# Patient Record
Sex: Female | Born: 1935 | Race: White | Hispanic: No | Smoking: Never smoker
Health system: Southern US, Community
[De-identification: ages and names within clinical notes are randomized; demographics above are authoritative.]

## PROBLEM LIST (undated history)

## (undated) DIAGNOSIS — I10 Essential (primary) hypertension: Secondary | ICD-10-CM

## (undated) DIAGNOSIS — D649 Anemia, unspecified: Secondary | ICD-10-CM

## (undated) DIAGNOSIS — M1990 Unspecified osteoarthritis, unspecified site: Secondary | ICD-10-CM

## (undated) DIAGNOSIS — L97909 Non-pressure chronic ulcer of unspecified part of unspecified lower leg with unspecified severity: Secondary | ICD-10-CM

## (undated) DIAGNOSIS — K589 Irritable bowel syndrome without diarrhea: Secondary | ICD-10-CM

## (undated) DIAGNOSIS — I70229 Atherosclerosis of native arteries of extremities with rest pain, unspecified extremity: Secondary | ICD-10-CM

## (undated) DIAGNOSIS — I998 Other disorder of circulatory system: Secondary | ICD-10-CM

## (undated) DIAGNOSIS — E079 Disorder of thyroid, unspecified: Secondary | ICD-10-CM

## (undated) DIAGNOSIS — F329 Major depressive disorder, single episode, unspecified: Secondary | ICD-10-CM

## (undated) DIAGNOSIS — G629 Polyneuropathy, unspecified: Secondary | ICD-10-CM

## (undated) DIAGNOSIS — I83009 Varicose veins of unspecified lower extremity with ulcer of unspecified site: Secondary | ICD-10-CM

## (undated) DIAGNOSIS — F32A Depression, unspecified: Secondary | ICD-10-CM

## (undated) DIAGNOSIS — R600 Localized edema: Secondary | ICD-10-CM

## (undated) HISTORY — DX: Polyneuropathy, unspecified: G62.9

## (undated) HISTORY — DX: Localized edema: R60.0

## (undated) HISTORY — PX: HIP SURGERY: SHX245

## (undated) HISTORY — DX: Atherosclerosis of native arteries of extremities with rest pain, unspecified extremity: I70.229

## (undated) HISTORY — DX: Unspecified osteoarthritis, unspecified site: M19.90

## (undated) HISTORY — PX: ABDOMINAL HYSTERECTOMY: SHX81

## (undated) HISTORY — PX: EYE SURGERY: SHX253

## (undated) HISTORY — DX: Irritable bowel syndrome, unspecified: K58.9

## (undated) HISTORY — PX: OTHER SURGICAL HISTORY: SHX169

## (undated) HISTORY — DX: Other disorder of circulatory system: I99.8

## (undated) HISTORY — DX: Anemia, unspecified: D64.9

## (undated) HISTORY — PX: NECK SURGERY: SHX720

## (undated) HISTORY — DX: Varicose veins of unspecified lower extremity with ulcer of unspecified site: L97.909

## (undated) HISTORY — DX: Varicose veins of unspecified lower extremity with ulcer of unspecified site: I83.009

---

## 2000-01-18 ENCOUNTER — Inpatient Hospital Stay (HOSPITAL_COMMUNITY): Admission: EM | Admit: 2000-01-18 | Discharge: 2000-01-19 | Payer: Self-pay | Admitting: Cardiology

## 2001-06-01 ENCOUNTER — Ambulatory Visit (HOSPITAL_COMMUNITY): Admission: RE | Admit: 2001-06-01 | Discharge: 2001-06-01 | Payer: Self-pay | Admitting: Obstetrics and Gynecology

## 2001-06-01 ENCOUNTER — Encounter: Payer: Self-pay | Admitting: Obstetrics and Gynecology

## 2001-09-15 ENCOUNTER — Other Ambulatory Visit: Admission: RE | Admit: 2001-09-15 | Discharge: 2001-09-15 | Payer: Self-pay | Admitting: Dermatology

## 2002-06-08 ENCOUNTER — Encounter: Payer: Self-pay | Admitting: Nephrology

## 2002-06-08 ENCOUNTER — Ambulatory Visit (HOSPITAL_COMMUNITY): Admission: RE | Admit: 2002-06-08 | Discharge: 2002-06-08 | Payer: Self-pay | Admitting: Nephrology

## 2003-02-26 ENCOUNTER — Ambulatory Visit (HOSPITAL_COMMUNITY): Admission: RE | Admit: 2003-02-26 | Discharge: 2003-02-26 | Payer: Self-pay | Admitting: Internal Medicine

## 2003-04-18 ENCOUNTER — Emergency Department (HOSPITAL_COMMUNITY): Admission: EM | Admit: 2003-04-18 | Discharge: 2003-04-18 | Payer: Self-pay | Admitting: Emergency Medicine

## 2003-05-04 ENCOUNTER — Ambulatory Visit (HOSPITAL_COMMUNITY): Admission: RE | Admit: 2003-05-04 | Discharge: 2003-05-04 | Payer: Self-pay | Admitting: Internal Medicine

## 2003-05-18 ENCOUNTER — Ambulatory Visit (HOSPITAL_COMMUNITY): Admission: RE | Admit: 2003-05-18 | Discharge: 2003-05-18 | Payer: Self-pay | Admitting: Internal Medicine

## 2003-08-30 ENCOUNTER — Ambulatory Visit (HOSPITAL_COMMUNITY): Admission: RE | Admit: 2003-08-30 | Discharge: 2003-08-30 | Payer: Self-pay | Admitting: Internal Medicine

## 2004-02-16 ENCOUNTER — Inpatient Hospital Stay (HOSPITAL_COMMUNITY): Admission: EM | Admit: 2004-02-16 | Discharge: 2004-02-18 | Payer: Self-pay | Admitting: Emergency Medicine

## 2004-07-20 ENCOUNTER — Inpatient Hospital Stay (HOSPITAL_COMMUNITY): Admission: EM | Admit: 2004-07-20 | Discharge: 2004-07-23 | Payer: Self-pay | Admitting: Emergency Medicine

## 2004-07-23 ENCOUNTER — Encounter: Payer: Self-pay | Admitting: Cardiology

## 2004-11-13 ENCOUNTER — Ambulatory Visit (HOSPITAL_COMMUNITY): Admission: RE | Admit: 2004-11-13 | Discharge: 2004-11-13 | Payer: Self-pay | Admitting: Nephrology

## 2004-11-28 ENCOUNTER — Ambulatory Visit (HOSPITAL_COMMUNITY): Admission: RE | Admit: 2004-11-28 | Discharge: 2004-11-28 | Payer: Self-pay | Admitting: Nephrology

## 2005-02-02 ENCOUNTER — Emergency Department (HOSPITAL_COMMUNITY): Admission: EM | Admit: 2005-02-02 | Discharge: 2005-02-02 | Payer: Self-pay | Admitting: *Deleted

## 2005-02-04 ENCOUNTER — Ambulatory Visit: Payer: Self-pay | Admitting: *Deleted

## 2005-02-12 ENCOUNTER — Ambulatory Visit: Payer: Self-pay | Admitting: *Deleted

## 2005-02-19 ENCOUNTER — Ambulatory Visit (HOSPITAL_COMMUNITY): Admission: RE | Admit: 2005-02-19 | Discharge: 2005-02-19 | Payer: Self-pay | Admitting: *Deleted

## 2005-02-19 ENCOUNTER — Ambulatory Visit (HOSPITAL_COMMUNITY): Admission: RE | Admit: 2005-02-19 | Discharge: 2005-02-19 | Payer: Self-pay | Admitting: Nephrology

## 2005-02-28 ENCOUNTER — Emergency Department (HOSPITAL_COMMUNITY): Admission: EM | Admit: 2005-02-28 | Discharge: 2005-02-28 | Payer: Self-pay | Admitting: *Deleted

## 2005-03-18 ENCOUNTER — Encounter: Admission: RE | Admit: 2005-03-18 | Discharge: 2005-03-18 | Payer: Self-pay | Admitting: Neurology

## 2005-03-26 ENCOUNTER — Ambulatory Visit: Payer: Self-pay | Admitting: *Deleted

## 2005-04-07 ENCOUNTER — Ambulatory Visit: Payer: Self-pay | Admitting: Internal Medicine

## 2005-04-30 ENCOUNTER — Ambulatory Visit: Payer: Self-pay | Admitting: Internal Medicine

## 2005-04-30 ENCOUNTER — Ambulatory Visit (HOSPITAL_COMMUNITY): Admission: RE | Admit: 2005-04-30 | Discharge: 2005-04-30 | Payer: Self-pay | Admitting: Internal Medicine

## 2005-04-30 ENCOUNTER — Encounter (INDEPENDENT_AMBULATORY_CARE_PROVIDER_SITE_OTHER): Payer: Self-pay | Admitting: Internal Medicine

## 2005-06-04 ENCOUNTER — Ambulatory Visit (HOSPITAL_COMMUNITY): Admission: RE | Admit: 2005-06-04 | Discharge: 2005-06-04 | Payer: Self-pay | Admitting: *Deleted

## 2005-06-15 ENCOUNTER — Other Ambulatory Visit: Admission: RE | Admit: 2005-06-15 | Discharge: 2005-06-15 | Payer: Self-pay | Admitting: Dermatology

## 2005-06-17 ENCOUNTER — Ambulatory Visit (HOSPITAL_COMMUNITY): Admission: RE | Admit: 2005-06-17 | Discharge: 2005-06-17 | Payer: Self-pay | Admitting: *Deleted

## 2005-07-06 ENCOUNTER — Ambulatory Visit (HOSPITAL_COMMUNITY): Admission: RE | Admit: 2005-07-06 | Discharge: 2005-07-06 | Payer: Self-pay | Admitting: *Deleted

## 2005-11-30 ENCOUNTER — Ambulatory Visit (HOSPITAL_COMMUNITY): Admission: RE | Admit: 2005-11-30 | Discharge: 2005-11-30 | Payer: Self-pay | Admitting: Nephrology

## 2005-12-03 ENCOUNTER — Ambulatory Visit: Payer: Self-pay | Admitting: Family Medicine

## 2005-12-08 ENCOUNTER — Ambulatory Visit (HOSPITAL_COMMUNITY): Admission: RE | Admit: 2005-12-08 | Discharge: 2005-12-08 | Payer: Self-pay | Admitting: Family Medicine

## 2005-12-23 ENCOUNTER — Ambulatory Visit: Payer: Self-pay | Admitting: Family Medicine

## 2006-01-07 ENCOUNTER — Ambulatory Visit: Payer: Self-pay | Admitting: Family Medicine

## 2006-01-08 ENCOUNTER — Ambulatory Visit: Payer: Self-pay | Admitting: Family Medicine

## 2006-01-12 ENCOUNTER — Ambulatory Visit (HOSPITAL_COMMUNITY): Admission: RE | Admit: 2006-01-12 | Discharge: 2006-01-12 | Payer: Self-pay | Admitting: Family Medicine

## 2006-01-12 ENCOUNTER — Ambulatory Visit: Payer: Self-pay | Admitting: Family Medicine

## 2006-02-04 ENCOUNTER — Ambulatory Visit: Payer: Self-pay | Admitting: Family Medicine

## 2006-03-04 ENCOUNTER — Ambulatory Visit: Payer: Self-pay | Admitting: Family Medicine

## 2006-04-01 ENCOUNTER — Ambulatory Visit: Payer: Self-pay | Admitting: Family Medicine

## 2006-04-29 ENCOUNTER — Ambulatory Visit: Payer: Self-pay | Admitting: Family Medicine

## 2006-05-03 ENCOUNTER — Encounter (INDEPENDENT_AMBULATORY_CARE_PROVIDER_SITE_OTHER): Payer: Self-pay | Admitting: Family Medicine

## 2006-05-12 ENCOUNTER — Encounter (INDEPENDENT_AMBULATORY_CARE_PROVIDER_SITE_OTHER): Payer: Self-pay | Admitting: Family Medicine

## 2006-05-12 LAB — CONVERTED CEMR LAB: TSH: 2.603 microintl units/mL

## 2006-06-03 ENCOUNTER — Ambulatory Visit: Payer: Self-pay | Admitting: Family Medicine

## 2006-06-03 ENCOUNTER — Ambulatory Visit (HOSPITAL_COMMUNITY): Admission: RE | Admit: 2006-06-03 | Discharge: 2006-06-03 | Payer: Self-pay | Admitting: Family Medicine

## 2006-07-01 ENCOUNTER — Ambulatory Visit: Payer: Self-pay | Admitting: Internal Medicine

## 2006-07-08 ENCOUNTER — Ambulatory Visit: Payer: Self-pay | Admitting: Family Medicine

## 2006-07-09 ENCOUNTER — Ambulatory Visit (HOSPITAL_COMMUNITY): Admission: RE | Admit: 2006-07-09 | Discharge: 2006-07-09 | Payer: Self-pay | Admitting: Internal Medicine

## 2006-07-15 ENCOUNTER — Encounter (INDEPENDENT_AMBULATORY_CARE_PROVIDER_SITE_OTHER): Payer: Self-pay | Admitting: Family Medicine

## 2006-07-15 LAB — CONVERTED CEMR LAB
RBC count: 4.02 10*6/uL
WBC, blood: 5.3 10*3/uL

## 2006-08-05 ENCOUNTER — Ambulatory Visit: Payer: Self-pay | Admitting: Family Medicine

## 2006-09-09 ENCOUNTER — Encounter (INDEPENDENT_AMBULATORY_CARE_PROVIDER_SITE_OTHER): Payer: Self-pay | Admitting: Family Medicine

## 2006-09-10 ENCOUNTER — Ambulatory Visit: Payer: Self-pay | Admitting: Internal Medicine

## 2006-09-10 ENCOUNTER — Encounter (INDEPENDENT_AMBULATORY_CARE_PROVIDER_SITE_OTHER): Payer: Self-pay | Admitting: Family Medicine

## 2006-09-14 ENCOUNTER — Encounter: Payer: Self-pay | Admitting: Family Medicine

## 2006-09-14 DIAGNOSIS — I1310 Hypertensive heart and chronic kidney disease without heart failure, with stage 1 through stage 4 chronic kidney disease, or unspecified chronic kidney disease: Secondary | ICD-10-CM

## 2006-09-14 DIAGNOSIS — I10 Essential (primary) hypertension: Secondary | ICD-10-CM | POA: Insufficient documentation

## 2006-09-14 DIAGNOSIS — H5000 Unspecified esotropia: Secondary | ICD-10-CM | POA: Insufficient documentation

## 2006-09-14 DIAGNOSIS — M810 Age-related osteoporosis without current pathological fracture: Secondary | ICD-10-CM | POA: Insufficient documentation

## 2006-09-14 DIAGNOSIS — I4891 Unspecified atrial fibrillation: Secondary | ICD-10-CM

## 2006-09-14 DIAGNOSIS — E039 Hypothyroidism, unspecified: Secondary | ICD-10-CM | POA: Insufficient documentation

## 2006-09-14 DIAGNOSIS — I2510 Atherosclerotic heart disease of native coronary artery without angina pectoris: Secondary | ICD-10-CM | POA: Insufficient documentation

## 2006-09-14 DIAGNOSIS — M4800 Spinal stenosis, site unspecified: Secondary | ICD-10-CM

## 2006-09-14 DIAGNOSIS — M545 Low back pain: Secondary | ICD-10-CM

## 2006-09-14 DIAGNOSIS — E785 Hyperlipidemia, unspecified: Secondary | ICD-10-CM | POA: Insufficient documentation

## 2006-09-14 DIAGNOSIS — H269 Unspecified cataract: Secondary | ICD-10-CM

## 2006-09-14 DIAGNOSIS — K219 Gastro-esophageal reflux disease without esophagitis: Secondary | ICD-10-CM

## 2006-09-14 DIAGNOSIS — D649 Anemia, unspecified: Secondary | ICD-10-CM

## 2006-09-14 DIAGNOSIS — K259 Gastric ulcer, unspecified as acute or chronic, without hemorrhage or perforation: Secondary | ICD-10-CM | POA: Insufficient documentation

## 2006-09-15 ENCOUNTER — Emergency Department (HOSPITAL_COMMUNITY): Admission: EM | Admit: 2006-09-15 | Discharge: 2006-09-15 | Payer: Self-pay | Admitting: Emergency Medicine

## 2006-09-22 ENCOUNTER — Ambulatory Visit: Payer: Self-pay | Admitting: Family Medicine

## 2006-09-29 ENCOUNTER — Emergency Department (HOSPITAL_COMMUNITY): Admission: EM | Admit: 2006-09-29 | Discharge: 2006-09-29 | Payer: Self-pay | Admitting: Emergency Medicine

## 2006-09-30 ENCOUNTER — Ambulatory Visit: Payer: Self-pay | Admitting: Family Medicine

## 2006-10-05 ENCOUNTER — Ambulatory Visit: Payer: Self-pay | Admitting: Family Medicine

## 2006-10-06 ENCOUNTER — Inpatient Hospital Stay: Admission: AD | Admit: 2006-10-06 | Discharge: 2006-10-21 | Payer: Self-pay | Admitting: Family Medicine

## 2006-10-06 ENCOUNTER — Encounter (INDEPENDENT_AMBULATORY_CARE_PROVIDER_SITE_OTHER): Payer: Self-pay | Admitting: Family Medicine

## 2006-10-07 ENCOUNTER — Encounter (INDEPENDENT_AMBULATORY_CARE_PROVIDER_SITE_OTHER): Payer: Self-pay | Admitting: Family Medicine

## 2006-10-12 ENCOUNTER — Ambulatory Visit (HOSPITAL_COMMUNITY): Admission: RE | Admit: 2006-10-12 | Discharge: 2006-10-12 | Payer: Self-pay | Admitting: Family Medicine

## 2006-10-12 ENCOUNTER — Ambulatory Visit: Payer: Self-pay | Admitting: Family Medicine

## 2006-10-28 ENCOUNTER — Ambulatory Visit: Payer: Self-pay | Admitting: Family Medicine

## 2006-10-29 ENCOUNTER — Ambulatory Visit: Payer: Self-pay | Admitting: Internal Medicine

## 2006-11-02 ENCOUNTER — Ambulatory Visit: Payer: Self-pay | Admitting: Family Medicine

## 2006-11-05 ENCOUNTER — Ambulatory Visit (HOSPITAL_COMMUNITY): Admission: RE | Admit: 2006-11-05 | Discharge: 2006-11-05 | Payer: Self-pay | Admitting: Family Medicine

## 2006-11-05 ENCOUNTER — Ambulatory Visit: Payer: Self-pay | Admitting: Family Medicine

## 2006-11-08 ENCOUNTER — Inpatient Hospital Stay (HOSPITAL_COMMUNITY): Admission: EM | Admit: 2006-11-08 | Discharge: 2006-11-15 | Payer: Self-pay | Admitting: Emergency Medicine

## 2006-11-08 ENCOUNTER — Ambulatory Visit: Payer: Self-pay | Admitting: Cardiology

## 2006-11-15 ENCOUNTER — Inpatient Hospital Stay: Admission: AD | Admit: 2006-11-15 | Discharge: 2006-11-25 | Payer: Self-pay | Admitting: Family Medicine

## 2006-11-15 ENCOUNTER — Encounter (INDEPENDENT_AMBULATORY_CARE_PROVIDER_SITE_OTHER): Payer: Self-pay | Admitting: Family Medicine

## 2006-11-23 ENCOUNTER — Ambulatory Visit: Payer: Self-pay | Admitting: Family Medicine

## 2006-11-25 ENCOUNTER — Inpatient Hospital Stay (HOSPITAL_COMMUNITY): Admission: EM | Admit: 2006-11-25 | Discharge: 2006-12-01 | Payer: Self-pay | Admitting: Emergency Medicine

## 2006-11-25 ENCOUNTER — Ambulatory Visit: Payer: Self-pay | Admitting: Internal Medicine

## 2006-12-01 ENCOUNTER — Inpatient Hospital Stay: Admission: AD | Admit: 2006-12-01 | Discharge: 2007-01-16 | Payer: Self-pay | Admitting: Family Medicine

## 2006-12-03 ENCOUNTER — Encounter (INDEPENDENT_AMBULATORY_CARE_PROVIDER_SITE_OTHER): Payer: Self-pay | Admitting: Family Medicine

## 2006-12-07 ENCOUNTER — Telehealth (INDEPENDENT_AMBULATORY_CARE_PROVIDER_SITE_OTHER): Payer: Self-pay | Admitting: Family Medicine

## 2006-12-15 ENCOUNTER — Ambulatory Visit: Payer: Self-pay | Admitting: Internal Medicine

## 2006-12-21 ENCOUNTER — Ambulatory Visit: Payer: Self-pay | Admitting: Family Medicine

## 2007-01-06 ENCOUNTER — Ambulatory Visit (HOSPITAL_COMMUNITY): Admission: RE | Admit: 2007-01-06 | Discharge: 2007-01-06 | Payer: Self-pay | Admitting: Family Medicine

## 2007-01-06 ENCOUNTER — Encounter (INDEPENDENT_AMBULATORY_CARE_PROVIDER_SITE_OTHER): Payer: Self-pay | Admitting: Family Medicine

## 2007-01-11 ENCOUNTER — Ambulatory Visit (HOSPITAL_COMMUNITY): Admission: RE | Admit: 2007-01-11 | Discharge: 2007-01-11 | Payer: Self-pay | Admitting: Internal Medicine

## 2007-01-11 ENCOUNTER — Encounter (INDEPENDENT_AMBULATORY_CARE_PROVIDER_SITE_OTHER): Payer: Self-pay | Admitting: Family Medicine

## 2007-01-11 ENCOUNTER — Ambulatory Visit: Payer: Self-pay | Admitting: Internal Medicine

## 2007-01-16 ENCOUNTER — Inpatient Hospital Stay (HOSPITAL_COMMUNITY): Admission: EM | Admit: 2007-01-16 | Discharge: 2007-01-18 | Payer: Self-pay | Admitting: Emergency Medicine

## 2007-01-18 ENCOUNTER — Inpatient Hospital Stay: Admission: AD | Admit: 2007-01-18 | Discharge: 2007-02-09 | Payer: Self-pay | Admitting: Family Medicine

## 2007-01-24 ENCOUNTER — Encounter (INDEPENDENT_AMBULATORY_CARE_PROVIDER_SITE_OTHER): Payer: Self-pay | Admitting: Family Medicine

## 2007-02-09 ENCOUNTER — Ambulatory Visit: Payer: Self-pay | Admitting: Pulmonary Disease

## 2007-02-09 ENCOUNTER — Inpatient Hospital Stay (HOSPITAL_COMMUNITY): Admission: RE | Admit: 2007-02-09 | Discharge: 2007-02-14 | Payer: Self-pay | Admitting: Neurosurgery

## 2007-02-14 ENCOUNTER — Inpatient Hospital Stay: Admission: AD | Admit: 2007-02-14 | Discharge: 2007-04-26 | Payer: Self-pay | Admitting: Internal Medicine

## 2007-02-28 ENCOUNTER — Ambulatory Visit (HOSPITAL_COMMUNITY): Admission: RE | Admit: 2007-02-28 | Discharge: 2007-02-28 | Payer: Self-pay | Admitting: Internal Medicine

## 2007-04-08 ENCOUNTER — Ambulatory Visit (HOSPITAL_COMMUNITY): Admission: RE | Admit: 2007-04-08 | Discharge: 2007-04-08 | Payer: Self-pay | Admitting: Internal Medicine

## 2007-04-25 ENCOUNTER — Encounter (INDEPENDENT_AMBULATORY_CARE_PROVIDER_SITE_OTHER): Payer: Self-pay | Admitting: Family Medicine

## 2007-04-26 ENCOUNTER — Encounter (INDEPENDENT_AMBULATORY_CARE_PROVIDER_SITE_OTHER): Payer: Self-pay | Admitting: Family Medicine

## 2007-05-06 ENCOUNTER — Encounter (INDEPENDENT_AMBULATORY_CARE_PROVIDER_SITE_OTHER): Payer: Self-pay | Admitting: Family Medicine

## 2007-05-09 ENCOUNTER — Ambulatory Visit: Payer: Self-pay | Admitting: Family Medicine

## 2007-05-18 ENCOUNTER — Encounter (INDEPENDENT_AMBULATORY_CARE_PROVIDER_SITE_OTHER): Payer: Self-pay | Admitting: Family Medicine

## 2007-05-19 ENCOUNTER — Telehealth (INDEPENDENT_AMBULATORY_CARE_PROVIDER_SITE_OTHER): Payer: Self-pay | Admitting: Family Medicine

## 2007-05-19 ENCOUNTER — Telehealth (INDEPENDENT_AMBULATORY_CARE_PROVIDER_SITE_OTHER): Payer: Self-pay | Admitting: *Deleted

## 2007-05-23 ENCOUNTER — Encounter (INDEPENDENT_AMBULATORY_CARE_PROVIDER_SITE_OTHER): Payer: Self-pay | Admitting: Family Medicine

## 2007-05-23 ENCOUNTER — Telehealth (INDEPENDENT_AMBULATORY_CARE_PROVIDER_SITE_OTHER): Payer: Self-pay | Admitting: *Deleted

## 2007-05-27 ENCOUNTER — Encounter (INDEPENDENT_AMBULATORY_CARE_PROVIDER_SITE_OTHER): Payer: Self-pay | Admitting: Family Medicine

## 2007-05-30 ENCOUNTER — Telehealth (INDEPENDENT_AMBULATORY_CARE_PROVIDER_SITE_OTHER): Payer: Self-pay | Admitting: Family Medicine

## 2007-05-30 ENCOUNTER — Encounter (INDEPENDENT_AMBULATORY_CARE_PROVIDER_SITE_OTHER): Payer: Self-pay | Admitting: Family Medicine

## 2007-06-06 ENCOUNTER — Ambulatory Visit: Payer: Self-pay | Admitting: Family Medicine

## 2007-06-07 ENCOUNTER — Encounter (INDEPENDENT_AMBULATORY_CARE_PROVIDER_SITE_OTHER): Payer: Self-pay | Admitting: Family Medicine

## 2007-06-09 ENCOUNTER — Encounter (INDEPENDENT_AMBULATORY_CARE_PROVIDER_SITE_OTHER): Payer: Self-pay | Admitting: Family Medicine

## 2007-06-09 ENCOUNTER — Telehealth (INDEPENDENT_AMBULATORY_CARE_PROVIDER_SITE_OTHER): Payer: Self-pay | Admitting: Family Medicine

## 2007-06-13 ENCOUNTER — Telehealth (INDEPENDENT_AMBULATORY_CARE_PROVIDER_SITE_OTHER): Payer: Self-pay | Admitting: Family Medicine

## 2007-06-17 ENCOUNTER — Telehealth (INDEPENDENT_AMBULATORY_CARE_PROVIDER_SITE_OTHER): Payer: Self-pay | Admitting: Family Medicine

## 2007-06-17 ENCOUNTER — Encounter (INDEPENDENT_AMBULATORY_CARE_PROVIDER_SITE_OTHER): Payer: Self-pay | Admitting: Family Medicine

## 2007-06-23 ENCOUNTER — Encounter (INDEPENDENT_AMBULATORY_CARE_PROVIDER_SITE_OTHER): Payer: Self-pay | Admitting: Family Medicine

## 2007-06-26 ENCOUNTER — Encounter (INDEPENDENT_AMBULATORY_CARE_PROVIDER_SITE_OTHER): Payer: Self-pay | Admitting: Family Medicine

## 2007-07-07 ENCOUNTER — Encounter (INDEPENDENT_AMBULATORY_CARE_PROVIDER_SITE_OTHER): Payer: Self-pay | Admitting: Family Medicine

## 2007-07-12 ENCOUNTER — Encounter (INDEPENDENT_AMBULATORY_CARE_PROVIDER_SITE_OTHER): Payer: Self-pay | Admitting: Family Medicine

## 2007-07-18 ENCOUNTER — Ambulatory Visit: Payer: Self-pay | Admitting: Family Medicine

## 2007-07-18 DIAGNOSIS — N184 Chronic kidney disease, stage 4 (severe): Secondary | ICD-10-CM | POA: Insufficient documentation

## 2007-07-19 ENCOUNTER — Telehealth (INDEPENDENT_AMBULATORY_CARE_PROVIDER_SITE_OTHER): Payer: Self-pay | Admitting: Family Medicine

## 2007-07-21 ENCOUNTER — Telehealth (INDEPENDENT_AMBULATORY_CARE_PROVIDER_SITE_OTHER): Payer: Self-pay | Admitting: Family Medicine

## 2007-07-22 ENCOUNTER — Encounter (INDEPENDENT_AMBULATORY_CARE_PROVIDER_SITE_OTHER): Payer: Self-pay | Admitting: Family Medicine

## 2007-07-27 ENCOUNTER — Telehealth (INDEPENDENT_AMBULATORY_CARE_PROVIDER_SITE_OTHER): Payer: Self-pay | Admitting: Family Medicine

## 2007-08-22 ENCOUNTER — Encounter (INDEPENDENT_AMBULATORY_CARE_PROVIDER_SITE_OTHER): Payer: Self-pay | Admitting: Family Medicine

## 2007-08-26 ENCOUNTER — Telehealth (INDEPENDENT_AMBULATORY_CARE_PROVIDER_SITE_OTHER): Payer: Self-pay | Admitting: Family Medicine

## 2007-09-05 ENCOUNTER — Telehealth (INDEPENDENT_AMBULATORY_CARE_PROVIDER_SITE_OTHER): Payer: Self-pay | Admitting: *Deleted

## 2007-09-05 ENCOUNTER — Ambulatory Visit: Payer: Self-pay | Admitting: Family Medicine

## 2007-09-06 ENCOUNTER — Telehealth (INDEPENDENT_AMBULATORY_CARE_PROVIDER_SITE_OTHER): Payer: Self-pay | Admitting: *Deleted

## 2007-09-06 ENCOUNTER — Encounter (INDEPENDENT_AMBULATORY_CARE_PROVIDER_SITE_OTHER): Payer: Self-pay | Admitting: Family Medicine

## 2007-09-14 ENCOUNTER — Ambulatory Visit: Payer: Self-pay | Admitting: Family Medicine

## 2007-09-15 ENCOUNTER — Encounter (INDEPENDENT_AMBULATORY_CARE_PROVIDER_SITE_OTHER): Payer: Self-pay | Admitting: Family Medicine

## 2007-09-27 ENCOUNTER — Telehealth (INDEPENDENT_AMBULATORY_CARE_PROVIDER_SITE_OTHER): Payer: Self-pay | Admitting: Family Medicine

## 2007-09-27 ENCOUNTER — Encounter (INDEPENDENT_AMBULATORY_CARE_PROVIDER_SITE_OTHER): Payer: Self-pay | Admitting: Family Medicine

## 2007-10-05 ENCOUNTER — Encounter (INDEPENDENT_AMBULATORY_CARE_PROVIDER_SITE_OTHER): Payer: Self-pay | Admitting: Family Medicine

## 2007-10-12 ENCOUNTER — Encounter (INDEPENDENT_AMBULATORY_CARE_PROVIDER_SITE_OTHER): Payer: Self-pay | Admitting: Family Medicine

## 2007-10-14 ENCOUNTER — Encounter (INDEPENDENT_AMBULATORY_CARE_PROVIDER_SITE_OTHER): Payer: Self-pay | Admitting: Family Medicine

## 2007-10-24 ENCOUNTER — Encounter (INDEPENDENT_AMBULATORY_CARE_PROVIDER_SITE_OTHER): Payer: Self-pay | Admitting: Family Medicine

## 2007-10-25 ENCOUNTER — Telehealth (INDEPENDENT_AMBULATORY_CARE_PROVIDER_SITE_OTHER): Payer: Self-pay | Admitting: Family Medicine

## 2007-11-01 ENCOUNTER — Ambulatory Visit (HOSPITAL_COMMUNITY): Admission: RE | Admit: 2007-11-01 | Discharge: 2007-11-01 | Payer: Self-pay | Admitting: *Deleted

## 2007-11-09 ENCOUNTER — Encounter (INDEPENDENT_AMBULATORY_CARE_PROVIDER_SITE_OTHER): Payer: Self-pay | Admitting: Family Medicine

## 2007-11-21 ENCOUNTER — Encounter (INDEPENDENT_AMBULATORY_CARE_PROVIDER_SITE_OTHER): Payer: Self-pay | Admitting: Family Medicine

## 2007-11-25 ENCOUNTER — Encounter (INDEPENDENT_AMBULATORY_CARE_PROVIDER_SITE_OTHER): Payer: Self-pay | Admitting: Family Medicine

## 2007-12-12 ENCOUNTER — Encounter (INDEPENDENT_AMBULATORY_CARE_PROVIDER_SITE_OTHER): Payer: Self-pay | Admitting: Family Medicine

## 2007-12-13 ENCOUNTER — Ambulatory Visit: Payer: Self-pay | Admitting: Family Medicine

## 2007-12-14 ENCOUNTER — Encounter (INDEPENDENT_AMBULATORY_CARE_PROVIDER_SITE_OTHER): Payer: Self-pay | Admitting: Family Medicine

## 2007-12-14 ENCOUNTER — Telehealth (INDEPENDENT_AMBULATORY_CARE_PROVIDER_SITE_OTHER): Payer: Self-pay | Admitting: *Deleted

## 2007-12-14 LAB — CONVERTED CEMR LAB
AST: 29 units/L (ref 0–37)
BUN: 31 mg/dL — ABNORMAL HIGH (ref 6–23)
Basophils Relative: 0 % (ref 0–1)
Calcium: 10 mg/dL (ref 8.4–10.5)
Chloride: 103 meq/L (ref 96–112)
Creatinine, Ser: 1.34 mg/dL — ABNORMAL HIGH (ref 0.40–1.20)
Eosinophils Absolute: 0 10*3/uL (ref 0.0–0.7)
Glucose, Bld: 91 mg/dL (ref 70–99)
HDL: 74 mg/dL (ref >39)
Hemoglobin: 12.5 g/dL (ref 12.0–15.0)
MCHC: 32.3 g/dL (ref 30.0–36.0)
MCV: 98.5 fL (ref 78.0–100.0)
Monocytes Absolute: 0.3 10*3/uL (ref 0.1–1.0)
Monocytes Relative: 7 % (ref 3–12)
Neutro Abs: 3.5 10*3/uL (ref 1.7–7.7)
RBC: 3.93 M/uL (ref 3.87–5.11)
TSH: 2.547 microintl units/mL (ref 0.350–5.50)
Total CHOL/HDL Ratio: 2.2
Triglycerides: 126 mg/dL (ref <150)

## 2007-12-19 ENCOUNTER — Telehealth (INDEPENDENT_AMBULATORY_CARE_PROVIDER_SITE_OTHER): Payer: Self-pay | Admitting: Family Medicine

## 2007-12-19 ENCOUNTER — Encounter (INDEPENDENT_AMBULATORY_CARE_PROVIDER_SITE_OTHER): Payer: Self-pay | Admitting: Family Medicine

## 2007-12-19 ENCOUNTER — Ambulatory Visit (HOSPITAL_COMMUNITY): Admission: RE | Admit: 2007-12-19 | Discharge: 2007-12-19 | Payer: Self-pay | Admitting: Family Medicine

## 2007-12-20 ENCOUNTER — Encounter (INDEPENDENT_AMBULATORY_CARE_PROVIDER_SITE_OTHER): Payer: Self-pay | Admitting: Family Medicine

## 2007-12-20 ENCOUNTER — Telehealth (INDEPENDENT_AMBULATORY_CARE_PROVIDER_SITE_OTHER): Payer: Self-pay | Admitting: Family Medicine

## 2007-12-27 ENCOUNTER — Ambulatory Visit: Payer: Self-pay | Admitting: Orthopedic Surgery

## 2007-12-27 ENCOUNTER — Inpatient Hospital Stay (HOSPITAL_COMMUNITY): Admission: EM | Admit: 2007-12-27 | Discharge: 2008-01-06 | Payer: Self-pay | Admitting: Emergency Medicine

## 2007-12-28 ENCOUNTER — Encounter: Payer: Self-pay | Admitting: Orthopedic Surgery

## 2007-12-28 ENCOUNTER — Telehealth (INDEPENDENT_AMBULATORY_CARE_PROVIDER_SITE_OTHER): Payer: Self-pay | Admitting: *Deleted

## 2007-12-31 ENCOUNTER — Encounter: Payer: Self-pay | Admitting: Orthopedic Surgery

## 2008-01-02 ENCOUNTER — Encounter: Payer: Self-pay | Admitting: Orthopedic Surgery

## 2008-01-03 ENCOUNTER — Encounter (INDEPENDENT_AMBULATORY_CARE_PROVIDER_SITE_OTHER): Payer: Self-pay | Admitting: Family Medicine

## 2008-01-06 ENCOUNTER — Encounter: Payer: Self-pay | Admitting: Orthopedic Surgery

## 2008-01-13 ENCOUNTER — Telehealth: Payer: Self-pay | Admitting: Orthopedic Surgery

## 2008-01-26 ENCOUNTER — Ambulatory Visit: Payer: Self-pay | Admitting: Orthopedic Surgery

## 2008-02-06 ENCOUNTER — Inpatient Hospital Stay (HOSPITAL_COMMUNITY): Admission: RE | Admit: 2008-02-06 | Discharge: 2008-02-10 | Payer: Self-pay | Admitting: Neurological Surgery

## 2008-02-07 ENCOUNTER — Ambulatory Visit: Payer: Self-pay | Admitting: Physical Medicine & Rehabilitation

## 2008-02-22 ENCOUNTER — Encounter (INDEPENDENT_AMBULATORY_CARE_PROVIDER_SITE_OTHER): Payer: Self-pay | Admitting: Family Medicine

## 2008-02-24 ENCOUNTER — Encounter (INDEPENDENT_AMBULATORY_CARE_PROVIDER_SITE_OTHER): Payer: Self-pay | Admitting: Family Medicine

## 2008-03-30 ENCOUNTER — Encounter (INDEPENDENT_AMBULATORY_CARE_PROVIDER_SITE_OTHER): Payer: Self-pay | Admitting: Family Medicine

## 2008-04-02 ENCOUNTER — Encounter (INDEPENDENT_AMBULATORY_CARE_PROVIDER_SITE_OTHER): Payer: Self-pay | Admitting: Family Medicine

## 2008-04-03 ENCOUNTER — Ambulatory Visit: Payer: Self-pay | Admitting: Family Medicine

## 2008-04-09 ENCOUNTER — Ambulatory Visit: Payer: Self-pay | Admitting: Orthopedic Surgery

## 2008-04-19 ENCOUNTER — Ambulatory Visit: Payer: Self-pay | Admitting: Family Medicine

## 2008-04-21 ENCOUNTER — Encounter (INDEPENDENT_AMBULATORY_CARE_PROVIDER_SITE_OTHER): Payer: Self-pay | Admitting: Family Medicine

## 2008-04-24 ENCOUNTER — Encounter (INDEPENDENT_AMBULATORY_CARE_PROVIDER_SITE_OTHER): Payer: Self-pay | Admitting: Family Medicine

## 2008-04-24 LAB — CONVERTED CEMR LAB
ALT: 13 units/L (ref 0–35)
AST: 27 units/L (ref 0–37)
Alkaline Phosphatase: 147 units/L — ABNORMAL HIGH (ref 39–117)
BUN: 26 mg/dL — ABNORMAL HIGH (ref 6–23)
Basophils Absolute: 0 10*3/uL (ref 0.0–0.1)
Basophils Relative: 1 % (ref 0–1)
Creatinine, Ser: 1.3 mg/dL — ABNORMAL HIGH (ref 0.40–1.20)
Eosinophils Absolute: 0 10*3/uL (ref 0.0–0.7)
Eosinophils Relative: 1 % (ref 0–5)
HCT: 37.9 % (ref 36.0–46.0)
Hemoglobin: 12.3 g/dL (ref 12.0–15.0)
MCHC: 32.5 g/dL (ref 30.0–36.0)
MCV: 99 fL (ref 78.0–100.0)
Monocytes Absolute: 0.4 10*3/uL (ref 0.1–1.0)
Monocytes Relative: 7 % (ref 3–12)
Neutro Abs: 4 10*3/uL (ref 1.7–7.7)
RDW: 14.4 % (ref 11.5–15.5)
Total Bilirubin: 0.3 mg/dL (ref 0.3–1.2)

## 2008-04-26 ENCOUNTER — Telehealth: Payer: Self-pay | Admitting: Orthopedic Surgery

## 2008-04-26 ENCOUNTER — Emergency Department (HOSPITAL_COMMUNITY): Admission: EM | Admit: 2008-04-26 | Discharge: 2008-04-26 | Payer: Self-pay | Admitting: Emergency Medicine

## 2008-04-26 ENCOUNTER — Telehealth (INDEPENDENT_AMBULATORY_CARE_PROVIDER_SITE_OTHER): Payer: Self-pay | Admitting: Family Medicine

## 2008-05-01 ENCOUNTER — Ambulatory Visit: Payer: Self-pay | Admitting: Family Medicine

## 2008-05-01 DIAGNOSIS — F341 Dysthymic disorder: Secondary | ICD-10-CM

## 2008-05-02 ENCOUNTER — Ambulatory Visit: Payer: Self-pay | Admitting: Family Medicine

## 2008-05-02 ENCOUNTER — Telehealth (INDEPENDENT_AMBULATORY_CARE_PROVIDER_SITE_OTHER): Payer: Self-pay | Admitting: *Deleted

## 2008-05-08 ENCOUNTER — Ambulatory Visit: Payer: Self-pay | Admitting: Orthopedic Surgery

## 2008-05-09 ENCOUNTER — Encounter (INDEPENDENT_AMBULATORY_CARE_PROVIDER_SITE_OTHER): Payer: Self-pay | Admitting: Family Medicine

## 2008-05-17 ENCOUNTER — Encounter (INDEPENDENT_AMBULATORY_CARE_PROVIDER_SITE_OTHER): Payer: Self-pay | Admitting: Family Medicine

## 2008-05-21 ENCOUNTER — Ambulatory Visit: Payer: Self-pay | Admitting: Family Medicine

## 2008-05-25 ENCOUNTER — Ambulatory Visit: Payer: Self-pay | Admitting: Family Medicine

## 2008-06-21 ENCOUNTER — Ambulatory Visit: Payer: Self-pay | Admitting: Family Medicine

## 2008-06-28 ENCOUNTER — Telehealth (INDEPENDENT_AMBULATORY_CARE_PROVIDER_SITE_OTHER): Payer: Self-pay | Admitting: *Deleted

## 2008-07-06 ENCOUNTER — Telehealth (INDEPENDENT_AMBULATORY_CARE_PROVIDER_SITE_OTHER): Payer: Self-pay | Admitting: Family Medicine

## 2008-07-09 ENCOUNTER — Telehealth (INDEPENDENT_AMBULATORY_CARE_PROVIDER_SITE_OTHER): Payer: Self-pay | Admitting: Family Medicine

## 2008-07-31 ENCOUNTER — Ambulatory Visit: Payer: Self-pay | Admitting: Family Medicine

## 2008-08-01 IMAGING — CR DG CHEST 2V
2 series · 2 of 2 positions shown · non-contrast
Comparison: 01/04/2008

CLINICAL DATA: Preop for cervical spine surgery.

CHEST - 2 VIEW

[view not recorded (1 of 2)]
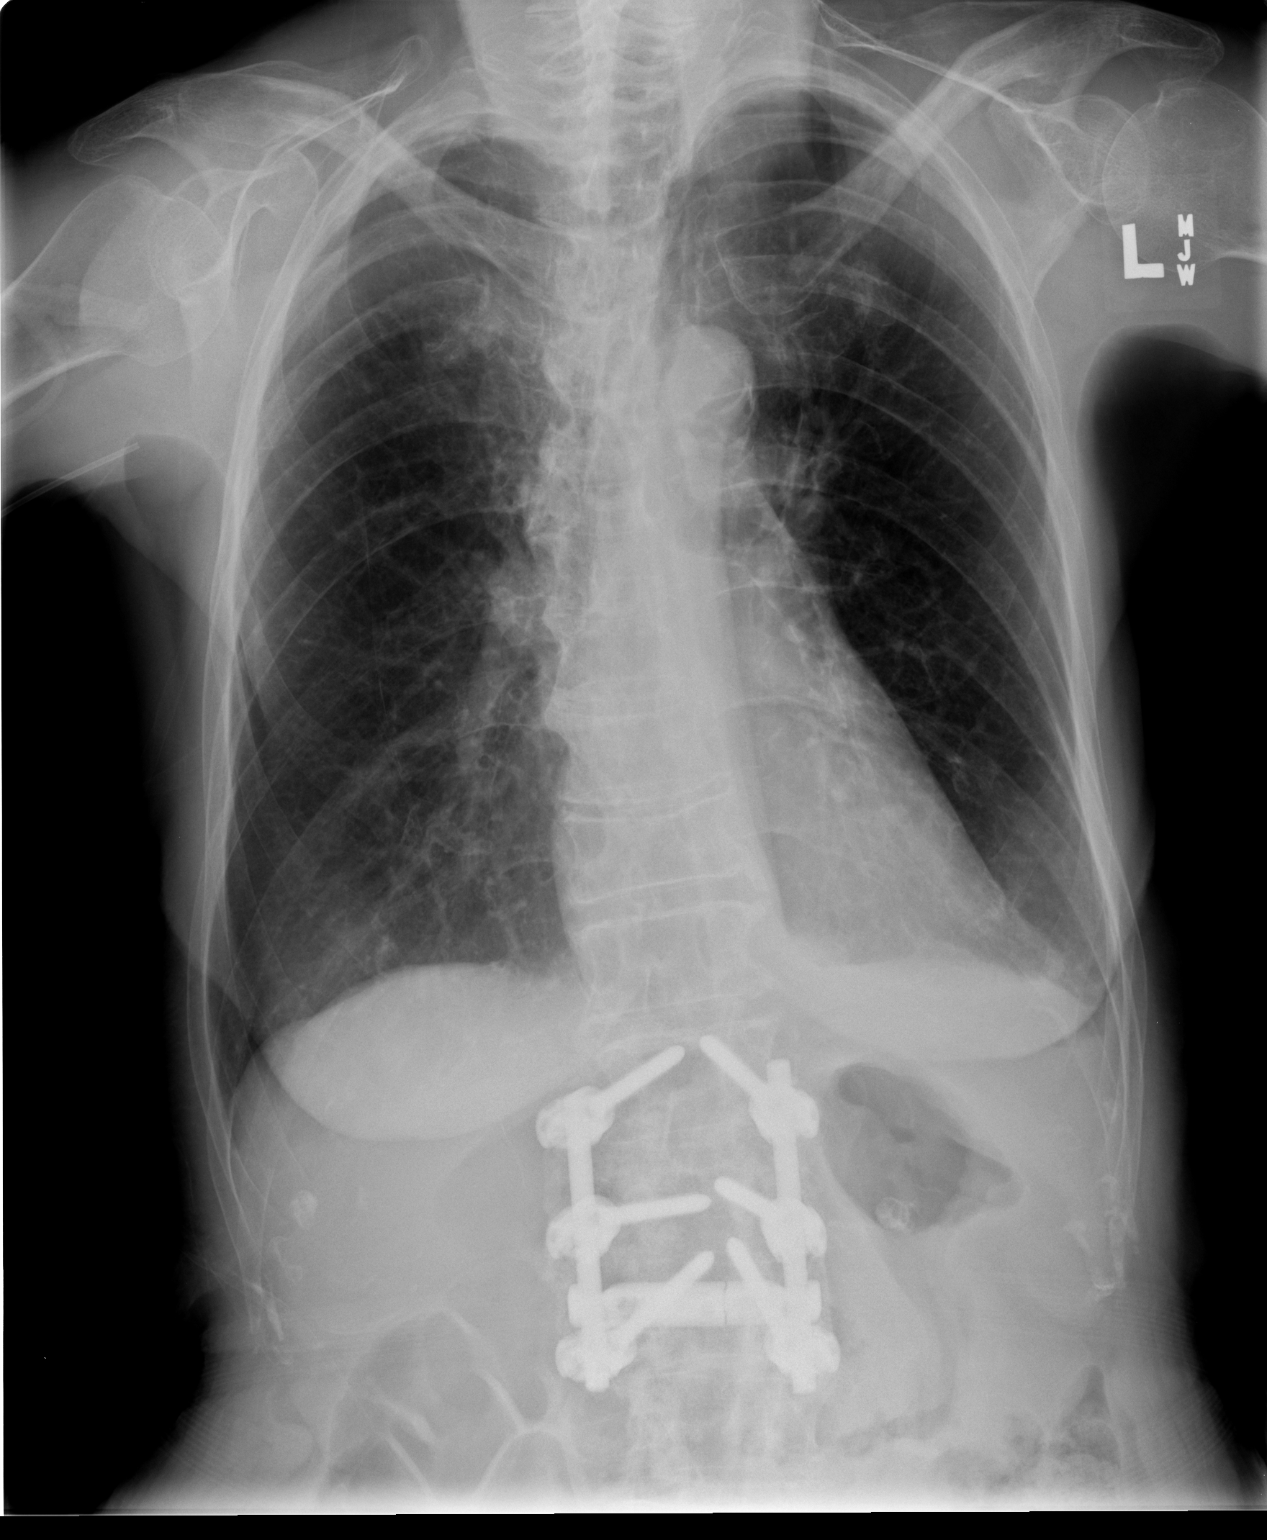

[view not recorded (2 of 2)]
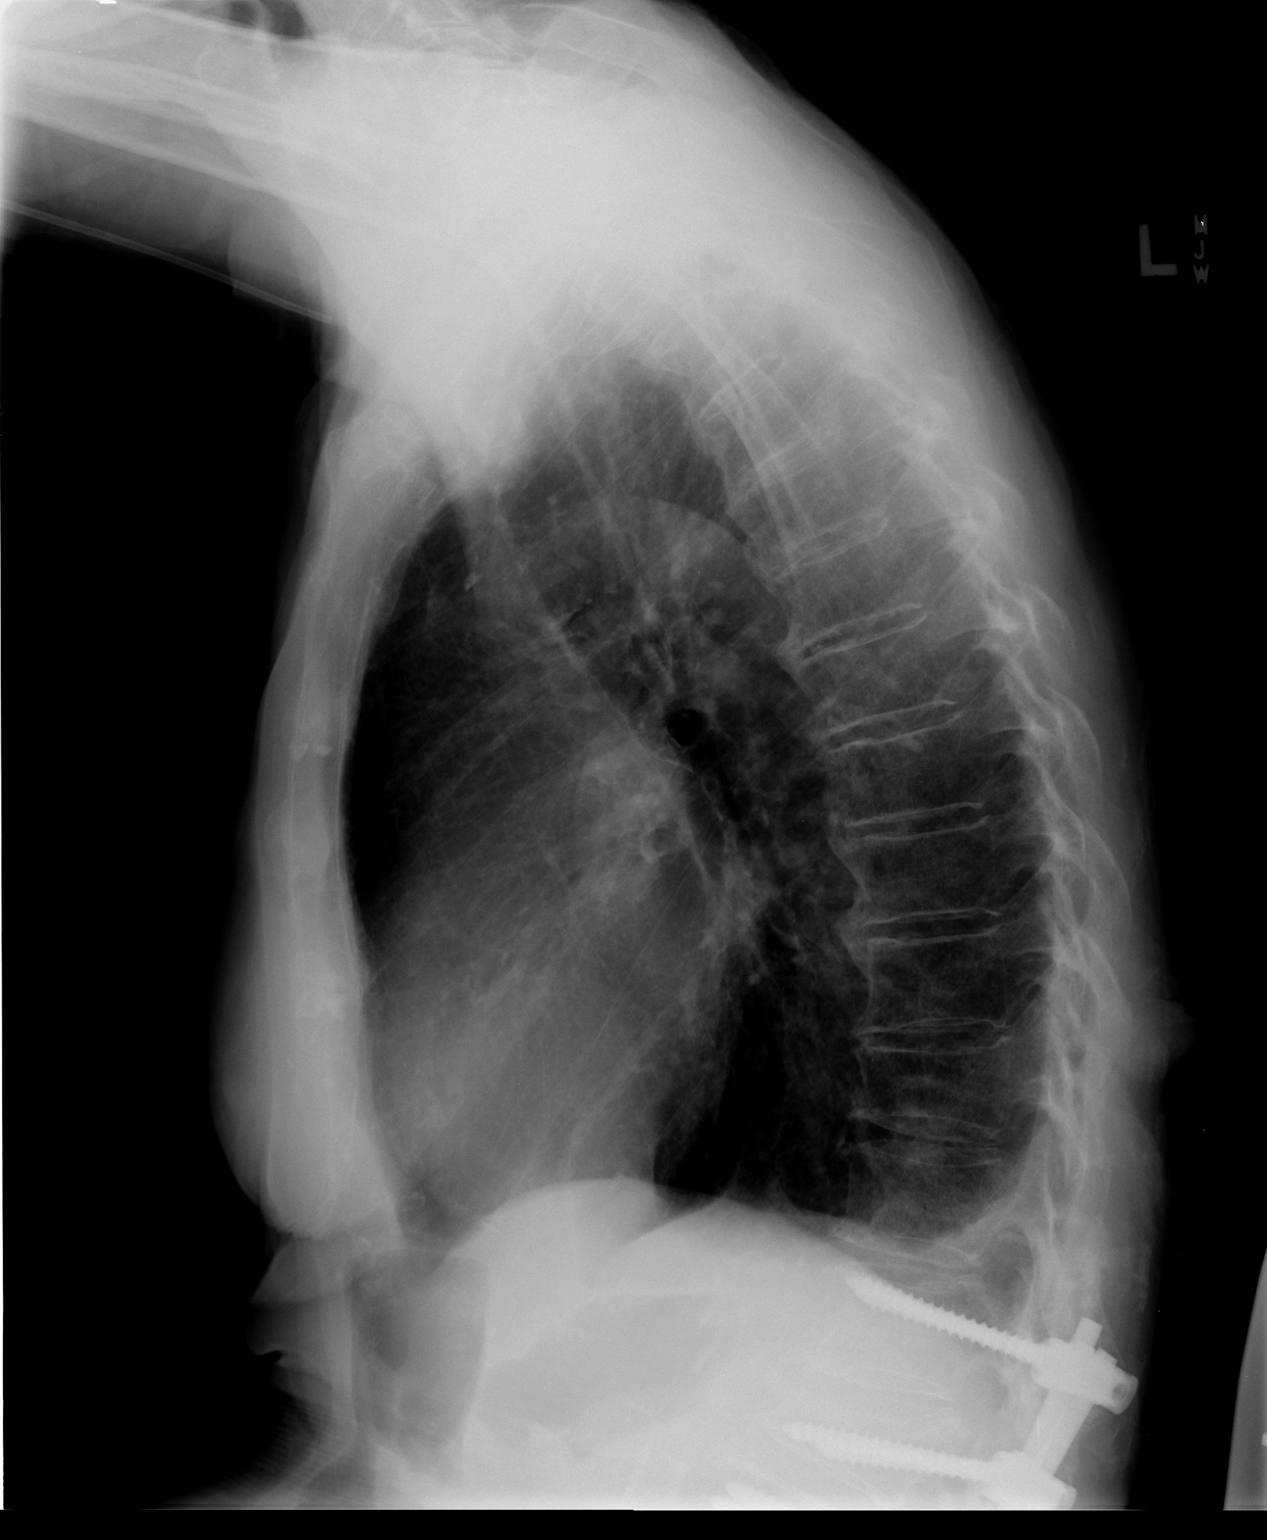

[2 of 2 positions shown; findings below may reference images not displayed]

FINDINGS: Prior thoracolumbar spine fixation.  Hyperinflation
suspicious for COPD.  Possible catheter over the right axilla.
Midline trachea.  Normal heart size and mediastinal contours.
Resolved right-sided and nearly resolved left-sided pleural
effusion.  No pneumothorax.  Resolved congestive failure.  minimal
left base atelectasis.
IMPRESSION: 1.  Marked improved aeration with minimal left base atelectasis and
small left-sided pleural effusion remaining.
2.  Underlying hyperinflation suspicious for COPD.
3.  Possible catheter over the right axilla.  Correlate with
possible PICC line placement.

## 2008-08-23 ENCOUNTER — Telehealth (INDEPENDENT_AMBULATORY_CARE_PROVIDER_SITE_OTHER): Payer: Self-pay | Admitting: Family Medicine

## 2008-10-23 ENCOUNTER — Encounter (INDEPENDENT_AMBULATORY_CARE_PROVIDER_SITE_OTHER): Payer: Self-pay | Admitting: Family Medicine

## 2008-10-30 ENCOUNTER — Ambulatory Visit: Payer: Self-pay | Admitting: Family Medicine

## 2008-11-05 ENCOUNTER — Encounter (INDEPENDENT_AMBULATORY_CARE_PROVIDER_SITE_OTHER): Payer: Self-pay | Admitting: Family Medicine

## 2008-11-21 ENCOUNTER — Encounter (INDEPENDENT_AMBULATORY_CARE_PROVIDER_SITE_OTHER): Payer: Self-pay | Admitting: Family Medicine

## 2008-11-22 LAB — CONVERTED CEMR LAB
ALT: 14 units/L (ref 0–35)
BUN: 30 mg/dL — ABNORMAL HIGH (ref 6–23)
Basophils Absolute: 0 10*3/uL (ref 0.0–0.1)
Basophils Relative: 1 % (ref 0–1)
CO2: 26 meq/L (ref 19–32)
Calcium: 10.1 mg/dL (ref 8.4–10.5)
Chloride: 103 meq/L (ref 96–112)
Cholesterol: 190 mg/dL (ref 0–200)
Creatinine, Ser: 1.17 mg/dL (ref 0.40–1.20)
Eosinophils Relative: 2 % (ref 0–5)
HCT: 39.7 % (ref 36.0–46.0)
HDL: 82 mg/dL (ref >39)
Hemoglobin: 12.2 g/dL (ref 12.0–15.0)
MCHC: 30.7 g/dL (ref 30.0–36.0)
Monocytes Absolute: 0.6 10*3/uL (ref 0.1–1.0)
Monocytes Relative: 10 % (ref 3–12)
RBC: 3.85 M/uL — ABNORMAL LOW (ref 3.87–5.11)
RDW: 13.6 % (ref 11.5–15.5)
Total Bilirubin: 0.5 mg/dL (ref 0.3–1.2)
Total CHOL/HDL Ratio: 2.3
VLDL: 17 mg/dL (ref 0–40)

## 2008-11-26 ENCOUNTER — Telehealth (INDEPENDENT_AMBULATORY_CARE_PROVIDER_SITE_OTHER): Payer: Self-pay | Admitting: Family Medicine

## 2008-12-04 ENCOUNTER — Ambulatory Visit: Payer: Self-pay | Admitting: Family Medicine

## 2008-12-04 DIAGNOSIS — I872 Venous insufficiency (chronic) (peripheral): Secondary | ICD-10-CM | POA: Insufficient documentation

## 2008-12-05 ENCOUNTER — Encounter (INDEPENDENT_AMBULATORY_CARE_PROVIDER_SITE_OTHER): Payer: Self-pay | Admitting: Family Medicine

## 2008-12-05 LAB — CONVERTED CEMR LAB: Potassium: 4.4 meq/L (ref 3.5–5.3)

## 2009-01-26 ENCOUNTER — Ambulatory Visit: Payer: Self-pay | Admitting: Orthopedic Surgery

## 2009-01-26 ENCOUNTER — Inpatient Hospital Stay (HOSPITAL_COMMUNITY): Admission: EM | Admit: 2009-01-26 | Discharge: 2009-02-01 | Payer: Self-pay | Admitting: Emergency Medicine

## 2009-01-27 ENCOUNTER — Encounter: Payer: Self-pay | Admitting: Orthopedic Surgery

## 2009-03-20 ENCOUNTER — Telehealth (INDEPENDENT_AMBULATORY_CARE_PROVIDER_SITE_OTHER): Payer: Self-pay | Admitting: Family Medicine

## 2009-03-26 ENCOUNTER — Ambulatory Visit: Payer: Self-pay | Admitting: Family Medicine

## 2009-03-27 ENCOUNTER — Encounter (INDEPENDENT_AMBULATORY_CARE_PROVIDER_SITE_OTHER): Payer: Self-pay | Admitting: Family Medicine

## 2009-03-28 LAB — CONVERTED CEMR LAB
AST: 25 units/L (ref 0–37)
Albumin: 4 g/dL (ref 3.5–5.2)
Alkaline Phosphatase: 142 units/L — ABNORMAL HIGH (ref 39–117)
BUN: 23 mg/dL (ref 6–23)
Basophils Absolute: 0 10*3/uL (ref 0.0–0.1)
Basophils Relative: 0 % (ref 0–1)
Creatinine, Ser: 1.21 mg/dL — ABNORMAL HIGH (ref 0.40–1.20)
Eosinophils Absolute: 0.1 10*3/uL (ref 0.0–0.7)
Eosinophils Relative: 1 % (ref 0–5)
Glucose, Bld: 89 mg/dL (ref 70–99)
HCT: 30.4 % — ABNORMAL LOW (ref 36.0–46.0)
Hemoglobin: 10.1 g/dL — ABNORMAL LOW (ref 12.0–15.0)
MCHC: 33.2 g/dL (ref 30.0–36.0)
MCV: 94.4 fL (ref 78.0–100.0)
Monocytes Absolute: 0.5 10*3/uL (ref 0.1–1.0)
Platelets: 250 10*3/uL (ref 150–400)
Potassium: 4.6 meq/L (ref 3.5–5.3)
RDW: 14 % (ref 11.5–15.5)
Total Bilirubin: 0.3 mg/dL (ref 0.3–1.2)

## 2009-04-09 ENCOUNTER — Encounter (INDEPENDENT_AMBULATORY_CARE_PROVIDER_SITE_OTHER): Payer: Self-pay | Admitting: Family Medicine

## 2009-04-17 ENCOUNTER — Encounter (INDEPENDENT_AMBULATORY_CARE_PROVIDER_SITE_OTHER): Payer: Self-pay | Admitting: Family Medicine

## 2009-04-23 ENCOUNTER — Ambulatory Visit: Payer: Self-pay | Admitting: Family Medicine

## 2009-04-26 ENCOUNTER — Telehealth (INDEPENDENT_AMBULATORY_CARE_PROVIDER_SITE_OTHER): Payer: Self-pay | Admitting: *Deleted

## 2009-05-01 ENCOUNTER — Encounter (INDEPENDENT_AMBULATORY_CARE_PROVIDER_SITE_OTHER): Payer: Self-pay | Admitting: Family Medicine

## 2009-05-01 ENCOUNTER — Ambulatory Visit (HOSPITAL_COMMUNITY): Admission: RE | Admit: 2009-05-01 | Discharge: 2009-05-01 | Payer: Self-pay | Admitting: Family Medicine

## 2009-05-15 ENCOUNTER — Encounter (INDEPENDENT_AMBULATORY_CARE_PROVIDER_SITE_OTHER): Payer: Self-pay | Admitting: Family Medicine

## 2009-05-24 ENCOUNTER — Ambulatory Visit: Payer: Self-pay | Admitting: Family Medicine

## 2009-06-25 ENCOUNTER — Ambulatory Visit: Payer: Self-pay | Admitting: Family Medicine

## 2009-09-12 ENCOUNTER — Ambulatory Visit (HOSPITAL_COMMUNITY): Admission: RE | Admit: 2009-09-12 | Discharge: 2009-09-12 | Payer: Self-pay | Admitting: Family Medicine

## 2009-09-30 ENCOUNTER — Ambulatory Visit (HOSPITAL_COMMUNITY): Admission: RE | Admit: 2009-09-30 | Discharge: 2009-09-30 | Payer: Self-pay | Admitting: Family Medicine

## 2009-12-27 ENCOUNTER — Ambulatory Visit: Payer: Self-pay | Admitting: Vascular Surgery

## 2010-10-17 ENCOUNTER — Emergency Department (HOSPITAL_COMMUNITY)
Admission: EM | Admit: 2010-10-17 | Discharge: 2010-10-17 | Payer: Self-pay | Source: Home / Self Care | Admitting: Emergency Medicine

## 2010-11-16 ENCOUNTER — Encounter: Payer: Self-pay | Admitting: Family Medicine

## 2010-11-17 ENCOUNTER — Encounter: Payer: Self-pay | Admitting: Family Medicine

## 2010-12-30 ENCOUNTER — Emergency Department (HOSPITAL_COMMUNITY): Payer: Medicare Other

## 2010-12-30 ENCOUNTER — Emergency Department (HOSPITAL_COMMUNITY)
Admission: EM | Admit: 2010-12-30 | Discharge: 2010-12-30 | Disposition: A | Discharge status: Home / Self Care | Payer: Medicare Other | Attending: Emergency Medicine | Admitting: Emergency Medicine

## 2010-12-30 DIAGNOSIS — R079 Chest pain, unspecified: Secondary | ICD-10-CM | POA: Insufficient documentation

## 2010-12-30 DIAGNOSIS — W19XXXA Unspecified fall, initial encounter: Secondary | ICD-10-CM | POA: Insufficient documentation

## 2010-12-30 DIAGNOSIS — Y929 Unspecified place or not applicable: Secondary | ICD-10-CM | POA: Insufficient documentation

## 2010-12-30 DIAGNOSIS — E039 Hypothyroidism, unspecified: Secondary | ICD-10-CM | POA: Insufficient documentation

## 2010-12-30 DIAGNOSIS — IMO0002 Reserved for concepts with insufficient information to code with codable children: Secondary | ICD-10-CM | POA: Insufficient documentation

## 2010-12-30 DIAGNOSIS — I4891 Unspecified atrial fibrillation: Secondary | ICD-10-CM | POA: Insufficient documentation

## 2010-12-30 DIAGNOSIS — S2239XA Fracture of one rib, unspecified side, initial encounter for closed fracture: Secondary | ICD-10-CM | POA: Insufficient documentation

## 2010-12-30 DIAGNOSIS — I10 Essential (primary) hypertension: Secondary | ICD-10-CM | POA: Insufficient documentation

## 2011-02-04 LAB — DIFFERENTIAL
Basophils Absolute: 0 10*3/uL (ref 0.0–0.1)
Basophils Relative: 0 % (ref 0–1)
Basophils Relative: 0 % (ref 0–1)
Eosinophils Absolute: 0.1 10*3/uL (ref 0.0–0.7)
Eosinophils Relative: 0 % (ref 0–5)
Eosinophils Relative: 1 % (ref 0–5)
Eosinophils Relative: 3 % (ref 0–5)
Lymphocytes Relative: 16 % (ref 12–46)
Lymphocytes Relative: 17 % (ref 12–46)
Lymphocytes Relative: 19 % (ref 12–46)
Lymphocytes Relative: 7 % — ABNORMAL LOW (ref 12–46)
Lymphs Abs: 0.7 10*3/uL (ref 0.7–4.0)
Lymphs Abs: 0.8 10*3/uL (ref 0.7–4.0)
Lymphs Abs: 0.8 10*3/uL (ref 0.7–4.0)
Monocytes Absolute: 0.3 10*3/uL (ref 0.1–1.0)
Monocytes Relative: 3 % (ref 3–12)
Monocytes Relative: 5 % (ref 3–12)
Monocytes Relative: 5 % (ref 3–12)
Neutro Abs: 2.9 10*3/uL (ref 1.7–7.7)
Neutrophils Relative %: 69 % (ref 43–77)
Neutrophils Relative %: 84 % — ABNORMAL HIGH (ref 43–77)

## 2011-02-04 LAB — IRON AND TIBC
Iron: 14 ug/dL — ABNORMAL LOW (ref 42–135)
TIBC: 205 ug/dL — ABNORMAL LOW (ref 250–470)
UIBC: 191 ug/dL

## 2011-02-04 LAB — URINALYSIS, ROUTINE W REFLEX MICROSCOPIC
Glucose, UA: NEGATIVE mg/dL
Ketones, ur: NEGATIVE mg/dL
Leukocytes, UA: NEGATIVE
pH: 6 (ref 5.0–8.0)

## 2011-02-04 LAB — BASIC METABOLIC PANEL
BUN: 14 mg/dL (ref 6–23)
BUN: 19 mg/dL (ref 6–23)
BUN: 22 mg/dL (ref 6–23)
CO2: 26 mEq/L (ref 19–32)
CO2: 28 mEq/L (ref 19–32)
Calcium: 8.2 mg/dL — ABNORMAL LOW (ref 8.4–10.5)
Calcium: 8.4 mg/dL (ref 8.4–10.5)
Calcium: 8.4 mg/dL (ref 8.4–10.5)
Chloride: 105 mEq/L (ref 96–112)
Creatinine, Ser: 0.99 mg/dL (ref 0.4–1.2)
Creatinine, Ser: 1.01 mg/dL (ref 0.4–1.2)
Creatinine, Ser: 1.16 mg/dL (ref 0.4–1.2)
GFR calc Af Amer: 54 mL/min — ABNORMAL LOW (ref >60)
GFR calc non Af Amer: 36 mL/min — ABNORMAL LOW (ref >60)
GFR calc non Af Amer: 50 mL/min — ABNORMAL LOW (ref >60)
GFR calc non Af Amer: 52 mL/min — ABNORMAL LOW (ref >60)
GFR calc non Af Amer: 53 mL/min — ABNORMAL LOW (ref >60)
Glucose, Bld: 83 mg/dL (ref 70–99)
Glucose, Bld: 96 mg/dL (ref 70–99)
Potassium: 3.8 mEq/L (ref 3.5–5.1)
Potassium: 3.8 mEq/L (ref 3.5–5.1)
Potassium: 4 mEq/L (ref 3.5–5.1)
Sodium: 137 mEq/L (ref 135–145)
Sodium: 138 mEq/L (ref 135–145)

## 2011-02-04 LAB — COMPREHENSIVE METABOLIC PANEL
ALT: 31 U/L (ref 0–35)
AST: 36 U/L (ref 0–37)
Albumin: 3.4 g/dL — ABNORMAL LOW (ref 3.5–5.2)
CO2: 29 mEq/L (ref 19–32)
Calcium: 8.9 mg/dL (ref 8.4–10.5)
Creatinine, Ser: 1.32 mg/dL — ABNORMAL HIGH (ref 0.4–1.2)
GFR calc Af Amer: 46 mL/min — ABNORMAL LOW (ref >60)
GFR calc non Af Amer: 38 mL/min — ABNORMAL LOW (ref >60)
Sodium: 139 mEq/L (ref 135–145)
Total Protein: 5.9 g/dL — ABNORMAL LOW (ref 6.0–8.3)

## 2011-02-04 LAB — CBC
HCT: 24.1 % — ABNORMAL LOW (ref 36.0–46.0)
HCT: 26.7 % — ABNORMAL LOW (ref 36.0–46.0)
Hemoglobin: 9.1 g/dL — ABNORMAL LOW (ref 12.0–15.0)
MCHC: 34.4 g/dL (ref 30.0–36.0)
MCHC: 34.4 g/dL (ref 30.0–36.0)
MCHC: 34.5 g/dL (ref 30.0–36.0)
MCV: 98.2 fL (ref 78.0–100.0)
MCV: 98.7 fL (ref 78.0–100.0)
Platelets: 146 10*3/uL — ABNORMAL LOW (ref 150–400)
Platelets: 161 10*3/uL (ref 150–400)
Platelets: 178 10*3/uL (ref 150–400)
RBC: 2.69 MIL/uL — ABNORMAL LOW (ref 3.87–5.11)
RBC: 2.85 MIL/uL — ABNORMAL LOW (ref 3.87–5.11)
RDW: 13.1 % (ref 11.5–15.5)
RDW: 13.3 % (ref 11.5–15.5)
WBC: 4.3 10*3/uL (ref 4.0–10.5)
WBC: 5.1 10*3/uL (ref 4.0–10.5)

## 2011-02-04 LAB — CULTURE, BLOOD (ROUTINE X 2)
Culture: NO GROWTH
Culture: NO GROWTH
Report Status: 4092010

## 2011-02-04 LAB — URINE MICROSCOPIC-ADD ON

## 2011-02-04 LAB — HEMOGLOBIN AND HEMATOCRIT, BLOOD
Hemoglobin: 9 g/dL — ABNORMAL LOW (ref 12.0–15.0)
Hemoglobin: 9.2 g/dL — ABNORMAL LOW (ref 12.0–15.0)

## 2011-03-10 NOTE — Consult Note (Signed)
[name], [name]                ACCOUNT NO.:  [id number]   MEDICAL RECORD NO.:  [id number]          PATIENT TYPE:  INP   LOCATION:  A339                          FACILITY:  [facility]   PHYSICIAN:  Dorris Singh, DO    DATE OF BIRTH:  21-Mar-1920   DATE OF CONSULTATION:  DATE OF DISCHARGE:                                 CONSULTATION   PRIMARY CARE PHYSICIAN:  Franchot Heidelberg, M.D.   ORTHOPEDIST:  Vickki Hearing, M.D.   We are consulted to see her regarding pneumonia.   Patient is an 77 year old female who was admitted to [facility]  Emergency Room for right hip pain.  She was found to have a fractured  right hip, and she is status post open treatment and internal fixation  of right hip for right intertrochanteric fracture.  The plan was to have  her discharged to [facility].  Prior to discharge, the patient  became short of breath.  It was determined upon chest x-ray that she had  bilateral pneumonia.  At that point in time, [facility] was consulted to  manage her.  Patient was seen today stating that she is doing fine.  She  denies any shortness of breath; however, she does have a little bit of a  cough.  I explained to her what her course was.  She stated  understanding of this situation and her current diagnosis.   PAST MEDICAL HISTORY:  1. CHF.  2. Diastolic hypertension.  3. Proximal A fib.  4. History of gastric ulcer.  5. Irritable bowel syndrome.  6. Arthritis.  7. Hypothyroidism.  8. Mild CAD.  9. Spur history.  10.Laminectomy and neurogenic claudication.   CURRENT MEDICATIONS:  1. Amlodipine 100 mg daily.  2. Catapres 0.2 mg daily.  3. Plendil 10 mg daily.  4. Neurontin 30 mg daily.  5. Levothyroxine 75 mcg daily.  6. Robaxin 500 mg p.o. q.6h.  7. Multivitamin.  8. MiraLax 17 gm p.o. daily as needed for bowel movements.  9. Her  ________ has been held due to low hemoglobin prior to      transfusion.  She is on a foot pump bilaterally.   ALLERGIES:  She is allergic to SULFA and CODEINE.  She is sensitive to  MORPHINE in terms of dosing and mental status.   FAMILY HISTORY:  Unremarkable.   REVIEW OF SYSTEMS:  Otherwise negative.   PAST MEDICAL HISTORY:  As stated above.   PHYSICAL EXAMINATION:  CURRENT VITALS:  Temperature 99.1, pulse 81,  respirations 16, blood pressure 165/67.  She is currently on 2 liters,  pulse oxing at 92%.  GENERAL:  Patient is an 77 year old woman who is thin, well-developed,  in no acute distress.  HEENT:  Head is normocephalic and atraumatic.  There is no mouth  exudate.  Turbinates are moist.  Eyes:  She does have a lateral  deviation of her left eye.  HEART:  Regular rate and rhythm.  LUNGS:  There are bilateral wheezes throughout.  ABDOMEN:  Soft, nontender, nondistended.  Positive bowel sounds.  EXTREMITIES:  Right leg is  in an immobile immobility boot.  Left with no  cyanosis or edema.   Her labs for today are as follows:  We will go ahead and order a CBC and  a CMP for today and continue to monitor her.  Also will have respiratory  manage her oxygenation status as well.   Her chest x-ray demonstrates cardiomegaly bilaterally.  AST probable  edema, superimposed infiltrates, bilateral left lower lobe cannot be  excluded, small left effusion.   ASSESSMENT/PLAN:  Pneumonia versus probably air space disease edema.  Will go ahead and get additional labs done for today and will start  patient on Levaquin as well.  She has already been started on [place].  We will start on Levaquin, thinking of pneumonia.  Will continue with GI  and DVT prophylaxis.  Will do around-the-clock breathing treatments.  Will hold off on steroids right now.  Will obtain a BNP to make sure  this is not a cardiac picture as well and will also do a 2D echo on the  patient.   Thank you for this very kind consult.      Dorris Singh, DO  Electronically Signed     CB/MEDQ  D:  01/02/2008  T:  01/02/2008   Job:  [id number]   cc:   Franchot Heidelberg, M.D.   Vickki Hearing, M.D.  Fax: [phone]

## 2011-03-10 NOTE — H&P (Signed)
Tammy, [name]                ACCOUNT NO.:  [id number]   MEDICAL RECORD NO.:  [id number]          PATIENT TYPE:  INP   LOCATION:  3106                         FACILITY:  [facility]   PHYSICIAN:  Stefani Dama, M.D.  DATE OF BIRTH:  06/26/1920   DATE OF ADMISSION:  02/06/2008  DATE OF DISCHARGE:                              HISTORY & PHYSICAL   ADMITTING DIAGNOSIS:  Cervical spondylosis with myelopathy.   HISTORY OF PRESENT ILLNESS:  [name] is an 77 year old female who  presented rather acutely back in February of this year with progressive  weakness of her upper extremities.  I had seen her at that time as Dr.  Jeral Fruit had been out of the country.  There was a concern that she was  developing weakness in all 4 extremities, and an MRI of the neck was  performed demonstrating severe spondylitic compression at C3-4, 4-5, 5-  6, and 6-7.  She noted that she had a large lumbar surgical procedure  performed by Dr. Jeral Fruit in the summer of last year for severe lumbar  spondylitic stenosis.  She gradually improved her function after that  operation but now had developed weakness in her arms.  The MRI confirmed  the presence of severe spondylitic stenosis with some cord changes back  in the areas of C3-4, and it was planned that she should have follow up  with Dr. Jeral Fruit.  In the interim, the patient suffered a fall and  fractured a hip.  This was pinned by Dr. Katrinka Blazing in [place], and  subsequent to that hospitalization, the patient developed a pneumonia.  She recovered from the pneumonia and was seen by Dr. Jeral Fruit on January 19, 2008, and he indeed recommended surgical intervention via a laminectomy  C3-C6.  The patient desired that I do the surgery, and she is now being  admitted to the hospital for this procedure.   PAST MEDICAL HISTORY:  Reveals the fact that she has significant  problems with hypertension and some COPD.   MEDICATIONS:  List of medications include:  1. Cordarone  100 mg daily.  2. Tessalon 100 mg t.i.d.  3. Catapres 0.2 mg daily.  4. Colace 100 mg b.i.d.  5. Enoxaparin 20 mg subcutaneously.  6. Plendil 10 mg daily.  7. Folic acid 2 mg p.o. daily.  8. Lasix 40 mg p.o. daily.  9. Potassium 20 mEq p.o. b.i.d.  10.Neurontin 300 mg p.o. daily.  11.Xopenex 0.63 mg inhaler q.4 h. p.r.n.  12.Levaquin 500 mg orally.  13.Levothyroxine 75 mcg p.o. daily.  14.Robaxin 500 mg q.6 h. p.r.n. muscle spasm.  15.Multivitamin.  16.Protonix 40 mg daily.  17.MiraLax 17 g per day.  18.Zocor 20 mg every other day.   SOCIAL HISTORY:  Reveals that the patient had been living alone  independently up until this point.   PHYSICAL EXAMINATION:  GENERAL:  Reveals that she is an alert, oriented,  and cooperative individual.  NEUROLOGIC:  She has 4/5 strength in the deltoid, the biceps, triceps.  The grips are 3/5 and intrinsic function is 3/5.  Sensation appears  intact to  the level of the arms; however sensation in the fingertips is  diminished in the index and lateral 3 digits on the right hand more than  the left hand.  Deep tendon reflexes are absent in the biceps and  triceps, 2+ in the patellae, 1+ in the Achilles.  Babinski's are  downgoing.  Sensation is intact in the distal lower extremities.  Cranial nerve examination reveals the pupils are 3 mm, brisk, reactive  to light and accommodation.  HEENT:  Extraocular movements are full, and the face is symmetric to  grimace.  Tongue and uvula are in the midline.  Sclerae and conjunctivae  are clear.  LUNGS:  Clear to auscultation at this time.  HEART:  Regular rate and rhythm.  No murmurs noted.  ABDOMEN:  Soft.  Bowel sounds are positive.  No masses are palpable.  EXTREMITIES:  Reveal no cyanosis, clubbing, or edema.   IMPRESSION:  The patient has evidence of a significant cervical  spondylitic myelopathy.  She is now being admitted to undergo surgical  decompression of her neck.      Stefani Dama,  M.D.  Electronically Signed     HJE/MEDQ  D:  02/06/2008  T:  02/07/2008  Job:  [id number]

## 2011-03-10 NOTE — Discharge Summary (Signed)
[name], [name]                ACCOUNT NO.:  [id number]   MEDICAL RECORD NO.:  [id number]          PATIENT TYPE:  INP   LOCATION:  A329                          FACILITY:  [facility]   PHYSICIAN:  Dorris Singh, DO    DATE OF BIRTH:  Jan 28, 1920   DATE OF ADMISSION:  01/26/2009  DATE OF DISCHARGE:  04/09/2010LH                               DISCHARGE SUMMARY   ADMISSION DIAGNOSIS:  1. Accidental fall.  2. Fracture of left superior pubic.  3. History of severe osteoporosis in legs with __________ body mass.  4. History of severe myelopathy.   DISCHARGE DIAGNOSES:  1. Suprapubic ramus fracture which is stable.  2. Anemia, chronic.  3. Severe myelopathy.  4. Severe osteoporosis.  5. Muscle spasms.  6. History of chronic obstructive pulmonary disease.   TESTING:  1. On 01/26/2009, a left hip complete which demonstrates acute left      superior pubic ramus fracture, right inferior pubic ramus fracture      medial, osteopenia of the left hip and osteoarthritis.  2. On 01/26/2009, she also had a lumbar spine which shows left      superior pubic ramus fracture, L1 to L3 PLIF across an L2      compression fracture, osteopenia spondylosis and grade 1 anterior      listhesis L5 and S1.  3. On 01/26/2009, she had one-view chest which showed stable      hyperinflation, mild cardiomegaly, no acute findings.   CONSULTS:  Dr. Fuller Canada of orthopedics who stated that the best  plan for her is bed rest, pain medication, DVT prevention, mobilization  as tolerated and weightbearing as tolerated with a walker and  recommended placement with therapeutic exercises and gait training.   HOSPITAL COURSE:  She was admitted to our service with the above  diagnoses.  Patient is an 77 year old female who was brought to the  emergency room due to complaint of an accidental fall.  She missed step,  and then she lost her footing and fell to her right side.  She hit her  right elbow against the wall.   There was no history of syncope or head  injury.  She was admitted to our service with the following diagnoses as  above.  Her pain was placed under control.  Dr. Fuller Canada saw her  and gave the following recommendation:  Physical therapy started to see  her.  There were also some issues with these muscle spasms that started  while she was beginning therapy.  She was placed on a muscle relaxant  which seemed to help.  She also was not placed on her iron while she was  here, and her hemoglobin and hematocrit had been fluctuating.  We have  been monitoring that.  At this point in time, we will start her back on  her iron and recommend that she have a CBC done in about 3 days and then  another repeat CBC done in 7 days to make sure that she is improving on  her current iron therapy.  Patient's pain was brought under control  with  morphine.  Even though she has an allergy to codeine, she did not have  any issues with it, and we will send her home on pain medication as  well.   DISCHARGE MEDICATIONS:  She will go home on the following medications:  1. Clonidine 0.1 mg daily.  We actually have decreased it to clonidine      0.1 mg daily.  2. Amiodarone 100 mg p.o. daily.  3. Fexofenadine 10 mg daily.  4. Synthroid 75 mcg daily.  5. Aspirin 81 mg daily.  6. Calcium 600 mg plus vitamin D daily.  7. Ferrous sulfate 325 mg, and we want her to take this every day.  8. Vitamin C 500 mg p.o. daily.  9. Calcitriol 0.2 mg daily.  10.Furosemide 20 mg p.o. daily.  11.Restasis 1 drop in both eyes b.i.d.  12.Multivitamin 1/2 daily.  13.Effexor 37.5 mg daily.  14.Pravastatin 40 mg daily.  15.Triamcinolone 1% apply to affected area daily.   NEW MEDICATIONS:  1. Flexeril 10 mg p.o. t.i.d. p.r.n. muscle spasm.  2. Colace 100 mg p.o. b.i.d.  3. Darvocet-N 50 p.o. q.4-6 h. p.r.n. severe pain.  4. Tylenol 650 mg p.o. q.4-6 h.   DISCHARGE PHYSICAL EXAMINATION:  GENERAL:  The patient was seen  today.  She was alert and oriented and seen with her son in the room.  VITALS:  Stable.  HEART:  Regular rate and rhythm.  LUNGS:  Clear to auscultation bilaterally.  ABDOMEN:  Soft, nontender, nondistended.  EXTREMITIES:  Positive pulses.  She has appropriate tenderness with  movement of lower extremities and also some jerking muscle spasms that  occur when patient is moving.   LABORATORIES:  02/01/2009:  Her BMET was within normal limits, and her  CBC revealed pertinent positives were hemoglobin of 8.3, but every 12  hours it has been checked, and the last one last night was 9.2 with  hematocrit of 24.1.   CONDITION ON DISCHARGE:  Stable.   DISPOSITION:  To home.  As mentioned before, for her anemia, we will go  ahead and continue with her iron and have proper followup.     Dorris Singh, DO  Electronically Signed    CB/MEDQ  D:  02/01/2009  T:  02/01/2009  Job:  [id number]

## 2011-03-10 NOTE — Discharge Summary (Signed)
[name], [name]                ACCOUNT NO.:  [id number]   MEDICAL RECORD NO.:  [id number]          PATIENT TYPE:  INP   LOCATION:  3034                         FACILITY:  [facility]   PHYSICIAN:  Stefani Dama, M.D.  DATE OF BIRTH:  Mar 19, 1920   DATE OF ADMISSION:  02/06/2008  DATE OF DISCHARGE:  02/10/2008                               DISCHARGE SUMMARY   ADMITTING DIAGNOSIS:  Cervical spondylosis with myelopathy C3-C6, status  post recent hip fracture status post pneumonia.   DISCHARGE AND FINAL DIAGNOSIS:  Cervical spondylosis with myelopathy C3-  C6, status post recent hip fracture status post pneumonia.   CONDITION ON DISCHARGE:  Improving.   HOSPITAL COURSE:  [name] is an 77 year old individual who was  noted to have significant weakness in her upper and lower extremities  developing for a number of months. She previously had significant lumbar  decompressive surgery a year ago. She was evaluated 2 months ago and  found to have severe spondylitic myelopathy. She subsequently suffered a  fall which fractured her hip.  She was hospitalized at [facility] and subsequently developed pneumonia.  She is recovered from  that and has been ambulatory.  However, she has lost considerable  function in her upper extremity secondary to spondylitic myelopathy.  She was advised regarding surgical decompression and was admitted to the  hospital on February 06, 2008 where she underwent cervical laminectomy and  decompression of the spinal cord from C3-C6.  Postoperatively she has  done fairly well.  She has required some help with mobilization but her  incision remains clean and dry. Dressing changes revealed that the  patient has no significant bleeding from the incision. All the stitches  are subcuticular and no stitches need to be removed.  The patient has  been allowed to shower.  She will continue physical therapy and  occupational therapy at the choice of her skilled  nursing facility which  is [facility]. She will be transferred there today by ambulance. The plan  will be to follow the patient up in three weeks' time.  Condition on  discharge is improving.   DISCHARGE MEDICATIONS:  Her medications at the time of discharge are  identical to what she was transferred with which include:  1. Simvastatin 20 mg daily.  2. Synthroid 75 mcg p.o. daily.  3. Protonix 40 mg daily.  4. Clonidine 0.2 mg p.o. daily.  5. Multivitamin with minerals p.o. daily.  6. Plendil 10 mg p.o. daily.  7. Folic acid 2 mg daily.  8. Lasix 40 mg p.o. daily.  9. Gabapentin 300 mg p.o. daily.  This medication may be weaned over      the next 2-3 weeks' time.  10.Docusate sodium 100 mg p.o. b.i.d.  11.Potassium chloride 40 mEq p.o. daily.  12.Amiodarone 100 mg p.o. daily.  13.MiraLax 17 grams p.o. daily p.r.n.  14.Robaxin 500 mg p.o. up to 3 times a day p.r.n. muscle spasm.  15.Ventolin inhaler q.8 h p.r.n.  16.Percocet 1 tablet p.o. q.4 h p.r.n. pain.  17.Tylenol 650 mg q.4 h p.r.n. pain.  18.Chloraseptic  spray p.r.n.  19.Cepacol lozenges p.r.n.      Stefani Dama, M.D.  Electronically Signed     HJE/MEDQ  D:  02/10/2008  T:  02/10/2008  Job:  [id number]

## 2011-03-10 NOTE — Consult Note (Signed)
NAMESHANTELLE, [name]                ACCOUNT NO.:  [id number]   MEDICAL RECORD NO.:  [id number]          PATIENT TYPE:  INP   LOCATION:  A339                          FACILITY:  [facility]   PHYSICIAN:  Edward L. Juanetta Gosling, M.D.DATE OF BIRTH:  03-Jun-1920   DATE OF CONSULTATION:  01/03/2008  DATE OF DISCHARGE:                                 CONSULTATION   REASON FOR CONSULTATION:  Pleural effusion.   HISTORY:  [name] is an 77 year old who came to the hospital for a  fractured right hip.  She had actually been set up to have neck surgery  because she had problems with both hands and it was felt that she needed  to have surgery on her neck for that.  However, she fell prior to being  able to have that done, ended up coming here with a right hip fracture.  This was treated by Dr. Romeo Apple surgically and initially she did well  and then immediately prior to discharge she developed increasing  shortness of breath, cough and congestion.  She was treated for  pneumonia.   PAST MEDICAL HISTORY:  1. Congestive heart failure.  2. Diastolic hypertension.  3. Paroxysmal atrial fibrillation.  4. Gastric ulcer.  5. Irritable bowel syndrome.  6. Arthritis.  7. Hypothyroidism.  8. Coronary artery occlusive disease.  9. Problem with bone spurs in her neck and claudication from that.   HOME MEDICATIONS:  1. Amlodipine 10 mg daily.  2. Catapres 0.2 mg daily.  3. Plendil 10 mg daily.  4. Neurontin 300 mg daily.  5. Levofloxacin 75 mcg daily.  6. Robaxin as needed.  7. Multiple vitamin as needed.   SOCIAL HISTORY:  She does not use tobacco or alcohol.  She is widowed.   FAMILY HISTORY:  Essentially noncontributory.   PHYSICAL EXAMINATION:  GENERAL:  Shows that she is awake and alert.  She  is sitting up, looks fairly comfortable.  VITAL SIGNS:  Temperature is 98.9, pulse 67, respirations 20, blood  pressure 140/57, O2 sats 93% initially on 50% now on a nasal cannula.  HEENT:  Her pupils are  reactive.  She has disconjugate gaze which is  chronic.  Her nose and throat are clear.  Her neck does not show any  jugular venous distention right now.  Her heart is regular at this  point.  Her abdomen is soft.  No masses are felt.  EXTREMITIES:  Showed no edema.  Her chest shows decreased breath sounds.  I do not hear a lot of rales now.  She had a D-dimer done yesterday that  was elevated, so she had a CT of the chest.   LABORATORY DATA:  Her white blood count 7100, hemoglobin 11, platelets  201, B-met shows BUN 19, creatinine 1.22.  BNP was 285 which is somewhat  elevated and a blood gas on room air showed pO2 of 47, pCO2 of 35, pH  7.48.   X-RAY FINDINGS:  Consistent with, I believe, more of a pulmonary edema  picture then pneumonia, although I agree that we need to treat her for  pneumonia.  CT showed.  No pulmonary emboli.  No dissection aneurysm,  etc. air space ground glass opacities with suggesting pulmonary edema  and bilateral pleural effusions.   ASSESSMENT:  She has bilateral pleural effusions, a history of  congestive heart failure, and I suspect that these are indeed related to  CHF.  She says she is feeling better.  We discussed the possibility of  thoracentesis.  She really wants to see how she does overnight, and I  think that is reasonable.  She is going to have a chest x-ray tomorrow  and also perfectly reasonable.  She is having some problems with IV  access, and she may be a candidate for a midline or PICC line and will  discuss that in the morning.  Thanks for allowing me to see her with  you.      Edward L. Juanetta Gosling, M.D.  Electronically Signed     ELH/MEDQ  D:  01/03/2008  T:  01/05/2008  Job:  [id number]

## 2011-03-10 NOTE — Group Therapy Note (Signed)
[name], [name]                ACCOUNT NO.:  [id number]   MEDICAL RECORD NO.:  [id number]          PATIENT TYPE:  INP   LOCATION:  A329                          FACILITY:  [facility]   PHYSICIAN:  Dorris Singh, DO    DATE OF BIRTH:  11/04/1919   DATE OF PROCEDURE:  01/31/2009  DATE OF DISCHARGE:                                 PROGRESS NOTE   The patient states that her pain is a little bit better today who is  still having some issues with muscle spasms.  We started her on the  muscle relaxer, which seems to be more improved.  We will go ahead and  plan on her being discharged to a nursing home facility tomorrow once we  get her pain under better control, and her hypoxia has improved.  She  has not been on any oxygen.   PHYSICAL EXAMINATION:  VITAL SIGNS:  Her temperature is 98.3, pulse 56,  respirations 20, and blood pressure 154/50.  GENERAL:  The patient is alert and oriented and in good spirits.  HEART:  Regular rate and rhythm.  LUNGS:  Clear to auscultation bilaterally.  ABDOMEN:  Soft and nontender.  EXTREMITIES:  Positive pain with movement of lower extremities with some  muscle spasms noted with movements.   LABORATORY FINDINGS:  Her labs for today are as follows:  Her white  count is 4.3, hemoglobin is 8.3, hematocrit is 23.9, and platelet count  of 146.  Sodium is 139, potassium 3.8, chloride 106, CO2 30, glucose 90,  and BUN 1.01.   ASSESSMENT:  1. Suprapubic ramus fracture, which is stable.  2. Anemia.  3. Severe osteoporosis.  4. Muscle spasms.  5. History of chronic obstructive pulmonary disease.   PLAN:  To continue to monitor the patient's blood count, and if it drops  below a point, I will plan on transfusing her, and we will continue to  monitor her, and if she continues to remain stable, we will plan on  transferring her tomorrow.      Dorris Singh, DO  Electronically Signed    CB/MEDQ  D:  01/31/2009  T:  02/01/2009  Job:  [id number]

## 2011-03-10 NOTE — Consult Note (Signed)
[name], [name]                ACCOUNT NO.:  [id number]   MEDICAL RECORD NO.:  [id number]          PATIENT TYPE:  INP   LOCATION:  A339                          FACILITY:  [facility]   PHYSICIAN:  Gardiner Barefoot, MD    DATE OF BIRTH:  [birth year]   DATE OF CONSULTATION:  DATE OF DISCHARGE:                                 CONSULTATION   PRIMARY CARE PHYSICIAN:  Dr. Franchot Heidelberg.   HISTORY OF PRESENT ILLNESS:  This is an 77 year old female with a  history of mild CAD with a coronary cath last done in September of 2005  which was significantly for 25% to 40% stenosis of her coronary arteries  and also with a history of diastolic dysfunction and CHF with an EF of  approximately 65% at that time with normal wall motion who presents here  status post a fall resulting in right intratrochanteric fracture.  The  patient reports that she was walking and stumbled over something in her  house and fell.  She denies any presynocpal episode or loss of  consciousness from the fall, did not hit her head.  She remembers the  event.  She attributes her fall to her ongoing back pain including  stenosis and spurs in her cervical spine.  She has difficulties  secondary to those with some sensation in her feet.  Patient does have a  history of surgical interventions including general anesthesia and has  never had any anesthesia complications that she reports.  Patient does  report that she walks with a cane and has no difficulty getting around  with no chest pain or shortness of breath or any difficulty climbing  stairs, although she does so at a somewhat slowed pace secondary to her  chronic pain issues.  Patient reports she has never had a heart attack,  does not have any breathing difficulties or sleep apnea, and is  otherwise in generally good health for her age.   PAST MEDICAL HISTORY:  1. CHF, diastolic.  2. Hypertension.  3. Paroxysmal A-Fib.  4. History of gastric ulcer.  5. Irritable  bowel syndrome.  6. Arthritis.  7. Hypothyroidism.  8. Mild CAD.  9. Spurs.  10.History of laminectomy.  11.Neurogenic claudication.   MEDICATIONS:  Include:  1. Amiodarone 100 mg daily.  2. Aspirin 81 mg daily.  3. Calcitriol 0.25 mcg daily.  4. Caltrate 600 + D 400 international units 3 tabs daily.  5. Centrum Silver daily.  6. _50 mg daily.  7. Felodipine ER 10 mg daily.  8. Ferrous sulfate 325 mg daily.  9. Furosemide 20 mg daily.  10.Gabapentin 300 mg daily.  11.K-Dur 20 mEq daily.  12.Lovastatin 40 mg every other day.  13.Restasis 0.05% twice daily in each eye.  14.Synthroid 75 mcg daily.  15.Tylenol PM Extra Strength q.h.s.  16.Ultram 50 mg every 4 hours as needed.  17.Vitamin C 500 one daily.  18.Vitamin D 400 international units 1 daily.  19.Fish oil 1000 mg daily.  20.Clonidine 0.1 mg daily.   ALLERGIES:  1. CODEINE.  2. SULFA.  3. LATEX.  4.  ADHESIVE.   FAMILY HISTORY:  Noncontributory.   SOCIAL HISTORY:  The patient denies any history of tobacco or alcohol,  she lives alone, was previously in the [facility] until July of 2008.   REVIEW OF SYSTEMS:  Negative except as per the history of present  illness.   PHYSICAL EXAM:  VITALS:  Temperature is 98.5, pulse is 68, respirations  18, blood pressure is 186/73.  GENERAL:  The patient is awake, alert, oriented, and appears in no acute  distress.  HEENT:  Anicteric and no throat abnormalities or enlargement.  CARDIOVASCULAR:  Regular rate and rhythm with no murmurs, rubs, or  gallops.  LUNGS:  Clear to auscultation bilaterally.  ABDOMEN:  Soft, nontender, and nondistended with positive bowel sounds  and no hepatosplenomegaly.  EXTREMITIES:  Right leg in immobility boot.  Left with no cyanosis,  clubbing, or edema.   LABORATORY AND OTHER DATA:  Chest x-ray with no acute disease, no  cardiomegaly.  EKG report shows normal sinus rhythm.   LABORATORY DATA:  CBC within normal limits, BMET with a mildly  elevated  creatinine at 1.47, BUN 27.  UA, INR 0.9, PTT is 32.   IMPRESSION:  An 77 year old female with history of coronary artery  disease and right trochanteric fracture.   RECOMMENDATIONS:  The patient's risk factors at this time for her  surgery include her age and her history of CAD though mild with no  significant lesions on a cath 4 years ago and mild CHF.  These all seem  to be compensated at this time and therefore I do not feel that there is  any significant intervention that would delay her surgery and I feel she  would be okay for surgery at any time.  She has no chest pain or other  significant suggestions of ongoing coronary disease and therefore would  not benefit from a stress test or any other interventions, she has also  had a fairly recent echocardiogram which had been unchanged from  previous ones and I do not anticipate much significant changes.  Her EKG  does not suggest any history of MI.  I discussed with the patient that  her risk for surgery would constitute intermediate risk secondary mainly  to her age, as well as her mild CAD and hypertension.  Patient voiced  her understanding of this.   I would, however, still consider a perioperative beta-blocker if her  heart remains above 65 and she remains hypertensive in addition to her  usual felodipine  medication and in fact would give a small IV dose of  beta-blocker prior to the surgery to keep her heart rate optimal.  Also,  I would continue the hydration and hold on her furosemide as her renal  insufficiency is elevated compared to her baseline which appears to be  about closer to 1.0 or 1.01.  Although, I do suspect she does have some  newer renal insufficiency so I would follow her creatinine and hold her  furosemide at this time.  Also, aspirin therapy we will defer to surgery  at the optimal time to restart;  with her age would prefer to restart  her aspirin as feasible.      Gardiner Barefoot, MD   Electronically Signed     RWC/MEDQ  D:  12/27/2007  T:  12/28/2007  Job:  [id number]

## 2011-03-10 NOTE — Group Therapy Note (Signed)
[name], [name]                ACCOUNT NO.:  [id number]   MEDICAL RECORD NO.:  [id number]          PATIENT TYPE:  INP   LOCATION:  A329                          FACILITY:  [facility]   PHYSICIAN:  Margaretmary Dys, M.D.DATE OF BIRTH:  08-Jun-1920   DATE OF PROCEDURE:  01/29/2009  DATE OF DISCHARGE:                                 PROGRESS NOTE   SUBJECTIVE:  The patient feels better.  The patient was actually moved  over to chair today and she did okay without a lot of pain, although she  still feels uncomfortable.   OBJECTIVE:  GENERAL:  Conscious, alert, comfortable, pleasant, not in  acute distress.  VITAL SIGNS:  Her blood pressure is 148/71, pulse of 77, respiration 16,  temperature 97.9 degrees Fahrenheit, oxygen saturation was 92% on room  air.  HEENT:  Normocephalic, atraumatic.  Oral mucosa was moist.  No exudates  were noted.  NECK:  Supple.  No JVD, lymphadenopathy.  LUNGS:  Reduced air entry bilaterally.  No increased crackles at the  bases.  HEART:  S1-S2 regular.  No S3, S4 gallops or rubs.  ABDOMEN:  Soft, nontender.  Bowel sounds positive.  No mass.  EXTREMITIES:  The patient is status post left hip fracture.  The patient  is unable to lay any weight on that side.   LABORATORY DATA:  White blood cell count 7.9, hemoglobin of 9.7,  hematocrit 28, platelet count was 122, basic metabolic profile was  normal.  These were labs drawn are from yesterday.   ASSESSMENT/PLAN:  1. Suprapubic ramus fracture which is stable.  2. Compression fracture of L2.  3. Severe osteoporosis.  4. Isolated fever which has resolved.  5. Mild hypoxia which etiology is unclear, but the patient does      improve as the day goes on.  6. The patient does have history of COPD, but no evidence of acute      exacerbation at this time.  7. Prior history of fever.   PLAN:  1. I will continue to monitor.  No indication for IV antibiotics at      this time.  2. Will continue physical  therapy.  3. Continue pain control medications.  4. Continue home medications.  5. The patient was seen by physical therapy, and the patient will need      a short-term nursing facility or skilled nursing facility for      rehab.  The patient otherwise remains medically stable and is      cleared at this time.      Margaretmary Dys, M.D.  Electronically Signed     AM/MEDQ  D:  01/29/2009  T:  01/29/2009  Job:  [id number]

## 2011-03-10 NOTE — Discharge Summary (Signed)
NAMEPARRISH, [name]                ACCOUNT NO.:  [id number]   MEDICAL RECORD NO.:  [id number]          PATIENT TYPE:  INP   LOCATION:  A339                          FACILITY:  [facility]   PHYSICIAN:  Vickki Hearing, M.D.DATE OF BIRTH:  06/16/1920   DATE OF ADMISSION:  12/27/2007  DATE OF DISCHARGE:  03/13/2009LH                               DISCHARGE SUMMARY   ADMITTING DIAGNOSES:  1. Right hip fracture.  2. Cervical myelopathy.   DISCHARGE DIAGNOSES:  1. Right hip fracture.  2. Cervical myelopathy.  3. Pulmonary edema.  4. Pneumonia.   OPERATIVE PROCEDURES:  Open treatment and internal fixation of the right  hip for pertrochanteric right hip fracture.  Implant used, [employer] Gamma  nail, 125 degree short Gamma nail, 35 distal locking screw, 75 mm lag  screw.   SURGEON:  Vickki Hearing, M.D.   OPERATIVE FINDINGS:  Stable three-part intertrochanteric/pertrochanteric  right hip fracture, psoas muscle contracture.   HISTORY:  This is an 77 year old female status post L1 to L3 posterior  decompression and fusion in April 2008 secondary to lumbar spine  fracture with compressive myelopathy causing loss of ambulation.  The  patient did well until December 27, 2007, when she fell at a [employer].  She fractured her right hip.  She has comorbidities of  hypertension, atrial fibrillation, which resolved spontaneously several  years ago, anxiety, gastric ulcer disease, irritable bowel syndrome,  anemia, hypothyroidism, coronary artery disease, congestive heart  failure.  Her medical conditions have been quiescent in the last 2 to 3  years.  She has had a cardiac catheterization in 2005, which showed 25%  to 40% stenosis of multiple coronary vessels.  She had an echocardiogram  in January 2008.  Her ejection fraction was 65%.   HOSPITAL COURSE:  The patient was admitted on December 27, 2007, and had  surgery on December 28, 2007.  She tolerated the surgery well.  Her  preoperative  hemoglobin was 12.  Her initial postoperative hemoglobin  was 8.2, and she was transfused.  Discharge hemoglobin is 11.0.   The patient developed shortness of breath and pulmonary edema.  A  pneumonia picture could not be ruled out, and she was started on [place]  and Levaquin.  A repeat medical consult was obtained as well as a  pulmonary consult.  At this time, it is unclear whether or not actual  pneumonia was present due to bilateral pleural effusions and infiltrate  could not be ruled out.  She was ruled out for pulmonary embolus and  ruled out for aortic dissection.  She was treated with Lasix, breathing  treatments, and supportive care and responded very well.  During this  respiratory bout, she did spike a temperature to 102.2 on January 01, 2008.  She has since defervesced and is now afebrile with stable vital signs  and a normal O2 sat of 95% on room air.   The only other complicating feature is development of right grade 1  pressure area, which is primarily a blister.   I have spoken with Dr. Danielle Dess, and last week  we planned on transferring  her on January 02, 2008,  to [facility] to get her cervical  decompression for the cervical myelopathy that she has.  She was  scheduled for surgery, I believe, on January 05, 2008; however, due to her  temperature and pulmonary edema and shortness of breath, there was no  need to transfer her.  She is now going to [facility].   MEDICATIONS:  1. Cordarone 100 mg daily.  2. Tessalon 100 mg t.i.d.  3. Catapres 0.2 mg p.o. daily.  4. Colace 100 mg p.o. b.i.d.  5. Enoxaparin 20 mg subcu every 0800 hours.  6. Plendil 10 mg p.o. daily.  7. Folic acid 2 mg p.o. daily.  8. Lasix 40 mg p.o. daily.  9. Potassium 20 mEq p.o. b.i.d.  10.Neurontin 300 mg p.o. daily.  11.Xopenex 0.63 mg inhaler q.4 hours p.r.n.  12.Levaquin 500 mg oral, stop date January 09, 2008.  13.Levothyroxine 75 mcg p.o. daily.  14.Robaxin 500 mg q.6 hours p.r.n. muscle  spasm.  15.Multivitamin 1 p.o. daily.  16.Protonix 40 mg p.o. daily __________.  17.MiraLax 17 g p.o. daily p.r.n.  18.Zocor 20 mg p.o. q.48 hours.  19.Darvocet 1 p.o. q.4 p.r.n. for pain.   Stop date for Lovenox will be April 5 or 48 hours prior to surgery for  the cervical spine.   Weightbearing status as tolerated.  Hip precautions none.   FOLLOWUP APPOINTMENT:  1. Dr. Romeo Apple in 1 month.  2. Dr. Danielle Dess, time to be determined.  3. Overall condition is improved.  Condition is stable.   ADDENDUM  The patient is allergic to CODEINE, which causes nausea and vomiting, so  this is really a reaction, ADHESIVE TAPE, LATEX, and SULFA.      Vickki Hearing, M.D.  Electronically Signed     SEH/MEDQ  D:  01/06/2008  T:  01/06/2008  Job:  [id number]   cc:   Stefani Dama, M.D.  Fax: [phone]   Vickki Hearing, M.D.  Fax: [phone]   3A

## 2011-03-10 NOTE — Group Therapy Note (Signed)
[name], [name]                ACCOUNT NO.:  [id number]   MEDICAL RECORD NO.:  [id number]          PATIENT TYPE:  INP   LOCATION:  A339                          FACILITY:  [facility]   PHYSICIAN:  Dorris Singh, DO    DATE OF BIRTH:  Jul 08, 1920   DATE OF PROCEDURE:  01/03/2008  DATE OF DISCHARGE:                                 PROGRESS NOTE   The patient was seen today resting comfortably, ran several tests on her  one and noticed that her D-dimer was elevated.  Went ahead and got a  STAT CT on her which did not show any pulmonary emboli but it however  did show that the patient has bilateral pleural effusions.  Also gave  her some Lasix.  She seems to be improved from a respiratory standpoint  and did an ABG which I reviewed and still not getting a picture of  pneumonia from the patient but will have Dr. Juanetta Gosling consult and  participate to see if he has any recommendations.   Her vitals are as follows.  The patient seen today with O2 on.  She is  currently at 6 liters nasal cannula, pulse ox being at 93%, her  temperature is 98.9, pulse is 67, respirations 20, blood pressure  140/57.  GENERAL:  The patient is a 77 year old female who was thin.  She is in  good spirits, well developed, well-nourished, in no acute distress.  HEART is regular rate and rhythm.  LUNGS:  There is bilateral wheezing throughout with crackles at the  bases.  ABDOMEN:  Soft, nontender, nondistended.  EXTREMITIES:  Right leg is immobilized, left leg no cyanosis or edema.   LABS:  Are as follows.  Her white count is 7.1, hemoglobin 11.0,  hematocrit 31.4 and her platelet count is 201.  Her D-dimer is 3.28.  Her chemistry, sodium was 140, potassium 4.0, chloride 101, CO2 30,  glucose 104, BUN 19, creatinine 1.22.  Her BNP is 185.   ASSESSMENT/PLAN:  1. Pulmonary edema.  We have ruled out PE with a CT scan.  She does      have pleural effusions.  Also, we have noticed an elevation in her      BNP so this  looks more like a possible CHF picture.  I am waiting      on the echo results that are pending.  Also I will have Dr. Juanetta Gosling      consult and participate regarding her bilateral pleural effusions      as well to see if there is any other recommendations he may have.      The patient seems to be improving.  We will continue with the      antibiotic therapy and diuretics and breathing treatments to see      how she continues to improve at this point in time.  We will change      therapy as necessary.      Dorris Singh, DO  Electronically Signed     CB/MEDQ  D:  01/03/2008  T:  01/03/2008  Job:  [id number]

## 2011-03-10 NOTE — Group Therapy Note (Signed)
NAMEADRIEANNA, BOTELER                ACCOUNT NO.:  [id number]   MEDICAL RECORD NO.:  [id number]          PATIENT TYPE:  INP   LOCATION:  A339                          FACILITY:  [facility]   PHYSICIAN:  Dorris Singh, DO    DATE OF BIRTH:  Sep 29, 1920   DATE OF PROCEDURE:  01/04/2008  DATE OF DISCHARGE:                                 PROGRESS NOTE   HISTORY:  The patient is seen today looking markedly improved.  Discussed case regarding patient with Dr. Juanetta Gosling who believes at this  time her pleural effusion will not need to be drained.  We will go ahead  and continue to monitor her, also to continue her on IV diuretics as  well.  Will also continue to cover her empirically with IV antibiotics  due to the fact that she has been hospitalized for any surgery until  discharge at which her primary care can discontinue and may or may not  decide to continue outpatient.  Discussed case with Dr. Romeo Apple,  explained to him I did not believe this was a pneumonia picture based on  her resolution with IV diuretics.  She has improved so this is more of a  fluid overload situation that has exacerbated her CHF.  Also she has had  her echocardiogram done.  Will have it read by [facility]  who apparently has seen her before in the past.   PHYSICAL EXAMINATION:  VITAL SIGNS:  Her vitals are as follows,  temperature 99.0, pulse 74, respirations 20, blood pressure 158/68.  GENERAL:  The patient generally is an 77 year old female who is in good  spirits, well-developed, well-nourished, in no acute distress.  HEART:  Regular rate and rhythm.  LUNGS:  There is bilateral wheezing with crackles improved from  yesterday.  ABDOMEN:  Soft, nontender, nondistended.  EXTREMITIES:  Right leg is immobilized.  Left leg no cyanosis or edema.   LABS:  For today are as follows, we will go ahead and repeat some basic  labs on her but her labs yesterday were within normal limits.  However,  she is anemic and  this could be due to post surgery but it is improving  on a daily basis.   ASSESSMENT AND PLAN:  Pulmonary edema.  Still agree that this is more of  a congestive heart failure picture.  She has had an echo done.  We are  waiting for it to be read.  We have ruled out PE (pulmonary embolism)  and we are monitoring her white cell count as well but will still  continue to cover her with empirical antibiotics.  Dr. Juanetta Gosling has also  seen her regarding her bilateral pleural effusion, at this point in time  does not feel like it warrants any more therapy.  Anticipate being able  to have the patient discharged in 1-2 days based on the recommendations  of the primary service of long as she continues to improve.  She will  need to remain on some type of diuretic outpatient and she can follow up  with her primary care physician once she is  discharged.     Dorris Singh, DO  Electronically Signed    CB/MEDQ  D:  01/04/2008  T:  01/04/2008  Job:  [id number]

## 2011-03-10 NOTE — H&P (Signed)
NAMESUEKO, DIMICHELE                ACCOUNT NO.:  [id number]   MEDICAL RECORD NO.:  [id number]          PATIENT TYPE:  INP   LOCATION:  A329                          FACILITY:  [facility]   PHYSICIAN:  Margaretmary Dys, M.D.DATE OF BIRTH:  10/07/1920   DATE OF ADMISSION:  01/26/2009  DATE OF DISCHARGE:  LH                              HISTORY & PHYSICAL   ADMSSION DIAGNOSES:  1. Accidental fall.  2. Fracture of the left superior pubic bone.  3. History of severe osteoporosis in a lady with a very small body      mass.  4. History of severe myelopathy.   CHIEF COMPLAINT:  Accidental fall.   HISTORY OF PRESENT ILLNESS:  Ms. Allbritton is an 77 year old female who was  brought to the emergency room due to complaints of accidental fall.  Apparently, she missed her step, had gotten close to the window when she  lost her footing and fell toward her right side.  She hit her right  elbow against the wall.  No history of syncope.  No head injury.  No  loss of consciousness.  No chest pain.  The patient also fell on her  right hip.  The patient has some abrasion to her right elbow and left  hip pain.  The patient's daughter was present and helped the patient up  as she was not able to bear weight on the left hip.  The patient had a  recent right femoral fracture in the past where pins were placed.  The  patient reports severe pain, a 6/10 when she was seen. Pain was also in  the left hip extending to the lumbar region.  The patient has some  history of falls.  The patient appears quite frail.   REVIEW OF SYSTEMS:  A 10-point review of systems otherwise negative.  The patient denies any fevers, chills or rigors.   PAST MEDICAL HISTORY:  1. Hypertension.  2. Chronic obstructive pulmonary disease.  3. Severe spondylotic compression at C3, C4, C5, C6 and C7.  4. History of  atrial fibrillation with rapid ventricular response.  5. History of iron-deficiency anemia.  6. History of peripheral  neuropathy.  7. History of hypothyroidism.  8. History of dyslipidemia.  9. Hypothyroidism.   MEDICATIONS:  1. Amiodarone 100 mg p.o. daily.  2. Aspirin 81 mg p.o. once a day.  3. Calcium carbonate 1 tablet p.o. daily.  4. Clonidine 0.1 mg p.o. once a day  5. Cyclosporin eye drops 1 drop b.i.d.  6. Lovenox 40 mg subcu once a day.  7. Plendil 10 mg p.o. daily.  8. Synthroid 75 mcg p.o. daily.   ALLERGIES:  The patient reports allergies to CODEINE AND SULFA.  She  does get nausea and vomiting; no history of anaphylaxis.   SOCIAL HISTORY:  The patient is single.  The patient has been living  independent pretty much.   FAMILY HISTORY:  Noncontributory.   PHYSICAL EXAMINATION:  The patient was conscious, alert, was very  pleasant, was not in acute distress, was well-oriented in time, place  and person.  VITAL SIGNS:  Blood pressure was 144/79, pulse 67,  respiration of 18, temperature 99 degrees Fahrenheit,  oxygen saturation  was about 92% on room air.  HEENT: Normocephalic, atraumatic.  Oral mucosa was moist with no  exudates.  NECK: Supple.  No JVD or lymphadenopathy.  Note is made that the patient  is fairly small in size.  CHEST:  Lungs were clear clinically with good air entry bilaterally.  HEART: S1-S2 regular.  No S3, S4, gallops or rubs.  ABDOMEN:  Soft, nontender.  Bowel sounds positive.  EXTREMITIES: The patient had some tenderness on the low right pelvic  bone.  Straight leg raising sign was positive.  CNS: The patient was awake, alert, following commands and was well  oriented to time, place and person.   LABORATORY/DIAGNOSTIC DATA:  White blood cell count was 11,000,  hemoglobin of 10.9, hematocrit 31.8, platelet count was 161 with 91%  neutrophils.  Sodium is 139, potassium was 4.0, chloride 103, CO2 was  29, glucose 159, BUN of 33, creatinine was 1.32.  X-ray of the lumbar  region shows a left superior pubic ramus fracture, osteopenia,  spondylosis and grade 1  anterolisthesis. There is also osteoporosis with  an L2 compression fracture.   ASSESSMENT:  This is an 77 year old female presenting to emergency room  with a mechanical fall today.  The patient is otherwise stable.   PLAN:  1. The patient will be admitted to the medical floor.  2. Will medically managed with pain control medications and try to      mobilize the patient in about 72 hours.  3. The patient likely needs medication for osteoporosis prior to      discharge  4. I have resumed all her home medications.  I do not seen any      contraindication at this time.  5. The patient will be placed on concentrated care and telemetry.  6. DVT prophylaxis will be with Lovenox.   I have discussed the above plan with the patient.  The patient says her  medications currently are helping which is morphine and she will try to  get up in a couple of days.      Margaretmary Dys, M.D.  Electronically Signed    AM/MEDQ  D:  01/26/2009  T:  01/27/2009  Job:  [id number]

## 2011-03-10 NOTE — Consult Note (Signed)
NEW PATIENT CONSULTATION   [name], [name]  DOB:  27-Mar-1920                                       12/27/2009  GEXBM#:[id number]   The patient comes in today for evaluation of ulceration of her left  lateral malleolus.  She reports this has been present for approximately  3 months.  She has been attempting self-treating this for approximately  2 months and has been seeing the [facility] for about 1  month.  She and her family members present report they feel that there  is some improvement over that month's time.  She had seen Dr. Fabienne Bruns in our office years ago, in 2005, with ulcers on her left calf.  She does have very thin skin.  She does not have any symptoms of  arterial insufficiency, essentially any claudication or other tissue  loss.   PAST HISTORY:  Multiply positive.  She does have a history of  hysterectomy, prior back surgery, prior neck surgery.  She has elevated  blood pressure and elevated cholesterol.   FAMILY HISTORY:  Negative for previous atherosclerotic disease.   SOCIAL HISTORY:  She is widowed with 2 children.  She does not smoke or  drink alcohol.   REVIEW OF SYSTEMS:  Weight is stable with no weight loss or weight gain.  She weighs 98 pounds.  She is 4 foot 11 inches tall.  CARDIAC:  Negative for pressure or tightness.  PULMONARY:  No use of home oxygen or productive cough or bronchitis.  GI:  Positive for occasional black stools.  GU:  Negative for urinary frequency.  VASCULAR:  No claudication-type symptoms.  NEUROLOGIC:  Without stroke or dizziness.  MUSCULOSKELETAL:  Positive for arthritic joint pain and muscle pain.  PSYCHIATRIC:  Negative for depression or anxiety.  HEENT:  Positive for a change in hearing.  HEMATOLOGIC:  No bleeding problems.  SKIN:  Without ulcers or rashes aside from the ulceration of her left  lateral malleolus.   PHYSICAL EXAMINATION:  Well developed, well nourished, thin, white  female appearing stated age, in no acute distress.  Her blood pressure  is 132/63, pulse 61, respirations 18.  Her saturations are 97% on room  air.  HEENT is normal.  Chest is clear.  Heart is regular rate and  rhythm.  Radial pulses are 2+.  She does have palpable femoral pulses.  I did not palpate popliteal or distal pulses.  Abdominal exam reveals no  masses, no tenderness.  Musculoskeletal:  No major deformities or  cyanosis.  Neurologic:  No focal weakness or paresthesias.  Skin:  She  does have hemosiderin deposits from her knees distally bilaterally and  does have a 1 cm clean-based open ulceration of her left outer  malleolus.   I reviewed her noninvasive vascular studies from [facility].  This reveals an ankle-arm index of 0.9 bilaterally.  I suspect that  these are falsely elevated due to the waveforms being monophasic.  I had  a long discussion with the patient and her family present.  I feel that  she, in all likelihood, does have adequate flow to heal these.  I have  recommended continued close observation as is being done with [facility].  She will continue her wound care and we will see  her again should she develop  any evidence of progressive changes in her  chronic wound.  She would be a candidate for arteriography for further  evaluation but does appear to have more distal than proximal disease  making her most likely require major bypass for improvement of blood  flow.  We will see her on an as needed basis if she has progressive  changes of her wound.     Larina Earthly, M.D.  Electronically Signed   TFE/MEDQ  D:  12/27/2009  T:  12/30/2009  Job:  [id number]   cc:   Mila Homer. Sudie Bailey, M.D.  Harold A. Tanda Rockers, M.D.

## 2011-03-10 NOTE — Group Therapy Note (Signed)
[name], [name]                ACCOUNT NO.:  [id number]   MEDICAL RECORD NO.:  [id number]          PATIENT TYPE:  INP   LOCATION:  A329                          FACILITY:  [facility]   PHYSICIAN:  Dorris Singh, DO    DATE OF BIRTH:  03/27/1920   DATE OF PROCEDURE:  DATE OF DISCHARGE:                                 PROGRESS NOTE   Patient seen today stating that she is having a lot of muscle spasm.  She had muscle spasm before and physical therapy actually mentioned  this, that they were having a difficult time with her through therapy  because of these muscle spasms during therapy that apparently are  keeping her from being able to sleep.  She is asking for some sleep aids  and to help with her muscle spasms.   Her vitals are as follows:  Temperature 97.3, pulse 77, respirations 14,  blood pressure 148/59.  GENERAL:  She is well-developed, well-nourished,  in no acute distress.  HEART:  Regular rate and rhythm.  LUNGS:  Clear to auscultation bilaterally.  ABDOMEN:  Soft, nontender, nondistended.   LABORATORIES FOR TODAY:  Sodium 137, potassium 3.3, chloride 104, CO2  28, glucose 96, BUN 14, and creatinine 0.99.   ASSESSMENT AND PLAN:  1. Suprapubic ramus fracture which is stable, compression fracture of      L2.  2. Severe osteoporosis.  3. Muscle spasms.  4. History of hypoxia during admission, which is improving, and a      history of chronic obstructive pulmonary disease, which is not      exacerbated at this time.   We will continue to monitor her with the muscle spasms and start some  Flexeril to see how she responds and are awaiting SNF placement.      Dorris Singh, DO  Electronically Signed     CB/MEDQ  D:  01/30/2009  T:  01/30/2009  Job:  [id number]

## 2011-03-10 NOTE — Op Note (Signed)
[name], [name]                ACCOUNT NO.:  [id number]   MEDICAL RECORD NO.:  [id number]          PATIENT TYPE:  INP   LOCATION:  3106                         FACILITY:  [facility]   PHYSICIAN:  Stefani Dama, M.D.  DATE OF BIRTH:  February 19, 1920   DATE OF PROCEDURE:  02/06/2008  DATE OF DISCHARGE:                               OPERATIVE REPORT   PREOPERATIVE DIAGNOSIS:  Cervical spondylosis with myelopathy.   POSTOPERATIVE DIAGNOSIS:  Cervical spondylosis with myelopathy.   PROCEDURE:  Cervical laminectomy and decompression of spinal cord from  C3-C6 inclusive undercutting of C7.   SURGEON:  Stefani Dama, MD   FIRST ASSISTANT:  Hewitt Shorts, MD   ANESTHESIA:  General endotracheal.   INDICATIONS:  [name] is an 77 year old white female who apparently  had developed weakness about 2 months ago.  This was featured by  weakness in her upper extremities and also some weakness in her lower  extremities.  She had an urgent MRI of the cervical spine which  demonstrated severe spondylitic myelopathy with what appeared to be some  hyperintensity at the level of C3-C4.  I had seen the patient in Dr.  Cassandria Santee absence and advised that ultimately she may need to consider  surgical intervention, particularly in light of the fact that she was  losing function.  Subsequent to her visit, she sustained a fall and  fractured the hip and was hospitalized in [facility].  She  underwent surgical pinning of the hip, but postoperatively had  pneumonia.  She has recovered from that and was seen by Dr. Jeral Fruit few  weeks ago and it was felt that she still needed to consider surgical  decompressive surgery.  She is now being admitted for this procedure.   PROCEDURE:  The patient was brought to the operating room supine on the  stretcher after smooth induction of general endotracheal anesthesia.  She was turned prone.  The back of the neck was prepped with alcohol and  DuraPrep,  draped in sterile fashion.  A midline incision was created and  carried down to the cervical dorsal fascia which was opened on either  side of midline to expose ultimately the spinous process of C2, which  was easily palpable superiorly and down the C5-C6 at the inferior margin  of the incision.  Localizing radiograph was taken to identify positively  C2 and C5, and once this was performed, the subperiosteal dissection was  performed so as to include entirety of C6 up to C3.  Self-retaining  cerebellar retractors were used to maintain distraction.  Hemostasis in  the soft tissues was obtained meticulously with a bipolar and monopolar  cautery, and laminectomy of C6, C5, C4, and C3 was completed inclusive  of all the spinous process and laminar arches out to the medial wall of  the facet joints.  This was done in a very careful meticulous fashion  using a high-speed bur to thin down the posterior edges of the lamina,  and then a 1 and 2 mm Kerrison punch to initially break through the  remnant of laminar  bone that was keeping the stenosis taut, and then  gradually widening the laminectomy to allow for an ample decompression  of the central spinal canal.  After each of the sequence was completed,  the lateral areas were carefully palpated and some redundant ligamentous  material was removed from the area of the foramen.  Once this was  completed, hemostasis in the resection bed was performed using  combination of some bipolar cauteries and small pledgets of Gelfoam  soaked in thrombin which were later irrigated away.  In the end, the  canal was well decompressed centrally from C3 down to C7.  The superior  rim of C7 was undercut with a 2 mm Kerrison punch carefully removing the  superior edge of bone so as to allow for a ample decompression of  central canal. With this done, the retractors were removed and after  irrigating copiously with antibiotic irrigating solution the deep muscle  layer  was closed with several #1 Vicryl sutures loosely reapproximated,  then 2-0 Vicryl was used in the cervical dorsal fascia, and 2-0 Vicryl  was used in the subcuticular tissue and then ultimately a final closure  layer of 3-0 Vicryl was used to close the subcuticular skin.  A dry  sterile dressing was applied to the patient's neck.  Blood loss was  estimated at less than 100 mL.  The patient tolerated the procedure well  and was returned to recovery in stable condition.      Stefani Dama, M.D.  Electronically Signed     HJE/MEDQ  D:  02/06/2008  T:  02/07/2008  Job:  [id number]

## 2011-03-10 NOTE — Discharge Summary (Signed)
[name], [name]                ACCOUNT NO.:  [id number]   MEDICAL RECORD NO.:  [id number]          PATIENT TYPE:  INP   LOCATION:  A339                          FACILITY:  [facility]   PHYSICIAN:  Vickki Hearing, M.D.DATE OF BIRTH:  [birth year]   DATE OF ADMISSION:  12/27/2007  DATE OF DISCHARGE:  03/09/2009LH                               DISCHARGE SUMMARY   ADMITTING DIAGNOSIS:  Fractured right hip.   DISCHARGE DIAGNOSIS:  Fractured right hip.   OPERATIVE PROCEDURE:  Open treatment and internal fixation, right hip,  for right intertrochanteric fracture.   IMPLANT:  Striker gamma nail.   COMORBIDITIES:  She has cervical myelopathy and is scheduled for fusion  by Dr. Jeral Fruit the week of March 9.  She has hypertension, history of  atrial fibrillation, though it has been quiescent, history of multiple  joint arthritis, history of anxiety, gastric ulcer disease, irritable  bowel syndrome, chronic anemia, hypothyroidism, coronary artery disease,  congestive heart failure.  Note:  Medical conditions quiescent last 2-3  years.  She had an echocardiogram in 2008 and a cardiac catheterization  in 2005.  She does have some proximal vessel stenosis ranging from 25%  to 40%.  In 2008, her ejection fraction was 65%.   ALLERGIES:  She is allergic to SULFA and CODEINE.  She sensitive to  MORPHINE in terms of altering her mental status, but no allergy. She is  allergic to ADHESIVE TAPE and there is mention of a LATEX ALLERGY,  though not confirmed.   HOSPITAL COURSE AND HISTORY:  This 77 year old female is also status  post an L1-L3 posterior decompression and fusion by Dr. Jeral Fruit, April  2008, secondary to a fracture and stenosis with pressure on the spinal  cord; however, on December 27, 2007, she fell in a [employer] and  fractured her right hip.   In the hospital, she was seen by medical consult, hospitalists, Dr.  Luciana Axe.  She had surgery on December 28, 2007 under spinal with a  minimal  blood loss; she tolerated that well.  In her postoperative course, she  had some postoperative anemia with a hemoglobin that dropped to 8.2 from  a preop of 12..  She got 2 units of blood and hemoglobin is now 10.4.  Her creatinine on admission was 1.47 and is now 1.18.   Other problems postoperatively, she has been basically bed to chair with  significant weakness in her lower extremities.  She has had some neck  pain and weakness in the upper extremities which has been persistent  throughout.  She has not been able to ambulate with any significant  progress.   MEDICINES:  1. Amiodarone 100 mg daily.  2. Catapres 0.2 mg daily.  3. Plendil 10 mg daily.  4. Neurontin 300 mg daily.  5. Levothyroxine 75 mcg daily.  6. Robaxin 500 mg p.o. q.6 h.  7. One multivitamin.  8. MiraLax 17 g p.o. daily as needed for bowel movements.  9. Her enoxaparin has been held due to her low hemoglobin prior to      transfusion and she is  on the foot pump bilaterally.   INSTRUCTIONS FOR THE HIP:  The patient is weightbearing as tolerated on  the right hip.  A radiograph was done on December 31 2007, postop, which  showed adequate fixation, position and alignment of the fracture.  Her  staples can come out on March 14.   Radiograph due in 1 month.   Weightbearing status as tolerated.   No specific hip precautions.   TODAY'S VITAL SIGNS:  T-max was 102.2, which is really the first  temperature over 101.5 that she has had.  Her O2 SAT on room air was 88%  and a chest x-ray will be done this morning.  She is not on any  antibiotic.  Addendum will be made to the chest x-ray.   The patient will be transferred for direct admit to [facility] under direction of Dr. Jeral Fruit.      Vickki Hearing, M.D.  Electronically Signed     SEH/MEDQ  D:  01/02/2008  T:  01/02/2008  Job:  [id number]

## 2011-03-10 NOTE — Group Therapy Note (Signed)
[name], [name]                ACCOUNT NO.:  [id number]   MEDICAL RECORD NO.:  [id number]          PATIENT TYPE:  INP   LOCATION:  A329                          FACILITY:  [facility]   PHYSICIAN:  Margaretmary Dys, M.D.DATE OF BIRTH:  09-Oct-1920   DATE OF PROCEDURE:  01/27/2009  DATE OF DISCHARGE:                                 PROGRESS NOTE   SUBJECTIVE:  The patient feels a little better today.  She says that the  pain is manageable, although, she has not gotten up yet, so she does not  know what will happen once she begins to mobilize.  The patient had an  isolated temperature spike of 102.3 yesterday.  Cultures have been drawn  and they were negative.  The patient is otherwise hemodynamically  stable.  No source of infection at this time.  I agreed to hold any  antibiotics.   OBJECTIVE:  GENERAL:  Conscious, alert, comfortable, pleasant and not in  acute distress.  Was well-oriented in time, place and person.  VITAL SIGNS:  Blood pressure 154/59, pulse 67, respirations 18,  temperature 98.1 degrees Fahrenheit.  Oxygen saturation was 96% on room  air.  HEENT:  Normocephalic, atraumatic.  Oral mucosa was moist with no  exudate.  NECK:  Supple, no jugular venous distension, no lymphadenopathy.  LUNGS:  Clear with good air entry bilaterally.  HEART:  S1-S2, regular.  No S3, gallops or rubs.  ABDOMEN:  Soft, nontender, bowel sounds positive.  EXTREMITIES:  Mild tenderness in both hips  CNS:  Examination was grossly intact, nonfocal neurologic examination.   LABORATORY DATA:  White blood cell count was 5.1, hemoglobin 9.1,  hematocrit of 26.7, platelet count 120 with no left shift, sodium 138,  potassium 4.0, chloride of 106, CO2 29, BUN 28, creatinine 1.38, blood  glucose 111.  Urinalysis only shows large blood, otherwise, was  unremarkable.  Blood cultures are still pending.  Chest x-ray shows some  chronic changes, no acute findings except for some mild cardiomegaly.   ASSESSMENT:  1. History of mechanical fall, accidental, yesterday.  2. Fracture of the left superior pubic ramus.  3. History of severe osteoporosis in a lady with a very small body      mass index.  4. History of severe myelopathy.  5. Isolated fever yesterday.  6. Chronic obstructive pulmonary disease, stable with no evidence of      exacerbation.  7. History of atrial fibrillation, currently sinus.   PLAN:  1. We will continue to monitor.  I agreed to hold any antibiotics for      now.  No clear source of infection.  2. We will begin physical therapy tomorrow.  3. Continue pain control medications.  4. Continue all her home medications at this time.  5. Will await physical therapy evaluation regarding the patient's      disposition.  6. Continue with DVT prophylaxis.      Margaretmary Dys, M.D.  Electronically Signed     AM/MEDQ  D:  01/27/2009  T:  01/27/2009  Job:  [id number]

## 2011-03-10 NOTE — Op Note (Signed)
NAMELUDEAN, [name]                ACCOUNT NO.:  [id number]   MEDICAL RECORD NO.:  [id number]          PATIENT TYPE:  INP   LOCATION:  A339                          FACILITY:  [facility]   PHYSICIAN:  Vickki Hearing, M.D.DATE OF BIRTH:  [birth year]   DATE OF PROCEDURE:  12/28/2007  DATE OF DISCHARGE:                               OPERATIVE REPORT   This 77 year old female fell December 27, 2007 and sustained a right  pertrochanteric three-part fracture.  She was admitted, placed in  traction.  Preoperative evaluation was done.  Medical consult obtained.  The patient was cleared for surgery.  The patient consented for open  treatment and internal fixation with gamma nail.   PREOPERATIVE DIAGNOSIS:  Pertrochanteric fracture right hip.   POSTOPERATIVE DIAGNOSES:  Pertrochanteric fracture right hip.   PROCEDURE:  Open treatment and internal fixation with gamma nail.  Implant size:  125-degree short gamma nail, 35 mm distal locking screw,  75 mm lag screw, proximal acorn.   SURGEON:  Vickki Hearing, M.D.   ASSISTANT:  Valda Lamb.   OPERATIVE FINDINGS:  Stable three-part intertrochanteric,  pertrochanteric tear fracture, with small lesser trochanteric fracture.  Also noted are significant flexion contractures of both hips, with psoas  muscle being very tight.   DETAILS OF PROCEDURE:  The patient was identified in the preop holding  area.  The right hip was marked for surgery.  Countersigned by me, the  surgeon the patient was taken to surgery and given a spinal anesthetic.  She then had 1 g of Ancef IV.  She was placed on the fracture table.  Her left leg was placed in a well-leg holder and padded.  Her right leg  was placed in traction and manipulated into position until a stable  enough reduction was obtained.  We used the C-arm for AP and lateral x-  rays.   After sterile prep and drape, time-out procedure was completed, and  confirmed procedure as stated.   The  proximal incision was made over the greater trochanter and extended  proximally.  Subcutaneous tissue divided.  Fascia split in line with the  skin incision.  Curved awl used to insert into the tip of the  trochanter, and passed into the femoral canal down to the level lesser  trochanter.  Guidewire passed.  All removed.  Sleeve passed over the  guidewire.  Then, a cannulated reamer was passed down to the level of  lesser trochanter.  Nail was then passed.   Radiographs confirmed position of the nail.   Lateral incision was made for the lag screw.  This incision was carried  down to bone.  Cannulated sleeve was passed down to bone, and the  guidewire was passed into the femoral head, into the subchondral bone on  AP and lateral x-ray, with a good tip-to-apex distance calculated   The guidewire was measured and then overreamed 75 mm.  A 75 mm lag screw  was then passed, with good subchondral interdigitation.   Proximal acorn was placed.  Lag screw was then checked for rotation.  There was no rotational instability.  A second distal incision was made.  Cannulated drill sleeve was passed  to bone.  Drill bit was passed across the distal screw hole.  A 35 mm  locking bolt was placed.   Throughout the procedure, radiographs were obtained to check position of  the implants.  I was happy with the implant position and fracture  apposition.  Good tip-to-apex distance was calculated.  Good fracture  alignment was noted, with good proximal and distal contact between the  two main fracture fragments.   The wounds were then irrigated and closed with 0 Monocryl in layered  fashion proximally, staples distally.  The proximal wound was injected  with 30 mL of Marcaine with epinephrine.   The patient was taken to the recovery room after application of a  sterile dressing.  She was in stable condition.   She will be weightbearing as tolerated starting tomorrow with bed to  chair, and then  progressive ambulation.  We can start bed search for  rehabilitation.      Vickki Hearing, M.D.  Electronically Signed     SEH/MEDQ  D:  12/28/2007  T:  12/29/2007  Job:  [id number]

## 2011-03-10 NOTE — Group Therapy Note (Signed)
NAMEMONIFAH, FREEHLING                ACCOUNT NO.:  [id number]   MEDICAL RECORD NO.:  [id number]          PATIENT TYPE:  INP   LOCATION:  A339                          FACILITY:  [facility]   PHYSICIAN:  Edward L. Juanetta Gosling, M.D.DATE OF BIRTH:  October 02, 1920   DATE OF PROCEDURE:  DATE OF DISCHARGE:                                 PROGRESS NOTE   Ms. Pfahler is overall a little better.  I think.  She has had some  problems with IV access.  Her physical examination today shows her  temperature is 99, pulse 74, respirations 20, blood pressure 158/68, O2  sat 96% on 4 liters.  Her chest is clearer than it was. Her heart is  regular.  She has had a chest x-ray which shows no significant change.   ASSESSMENT:  I think she is better, although her chest x-ray does not  show it and I would plan to go ahead and have her continue treatments to  see if we can diurese her some more. Because of the IV access, will need  either a midline or PICC line.  I have discussed situation with Dr.  Elige Radon.      Edward L. Juanetta Gosling, M.D.  Electronically Signed     ELH/MEDQ  D:  01/04/2008  T:  01/06/2008  Job:  [id number]

## 2011-03-10 NOTE — Consult Note (Signed)
[name], [name]                ACCOUNT NO.:  [id number]   MEDICAL RECORD NO.:  [id number]          PATIENT TYPE:  INP   LOCATION:  A329                          FACILITY:  [facility]   PHYSICIAN:  Vickki Hearing, M.D.DATE OF BIRTH:  1920/03/15   DATE OF CONSULTATION:  01/27/2009  DATE OF DISCHARGE:                                 CONSULTATION   CONSULT REQUESTED BY:  Margaretmary Dys, MD   CHIEF COMPLAINT:  Pain, left hip.   HISTORY:  The history is incorporated by, reference chart has been  reviewed.  Basically, an 77 year old female who fell, injured her left  hip.  X-rays show a left superior pubic ramus fracture.  She has a right  a gamma nail in from a previous right hip fracture.  She has a history  of hypertension, COPD, spondylosis with compression fractures at C3, C4,  C5, C6, and C7 status post cervical fusion.  She has history of atrial  fibrillation, iron-deficiency anemia, peripheral neuropathy,  hypothyroidism, dyslipidemia.   She is on amiodarone, aspirin, calcium carbonate, clonidine,  cyclosporin, Lovenox, Plendil, and Synthroid.   ALLERGIES:  She reports allergies to CODEINE and SULFA.   SOCIAL HISTORY:  She is single and has been living independently for the  most part.   REVIEW OF SYSTEMS:  Negative except for some shoulder pain.   FAMILY HISTORY:  Noncontributory.   PHYSICAL EXAMINATION:  VITAL SIGNS:  Stable.  Temperature 98.1, pulse  66, respiratory rate 18, blood pressure 130-150 over 40-60, room air sat  98%.  APPEARANCE:  She is a well-developed, well-nourished female, grooming  and hygiene intact.  She has a thin, small frame and small body habitus.  CARDIOVASCULAR:  Good peripheral pulses.  No peripheral edema.  SKIN:  Multiple bruises, which she says are chronic.  She has a large  bruise over the left elbow which is not chronic and that is acute but no  pain and she has normal range of motion there.   She has no lymphadenopathy in the  cervical spine.   She has normal sensation in her upper and lower extremities.   She has no deformity in the lower extremities.  Leg lengths are very  close to equal.  No major contractures, subluxation, atrophy, or tremor  in the 4 extremities.   Shoulders are nontender.   IMPRESSION:  X-rays show a superior pubic ramus fracture, stable.  Lumbar spine films were also completed which show posterior lumbar  interbody fusion at L1-L3, compression fracture at L2, anterolisthesis  L5-S1, grade 1 degenerative changes throughout.   Left and right hip films show no hip fracture on the left and a  previously placed gamma nail on the right.  There was also a left  superior pubic ramus fracture which is acute.   PLAN:  Bed rest, pain medication, DVT prevention, mobilization as  tolerated, weightbearing as tolerated with a walker, and then probable  need for placement for therapeutic exercises, and gait training.      Vickki Hearing, M.D.  Electronically Signed     SEH/MEDQ  D:  01/27/2009  T:  01/27/2009  Job:  [id number]

## 2011-03-10 NOTE — H&P (Signed)
[name], [name]                ACCOUNT NO.:  [id number]   MEDICAL RECORD NO.:  [id number]          PATIENT TYPE:  INP   LOCATION:  A339                          FACILITY:  [facility]   PHYSICIAN:  Vickki Hearing, M.D.DATE OF BIRTH:  [birth year]   DATE OF ADMISSION:  12/27/2007  DATE OF DISCHARGE:  LH                              HISTORY & PHYSICAL   CHIEF COMPLAINT:  Right hip pain.   HISTORY:  She is an 77 year old female who is status post an L1 to L3  posterior decompression and fusion by Dr. Jeral Fruit in April 2008 secondary  to loss of ability to walk, tolerated that procedure well, did well  until December 27, 2007 when she fell at [facility], fractured her right hip.  She has a three-part intertrochanteric pertrochanteric type fracture.  She has significant history of the following:  She has hypertension.  She has a history of atrial fibrillation which has resolved  spontaneously.  She has history of arthritis.  She has a history of  anxiety, history of gastric ulcer disease, irritable bowel syndrome,  anemia, hypothyroidism, coronary artery disease, congestive heart  failure.  Her medical conditions have been quiescent in the last 2-3  years.  She had an echocardiogram January of 2008.  She had a cardiac  catheterization in September of 2005 which did show proximal LAD  stenosis 40%, circumflex vessel 25% stenosis as well, also right  coronary 25% to 30% stenosis.  Ejection fraction was 65%.   She denies any recent illnesses but does note that her upper extremities  have become weak.  She has lost ability to do fine motor tasks.  She has  numbness and tingling in her upper extremities.  She was scheduled for  the cervical decompression secondary to upper extremity weakness and  spinal stenosis in the cervical spine.   SHE IS ALLERGIC TO SULFUR AND CODEINE.  SHE IS SENSITIVE TO MORPHINE IN  TERMS OF DOSE AND MENTAL STATUS.   FAMILY HISTORY:  Is unremarkable.   REVIEW OF SYSTEMS:   Otherwise negative.   PAST MEDICAL HISTORY:  As stated.   VITAL SIGNS:  Temperature is 98.7, pulse 65, respiratory rate 23, blood  pressure is 150/72.  APPEARANCE:  This is a frail, elderly, thin lady with no gross  deformities.  Her injured right leg is in traction.  CERVICAL SPINE:  She has limited flexion of the cervical spine.  Rotation is also limited in each direction.  There is no tenderness in  the cervical spine.  Her lymph nodes there are normal.  SKIN:  She has significant subcutaneous ecchymotic areas of both legs  distally and then in various places on both upper extremities.  ABDOMEN:  Was soft and nondistended with no tenderness.  Her upper extremities had normal passive range of motion, weak grip,  normal pulse and perfusion.  LEFT LOWER EXTREMITY:  She has neutral dorsiflexion of the left foot  with knee flexion.  This becomes a 5 degree plantar flexion contracture  with knee extension, although strength and muscle tone are normal.  Reflexes were somewhat  hyperreflexic in her upper extremities  bilaterally.  Left lower extremity was normal at the knee.  Ankle was  not tested.  Right hip examination showed pain with a logroll maneuver, tenderness  over the right greater trochanter.  Muscle tone was normal.   RADIOGRAPHS:  Show a three-part intertrochanteric pertrochanteric  fracture of the right hip.   PLAN:  Is for insertion of gamma nail in the right hip.  Risks and  benefits have been explained to the patient in the presence of her  granddaughter.  The patient understood and asked questions and questions  were answered.  Specific to this procedure, infection, nonunion, implant  cut out DVT, pulmonary embolus were discussed.  I also discussed the  patient may not be able to go back home, will need rehabilitation.  I  also discussed surgery may not be able to be done secondary to the spine  fusion which may compromise spinal anesthetic, and with her cervical   spinal stenosis, we may not be able to intubate the patient here.  This  has been discussed with her and her granddaughter.  We will proceed with  surgery and attempt to do a spinal anesthetic, and if that is  unsuccessful, then we will try intubation, but if we are having  difficulty, we will abort.  I have discussed this with the  anesthesiologist, Dr. Jayme Cloud.      Vickki Hearing, M.D.  Electronically Signed     SEH/MEDQ  D:  12/28/2007  T:  12/28/2007  Job:  [id number]

## 2011-03-13 NOTE — Procedures (Signed)
[name], MUCKLE                ACCOUNT NO.:  [id number]   MEDICAL RECORD NO.:  [id number]          PATIENT TYPE:  OUT   LOCATION:  RAD                           FACILITY:  [facility]   PHYSICIAN:  Dani Gobble, MD       DATE OF BIRTH:  01/14/1920   DATE OF PROCEDURE:  07/07/2005  DATE OF DISCHARGE:                                  ECHOCARDIOGRAM   REFERRING PHYSICIAN:  Jorja Loa, M.D./Ann Domingo Sep, M.D.   INDICATIONS:  This Nater is an 77 year old female with past medical history  of hypertension and atrial fibrillation.   The technical quality of the study is adequate.   The aorta is within normal limits measured at 2.5 cm.   The left atrium also measures within normal limits at 3.0 cm.  No obvious  clots or masses were appreciated and the patient appeared to be in sinus  rhythm during this procedure.   The interventricular septum and posterior wall are mildly thickened.   The aortic valve is probably trileaflet.  It is mildly thickened, but  without limitation of leaflet excursion.  No aortic insufficiency is noted.  Doppler interrogation of the aortic valve is within normal limits.   The mitral valve also appears minimally thickened, but without limitation of  the leaflet excursion.  There is flat coaptation of the mitral valve  leaflets, but no mitral valve prolapse is appreciated.  Mild mitral annular  calcification is noted.  Mild mitral regurgitation is noted.  Doppler interrogation of mitral valve is within normal limits.   The pulmonic valve is not well-visualized, but appeared be grossly  structurally normal.   Tricuspid valve also appears grossly structurally normal with mild to  moderate tricuspid regurgitation.  Estimated RVSP is between 40-50 mmHg.   The left ventricle is small in size with the LVIDD measured at 3.5 cm and  the LVISD measured at 2.4 cm.  Overall, left ventricular systolic function  is normal and no regional wall motion abnormalities are  noted.   The right atrium and right ventricle appear normal in size and right  ventricular systolic function appears normal.   IMPRESSION:  1.  Mild concentric left ventricular hypertrophy.  2.  Mild mitral regurgitation.  3.  Mild to moderate tricuspid regurgitation.  4.  Mild to moderate pulmonary hypertension.  5.  Small caliber left ventricular cavity with normal left ventricular      systolic function and no regional wall motion abnormalities noted.           ______________________________  Dani Gobble, MD     AB/MEDQ  D:  07/07/2005  T:  07/07/2005  Job:  [id number]   cc:   Jorja Loa, M.D.  [address]  [place]  [place] [address]  Fax: [phone]

## 2011-03-13 NOTE — Consult Note (Signed)
[name], [name]                ACCOUNT NO.:  [id number]   MEDICAL RECORD NO.:  [id number]          PATIENT TYPE:  INP   LOCATION:  3105                         FACILITY:  [facility]   PHYSICIAN:  Coralyn Helling, MD        DATE OF BIRTH:  06/29/1920   DATE OF CONSULTATION:  02/10/2007  DATE OF DISCHARGE:                                 CONSULTATION   REFERRING PHYSICIAN:  Dr. Hilda Lias.   REASON FOR CONSULTATION:  Pneumonia.   HISTORY OF PRESENT ILLNESS:  [name] is an 77 year old female who was  admitted on April 16 for further evaluation of neurogenic claudication.  she has a spinal fracture with L2 stenosis and had undergone bilateral  L2 laminectomy with pedicle screw insertions in L1, L2 and L3 on April  16.  Per review of her records, it appears that her operative course was  recently uneventful and she was recovering well through the night.  However, she was noted to have a fever up to 103 earlier in the day.  A  chest x-ray was obtained and showed a significant infiltrate involving  the right lung, in addition to atelectasis, which is a new finding  compared to an x-ray done on April 11.  [name] says that she did have  difficulty feeling like she was choking when she was drinking something  earlier in the day.  She has also been hospitalized twice within the  last 3 months for episodes of pneumonia and per review of hospital  discharge from [facility] in March, there was a concern that she had may  have had aspiration pneumonia at that time.  She denies having any  current symptoms of nasal congestion or postnasal drip.  She is not  having any chest pain.  She is having a dry cough.  She does complain of  pain at her surgical site, but is not having abdominal pain or nausea.  She has not having any diarrhea.  She does not have any leg swelling.   PAST MEDICAL HISTORY:  1. Paroxysmal atrial fibrillation.  2. Hypertension.  3. Hyperlipidemia.  4. GI bleeding with a  history of gastric ulcers as well as possibly      angiodysplasia.  5. Venous insufficiency.  6. Irritable bowel syndrome.  7. Congenital strabismus.  8. Hypothyroidism.  9. Congestive heart failure with diastolic dysfunction.  10.Hiatal hernia.   PAST SURGICAL HISTORY:  Significant for a total abdominal hysterectomy.   ALLERGIES:  She has allergies to SULFA and CODEINE.   FAMILY HISTORY:  Noncontributory.   SOCIAL HISTORY:  She is widowed.  She lives in a nursing home.  There is  no history of tobacco use.   CURRENT MEDICATIONS:  Aspirin, Catapres, Colace, Cordarone, ferrous  sulfate, subcutaneous heparin, hydrochlorothiazide, Lasix, Mevacor,  morphine, Neurontin, Os-Cal, Plendil, Protonix, Synthroid, Toprol,  Ventolin and vitamin C.   PHYSICAL EXAM:  GENERAL:  She is seen in the intensive care unit.  She  is awake and alert and does not appear to be in any acute distress.  She  is  warm to the touch.  HEENT:  There is no sinus tenderness, no oral lesions.  LYMPHATIC:  No lymphadenopathy.  HEART:  S1 and S2.  CHEST:  She has crackles heard at the right base, no wheezing.  ABDOMEN:  Thin soft, nontender.  Positive bowel sounds.  EXTREMITIES.  There was no edema, cyanosis or clubbing.   RADIOLOGIC FINDINGS:  Chest x-ray shows an infiltrate involving the  right lung.   LABORATORY TESTS:  Pending.   IMPRESSION:  1. Pneumonia with possible aspiration.  I will start her on vancomycin      and Zosyn, since she has recently been on antibiotics, as well as      had several hospital admissions recently, in addition to being a      nursing home resident.  I would also keep her nothing-by-mouth and      have her undergo a swallowing evaluation to further assess the      possibility of aspiration.  I would follow up on her chest x-ray      and her laboratory results.  2. History of paroxysmal atrial fibrillation.  3. History of hypertension.  4. History of hyperlipidemia.  5.  History of hypothyroidism.  6. History of congestive heart failure with diastolic dysfunction.  7. History of hiatal hernia.  8. Gastrointestinal bleeding with gastric ulcers and possible      angiodysplasia.  9. Venous insufficiency.  10.Irritable bowel syndrome.  11.Congenital strabismus.  12.Neurogenic claudication with L2 stenosis, status post lumbar      laminectomy.      Coralyn Helling, MD  Electronically Signed     VS/MEDQ  D:  02/10/2007  T:  02/11/2007  Job:  [id number]

## 2011-03-13 NOTE — Op Note (Signed)
NAMENUPUR, HOHMAN                ACCOUNT NO.:  [id number]   MEDICAL RECORD NO.:  [id number]          PATIENT TYPE:  INP   LOCATION:  3172                         FACILITY:  [facility]   PHYSICIAN:  Hilda Lias, M.D.   DATE OF BIRTH:  10/14/1920   DATE OF PROCEDURE:  02/09/2007  DATE OF DISCHARGE:                               OPERATIVE REPORT   PREOPERATIVE DIAGNOSES:  1. Fracture of L2 with stenosis.  2. Neurogenic claudication.  3. Osteoporosis.   POSTOPERATIVE DIAGNOSES:  1. Fracture of L2 with stenosis.  2. Neurogenic claudication.  3. Osteoporosis.   PROCEDURE PERFORMED:  1. Bilateral L2 laminectomy.  2. Decompression of the thecal sac.  3. Pedicle screws, L1, L2 and L3.  4. Posterolateral arthrodesis with bone morphogenic proteins, Vitoss      and Cell Saver.  5. C-arm.   SURGEON:  Hilda Lias, M.D.   ASSISTANT:  Stefani Dama, M.D.   INDICATIONS FOR PROCEDURE:  Ms. Graig is an 77 year old lady who fell  and, since then, she had been complaining at the beginning of back pain,  but now she is having pain down both legs every time she walks.  She  previously had laminectomies in the lumbar area.  X-rays showed a  fracture of L2 with severe stenosis at that level.  Because of that, we  had two choices.  One was to do a resection of the vertebral body and  fusion through a retroperitoneal approach and the other one would be  laminectomy with pedicle screws from L1 to L3 with a posterior  arthrodesis.  At the end, we decided, because of her age and her  osteoporosis, to proceed with the posterior approach.  The risks were  explained in the history and physical.   DESCRIPTION OF PROCEDURE:  Ms. Valdes was taken to the OR and she was  positioned in a prone manner.  The back was cleaned with DuraPrep.  A  midline incision was made from the last spinous process all the way to  above and below.  At the end, we had visualization of the T12, L1, L2  and L3 facet joints.   Then, with the 120 mm Kerrison punch as well as  the drill, we did bilateral laminectomy of L2 with decompression of the  thecal sac.  This was done to give more space to the canal.  From then  on, using the C-arm in [facility] and lateral views, the pedicles of L1, L2 and  L3 were identified.  Pedicle probes were inserted and, at the end, we  introduced six screws, two of them into the body of the fracture of L2,  which probably was the stronger bone.  The screws went from 5.5 to 45  and 50.  Then, a rod was used in place and kept in place with the  capsule.  Then, a cross limb from right to left rod was used.  Although  the bone was osteoporotic, nevertheless, we had a good fixation.  Then,  using the Kerrison punch as well as the Le[place], as well as the drill,  we removed the periosteum of the transverse process of L1, L2 and L3 as  well as  the lateral aspect of the facet.  Then, a mix of Vitoss as well as BMP  was used for posterolateral arthrodesis.  From then on, the area was  irrigated. Valsalva maneuver was negative.  The wound was closed with  Vicryl and Steri-Strips.           ______________________________  Hilda Lias, M.D.     EB/MEDQ  D:  02/09/2007  T:  02/09/2007  Job:  [id number]

## 2011-03-13 NOTE — Consult Note (Signed)
NAME:  [name], COURTS                          ACCOUNT NO.:  [id number]   MEDICAL RECORD NO.:  [id number]                   PATIENT TYPE:   LOCATION:                                       FACILITY:   PHYSICIAN:  Lionel December, M.D.                 DATE OF BIRTH:  03-Nov-1919   DATE OF CONSULTATION:  02/14/2003  DATE OF DISCHARGE:                                   CONSULTATION   REFERRING PHYSICIAN:  Jorja Loa, M.D.   REASON FOR CONSULTATION:  Anemia, melena.   HISTORY OF PRESENT ILLNESS:  The patient is an 77 year old Caucasian female  patient of Dr. [name] who presents today for further evaluation of above-  stated symptoms.  She has been seen once by our practice in 1999 for  intermittent diarrhea, felt may be due to IBS versus enteric infection.  She  apparently has done well, and we have not seen her since.  She states a  couple of weeks ago she developed severe epigastric pain, which radiated up  into her chest.  She started having these symptoms primarily after eating  dinner and sometimes at night.  Pain would also be a little bit in the left  upper quadrant as well.  She lost her appetite.  She would have occasional  acid regurgitation with leaning over but no typical heartburn symptoms.  Denies any dysphagia or odynophagia.  She saw Dr. [name], who started her  on Prevacid.  These symptoms have been much better since then.  During this  time, she also had an episode of melena.  She has seen black stools in the  past as well.  She has returned several Hemoccult-negative cards in the last  couple of months due to anemia.  In February, her hemoglobin was only  slightly low at 12.1.  On February 06, 2003, she did have one Hemoccult-  positive card; two other ones were returned, but results are not available  to me at this time.  Her last hemoglobin was 11.3, hematocrit 33.8.  LFTs,  amylase, and lipase were all negative.   CURRENT MEDICATIONS:  1. Chlorthalidone 25  mg one-half tablet daily.  2. Bisoprolol/HCTZ 5 mg one daily.  3. Clonidine HCl 0.2 mg b.i.d.  4. Captopril 50 mg b.i.d.  5. Prevacid 30 mg daily.  6. Vitamin E 400 IU daily.  7. Vitamin C 500 mg daily.  8. Caltrate 600 mg + D b.i.d.  9. Tylenol PM q.h.s.  10.      Tylenol p.r.n.  11.      Vitamin B Complex daily.   ALLERGIES:  SULFA and CODEINE.   PAST MEDICAL HISTORY:  1. Hypertension.  2. History of atrial fibrillation.    PAST SURGICAL HISTORY:  1. Hysterectomy in the '80s.  2. In 1988, she had back surgery for spinal stenosis; she had numbness in     her left  lower extremity and difficulty walking, which has been better     since surgery.  3. She had left eye surgery times two back in 03-22-68 for congenital     strabismus, which did not help her.   FAMILY HISTORY:  Mother lived to be 78.  Father died of CVA at age 53.  Two  sisters and three brothers have passed away from nonmalignant conditions.  Denies any colorectal cancer or chronic GI illnesses in the family.   SOCIAL HISTORY:  She is widowed; her husband died in 03/22/2002.  She has two  sons.  She does not smoke.  She occasionally drinks alcohol.   REVIEW OF SYSTEMS:  GI:  Please see HPI for GI.  GENERAL:  Denies any weight  loss.  CARDIOPULMONARY:  Denies any shortness of breath.   PHYSICAL EXAMINATION:  VITAL SIGNS:  Weight 105, blood pressure 110/68,  pulse 70.  GENERAL:  A pleasant, thin Caucasian female in no acute distress.  SKIN:  Warm and dry.  No jaundice.  HEENT:  Conjunctivae are pink.  Sclerae are nonicteric.  She has a temporal  deviation of her left eye.  Oropharyngeal mucosa moist and pink; no lesions,  erythema, or exudate.  NECK:  No lymphadenopathy or thyromegaly.  CHEST:  Lungs are clear to auscultation.  CARDIAC EXAM:  Reveals regular rate and rhythm.  Normal S1 and S2.  No  murmurs, rubs, or gallops.  ABDOMEN:  Positive bowel sounds.  Soft, nondistended.  She has very mild  epigastric  tenderness to deep palpation.  No organomegaly or masses.  EXTREMITIES:  No edema.   IMPRESSION:  The patient is a pleasant 77 year old female who recently  developed severe epigastric pain associated with an episode of melena.  She  has also had anemia with hemoglobin drop since last checked in February.  She turned in at least one Hemoccult-positive stool that she completed on  02/06/2003; the other two Hemoccult card results are unavailable to me at  this time.  She needs to have an upper endoscopy to further evaluate these  symptoms; she may have had an upper gastrointestinal bleed due to peptic  ulcer disease.  In addition, she has never had a colonoscopy.  Initially, I  would like to assess her upper gastrointestinal tract; however, we did  discuss today that she needs to have a colonoscopy.  Patient is agreeable.   PLAN:  1. She will continue Prevacid 30 mg daily for now, number 25 samples given.  2. EGD in the near future.  3. If she develops any further melena, feels lightheaded or weak, she will     let us know or present to the emergency     department.  4. We will plan on colonoscopy at some point in the near future, after     assessing upper GI tract.   I would like to thank Dr. [name] for allowing [place] to take part in the care  of this patient.     Tana Coast, P.A.                        Lionel December, M.D.    LL/MEDQ  D:  02/14/2003  T:  02/14/2003  Job:  [id number]   cc:   Jorja Loa, M.D.  [address]  [place]  [place] [address]  Fax: [phone]

## 2011-03-13 NOTE — Group Therapy Note (Signed)
[name], [name]                ACCOUNT NO.:  [id number]   MEDICAL RECORD NO.:  [id number]          PATIENT TYPE:  INP   LOCATION:  A219                          FACILITY:  [facility]   PHYSICIAN:  Margaretmary Dys, M.D.DATE OF BIRTH:  1919/11/03   DATE OF PROCEDURE:  11/14/2006  DATE OF DISCHARGE:                                 PROGRESS NOTE   SUBJECTIVE:  The patient feels better. She says her breathing  is  unchanged. She denies any chest pain. Leg and back continues to hurt,  but they are better. X-rays do not reveal any fracture.   OBJECTIVE:  The patient is conscious, alert, comfortable, alert, and  oriented to time, place, and person. Pleasant.  VITAL SIGNS:  Blood pressure 129/49, pulse of 48, respirations of 18,  temperature 97.4, oxygen saturation was 100% on 2 L.  HEENT EXAM:  Normocephalic, atraumatic. Oral mucosa was moist with no  exudates.  NECK:  Supple, no JVD, no lymphadenopathy.  LUNGS:  Crackles at the bases.  HEART:  S1, S2, occasional missed beats.  ABDOMEN:  Was soft.  EXTREMITIES:  No pedal edema. The patient had multiple bruises in the  lower extremities.   LABORATORY/DIAGNOSTIC DATA:  A 2 dimensional echocardiogram done on  November 09, 2006, shows no change from a year and a half ago. She was  noted to have a small right pleural effusion and normal left atrium,  right ventricle. The ejection fraction was not indicated but he did  report that there was normal regional and global function of her left  ventricle.   WBC was 5, hemoglobin 9.3, hematocrit 27.6, platelet count was 299, INR  was normal, sodium 133, potassium 5, chloride of 98, CO2 30, BUN of 26,  creatinine was 1.1, glucose of 96.  Liver function test today show some  slight worsening when compared to yesterday with increases in the AST  and the ALT. The AST was up to 161, ALT of 111, albumin was 2.2.  Compared to December, the PFTs were normal at the time.   The MRI of the hip did not  indicate any evidence of fracture.   ASSESSMENT/PLAN:  1. Congestive heart failure. The patient is much better. She is      currently on diuresis with Lasix 20 mg p.o. once a day. Her      cardiac:  Echocardiogram was really unremarkable. Perhaps she may      have more doubts for leak dysfunction.  We will continue on cardiac      medications at this time. She remains on amiodarone  for regular      heart rhythm.  2. Back and neck pain appears to be better controlled. We will      continue on pain medications.   With her physical deconditioning, the patient would likely need a  transfer to a skilled nursing center prior to discharge for  rehabilitation.   She does have a history of atrial fibrillation but is rate  controlled.She is at significant fall risk. The patient informed me that  she lives alone at home. We  may have to address her ability to continue  to live at home long term by herself.      Margaretmary Dys, M.D.  Electronically Signed     AM/MEDQ  D:  11/14/2006  T:  11/14/2006  Job:  [id number]

## 2011-03-13 NOTE — Group Therapy Note (Signed)
[name], [name]                ACCOUNT NO.:  [id number]   MEDICAL RECORD NO.:  [id number]          PATIENT TYPE:  INP   LOCATION:  A223                          FACILITY:  [facility]   PHYSICIAN:  Vania Rea, M.D. DATE OF BIRTH:  December 26, 1919   DATE OF PROCEDURE:  07/21/2004  DATE OF DISCHARGE:                                   PROGRESS NOTE   SUBJECTIVE:  Chest pain has gone.  The patient is confused as to why she is  in the hospital, although she says the chest pain was the worse she ever  felt in her life.   OBJECTIVE:  VITAL SIGNS:  Temperature 98.0, pulse ranged between 47-50,  respirations 18, blood pressure 157/76, saturating 97% on 2 L.  CHEST:  Clear to auscultation bilaterally.  CARDIOVASCULAR:  She has a regular rhythm.  The chest wall posteriorly, she  has erythematous, scaly rash in the right posterior chest wall and also in  the left lower regions of the chest posterior.  There is no rash on the  anterior chest.  ABDOMEN:  Soft and nontender.  EXTREMITIES:  Left lower extremity is bandaged to the knee.  She has a  history of chronic ulcers.  Right lower extremity has multiple old bruises.  She says she bruises easily.   LABORATORY DATA:  CK total within normal limits.  CK-MB within normal  limits, and the troponin ranging between 0.02-0.05   MEDICATIONS:  1.  Aspirin 325 mg daily.  2.  Ziac 1 tab daily.  3.  Captopril 50 mg t.i.d.  4.  Clonidine 0.2 mg b.i.d.  5.  Lovenox 50 mg subcu daily.  6.  Gabapentin 100 mg t.i.d.  7.  Nitroglycerin p.r.n.   ASSESSMENT:  1.  History of paroxysmal atrial fibrillation.  2.  Chest pain associated with palpitations, resolved after 30 minutes.  3.  Bradycardia associated with Clonidine use.  4.  Chronic pain syndrome.   PLAN:  We will check her CBC and her BMET now, particularly __________  potassium.  We will start her on Protonix for her history of  gastroesophageal reflux disease.  We will give her the benefits of a  cardiology evaluation.  We will also check her TSH.  We will leave  adjustments of her cardiac medications for cardiologist.     Leop   LC/MEDQ  D:  07/21/2004  T:  07/21/2004  Job:  [id number]

## 2011-03-13 NOTE — Group Therapy Note (Signed)
[name], [name]                ACCOUNT NO.:  [id number]   MEDICAL RECORD NO.:  [id number]          PATIENT TYPE:  INP   LOCATION:  A223                          FACILITY:  [facility]   PHYSICIAN:  Vania Rea, M.D. DATE OF BIRTH:  [birth year]   DATE OF PROCEDURE:  DATE OF DISCHARGE:                                   PROGRESS NOTE   SUBJECTIVE:   OBJECTIVE:  INCOMPLETE DICTATION     Leop   LC/MEDQ  D:  07/21/2004  T:  07/21/2004  Job:  [id number]

## 2011-03-13 NOTE — H&P (Signed)
NAME:  [name], [name]                          ACCOUNT NO.:  [id number]   MEDICAL RECORD NO.:  [id number]                   PATIENT TYPE:  INP   LOCATION:  A203                                 FACILITY:  [facility]   PHYSICIAN:  Hanley Hays. Dechurch, M.D.           DATE OF BIRTH:  06-May-1920   DATE OF ADMISSION:  02/16/2004  DATE OF DISCHARGE:                                HISTORY & PHYSICAL   An 77 year old Caucasian female who lives independently followed by  __________and Dr. Dietrich Pates for hypertension with a past medical history of  atrial fibrillation with rapid rate though no recurrence and hypertension  who was in her usual state of good health until the day of admission when  she arose from the breakfast table and had sudden onset of  severe  weakness in her arms and legs.  She states she felt like she could not go  on though she denied any kind of presyncopal symptoms.  She had no chest  pain, no palpitations.  In fact, she took her pulse and noted it was slow,  but that is apparently normal for her.  The weak feeling persisted through  most of the day.  She had no nausea, no vomiting, no fever, no chills and  she felt at the insistence of her family that she should be evaluated in the  emergency room.  The patient was brought to the emergency room where her  heart rate was noted to be 40, though her blood pressure was stable.  She  was also noted to have though a normal CK at 127, her relative index was  slightly elevated.  Her EKG reveals nonspecific ST changes though the  current EKG does not reveal anything consistent with ischemia.  Sodium is  also noted to be 125.  Because of these findings, she is being admitted to  the hospital for further evaluation.   Her review of systems are pertinent for addition of Verapamil to her drug  regimen several weeks ago.  She states she has not felt well since.  She  complained of being sluggish.  She actually had a fall two days prior  where  she tripped in her driveway.  There was no presyncope involved.  She has had  two other falls over the last year and a half or so, but fortunately  sustained no significant injury.   MEDICATIONS:  1. Verapamil, dose unknown.  2. Ziac, dose unknown.  3. Hydrochlorothiazide 12.5 daily.  4. Clonidine 0.2 b.i.d.  5. Capoten 50 mg b.i.d.   She does not take any aspirin or other drugs.  She will occasionally take  Tylenol for pain.   ALLERGIES:  No known drug allergies.   PAST MEDICAL HISTORY:  Hypertension apparently relatively well controlled.  History of atrial fibrillation with rapid ventricular rate in 2001,  converted spontaneously.  Apparently, had a Cardiolite as an outpatient, but  no other  workup ensued.  She has never been on anticoagulation.  Degenerative disk disease, history of gastric ulcer related to NSAIDs and H.  Pylori which apparently was treated in 2004.   REVIEW OF SYSTEMS:  Weight has been stable.  She has no exertional symptoms.  She sleeps well.  She lives alone.  She has been doing quite well overall.  She remains active maintaining her own home.  She sometimes notes difficulty  sleeping.  Urinary frequency at night.  This has not been evaluated.  She  has essentially unremarkable review of systems otherwise.   FAMILY HISTORY:  One of her six siblings are living who has apparently had  bypass as well as valvular heart surgery.  She had another brother who died  of heart related problems.  All of her other siblings and parents, with the  exception of her father, died of old age.  Her father died at age 71 with  complications related to a stroke.   SOCIAL HISTORY:  She is widowed x2 years, living alone.  She has two sons  and one in [place], [place], one in [place], [place] who are  supportive.  She has a good social network.  No alcohol or tobacco abuse.   PHYSICAL EXAMINATION:  GENERAL:  Reveals a thin, frail, elderly female with   abrasions over the bridge of her nose and resolving ecchymoses.  She has  several ecchymoses on her arms as well, but no acute findings. She is alert  and appropriate.  Verbally fluent with mental status and neurologic exam  completely intact.  HEENT:  Oropharynx was moist.  NECK:  Supple.  There is no jugular venous distention, adenopathy.  No  thyromegaly.  HEART:  Regular rate at 68.  There is no murmur noted.  ABDOMEN:  Flat, soft, nontender.  No mass.  LUNGS:  Clear to auscultation.  EXTREMITIES:  She is without clubbing, cyanosis or edema.  VITAL SIGNS:  Blood pressure orthostatic lying is 188/76, sitting 161/76 and  standing 160/87.  Pulse does not change.  Temperature is 97.8.  Respirations  are unlabored.  Weight 106.6.   ASSESSMENT/PLAN:  1. Sudden onset of weakness in a patient who does demonstrate some     orthostasis on multiple medications for her blood pressure who had sinus     bradycardia.  Will hold the Verapamil and monitor.  Consider     consolidating her medications.  Expense is an issue for this patient, of     course.  2. Hyponatremia, probably on the basis of her thiazide diuretic may be     playing a role in her symptomatology.  She feels well now.  She has had     some fluid.  Blood pressure is actually currently 137/92.  Will monitor     and consider an alternative agent.  3. Recent fall with abrasions.  Fortunately, no significant injury and this     was clearly related to an accident and not some presyncopal event.     Further workup is not indicated.  The patient has a history of paroxysmal     atrial fibrillation not demonstrated currently with negative workup, not     on any coagulation which is probably reasonable though baby aspirin might     be an option in this patient, but she does not apparently have coronary     artery disease as this was stopped by her primary care physician and it    was felt by  her cardiologist  that she was low risk in any  event.     ___________________________________________                                         Hanley Hays. Josefine Class, M.D.   FED/MEDQ  D:  02/17/2004  T:  02/17/2004  Job:  [id number]

## 2011-03-13 NOTE — H&P (Signed)
NAMEVIVIA, [name]                ACCOUNT NO.:  [id number]   MEDICAL RECORD NO.:  [id number]           PATIENT TYPE:  AMB   LOCATION:                                FACILITY:  [facility]   PHYSICIAN:  Lionel December, M.D.    DATE OF BIRTH:  1920/07/31   DATE OF ADMISSION:  DATE OF DISCHARGE:  LH                                HISTORY & PHYSICAL   PRIMARY CARE PHYSICIAN:  Dr. [name].   CHIEF COMPLAINT:  EGD for dysphagia and change in bowel habits.   HISTORY OF PRESENT ILLNESS:  [name] is an 77 year old Caucasian female  patient of Dr. Susa Griffins.  She is here today for evaluation of two  problems.  Number one she notes a change in bowel habits with being  constipated and having hard stools for the last month.  She has noted  significant straining with her stools.  She is having scant amounts of light  brown stool everyday or every other day.  She denies any associated  abdominal pain.  She has been using some Citrucel with minimal relief.  She  denies any history of constipation.  She denies any rectal bleeding or  melena.  She does complain of increased flatulence and abdominal bloating.  She denies any history of fecal incontinence.  Her weight has remained  stable and her appetite is good.  She did have a colonoscopy by Dr. Karilyn Cota  on May 18, 2003.  She had two long linear ulcers at the mid sigmoid colon,  small external hemorrhoids, and otherwise a normal exam.   She also complains of dysphagia both to solids and liquids for at least  three or four months as she can recall.  She also notes regurgitation a few  minutes after eating, especially in the evenings after dinner.  This happens  with both liquids and solids.  She denies any nausea and vomiting, denies  any heartburn, or indigestion.  She does have a history of a gastric ulcer.   PAST MEDICAL HISTORY:  1.  Hypertension.  2.  Atrial fibrillation on amiodarone.  3.  Gastric ulcer.  4.  H. pylori positive status post  triple drug therapy in July 2004.  5.  Irritable bowel syndrome.  6.  Elevated BUN and creatinine with a negative renal ultrasound per the      patient's report.  7.  Normal recent brain MRI for staggering.  8.  Left hip arthritis.  9.  Hysterectomy and unilateral oophorectomy in 1980.  10. In 1988 she had back surgery for spinal stenosis.  11. She has had left eye surgery for congenital strabismus.   CURRENT MEDICATIONS:  1.  Clonidine 0.2 mg b.i.d.  2.  Vitamin C 500 mg daily.  3.  Caltrate 600 mg plus vitamin D twice a day.  4.  Gabapentin 100 mg t.i.d.  5.  Nifediac 30 mg daily.  6.  Amiodarone HCl 200 mg 1/2 tablet a day.  7.  HCTZ 25 mg daily.  8.  Aspirin 81 mg daily.  9.  Vitamin D 400 units daily.  ALLERGIES:  1.  CODEINE.  2.  SULFA.   FAMILY HISTORY:  [name] denies any family history of colorectal  carcinoma, liver, or chronic GI problems.  Mother deceased at age 9.  Father deceased at age 85 secondary to a CVA.  She has three sisters and  three brothers who are deceased and one living brother who is relatively  healthy.   SOCIAL HISTORY:  [name] is currently a widow.  She lives alone.  She has  two grown healthy sons.  She is a Futures trader.  She denies any tobacco or drug  use.  She does report occasional alcoholic beverage about once a month.   REVIEW OF SYSTEMS:  CONSTITUTIONAL:  Weight is stable.  She denies any  anorexia or early satiety.  She is complaining of some fatigue.  CARDIOVASCULAR:  Denies any chest pain or palpitations.  PULMONARY:  Denies  any shortness of breath, dyspnea, cough, or hemoptysis.  GASTROINTESTINAL:  See HPI.  NEUROLOGIC:  She is complaining of a staggering gait and  unsteadiness on her feet.  She is being seen by Dr. [name] for this.  MUSCULOSKELETAL:  She does complain of chronic left hip pain and some  arthritis.   PHYSICAL EXAMINATION:  VITAL SIGNS:  Weight 106.5 pounds, height 60 inches.  Temperature 97.6, blood  pressure 120/58, pulse 60.  GENERAL:  [name] is an 77 year old thin Caucasian female who is alert,  oriented, pleasant, and cooperative in no acute distress.  HEENT:  Sclerae clear, nonicteric.  Conjunctivae pink.  She has left eye  deviation laterally.  Oropharynx is pink and moist without any lesions.  She  does upper dentures intact and a lower partial.  Otherwise clear.  NECK:  Supple without any masses or thyromegaly.  HEART:  Regular rate and rhythm with a normal S1 and S2 without any murmurs,  clicks, rubs, or gallops.  LUNGS:  Clear to auscultation bilaterally.  ABDOMEN:  Slightly distended with positive bowel sounds x4.  No bruits  auscultated.  Soft, nontender, and nondistended without palpable masses or  hepatosplenomegaly.  No rebound tenderness or guarding.  RECTAL:  No external lesions visualized.  Good sphincter tone.  There was a  moderate amount of firm stool in the rectal vault.  It was light brown and  hemoccult-negative.  There was no evidence of distal impaction.  No internal  masses were palpated.  EXTREMITIES:  She has a right lower posterior tibial ulcer with erythema and  grayish discoloration.  She is seeing Dr. [name] for today.  SKIN:  With multiple ecchymoses as described in the extremities.   ASSESSMENT:  [name] is an 77 year old Caucasian female with a several  month history of swallowing liquids and foods dysphagia as well as  regurgitation.  She has a history of a gastric ulcer.  I feel her upper  gastrointestinal tract needs to be evaluated to rule out esophageal web ring  or stricture, given her symptoms.   As far as her change in bowel habits and constipation/obstipation, I would  like her to increase her daily fiber and give a trial of MiraLax to see how  she does.  A recent colonoscopy in 2004 was reassuring.   RECOMMENDATIONS:  1.  We will schedule an EGD with a possible __________ in the near future.     I discussed the procedure  including the risks and benefits, including      but not limited to bleeding, infection, perforation, or drug reaction.  She agrees with the plan and a consent was obtained.  She is to hold her      aspirin three days prior to the procedure.  2.  I have given her samples of FiberChoice to take two tablets daily.  3.  Rx was given for MiraLax 17 g daily p.r.n. constipation.  __________ x1      month with one refill.  4.  Further recommendations pending EGD.   We would like to thank Dr. [name] for allowing [place] to participate in the  care of [name].       KC/MEDQ  D:  04/07/2005  T:  04/07/2005  Job:  [id number]

## 2011-03-13 NOTE — H&P (Signed)
[name], [name]                ACCOUNT NO.:  [id number]   MEDICAL RECORD NO.:  [id number]          PATIENT TYPE:  INP   LOCATION:  A312                          FACILITY:  [facility]   PHYSICIAN:  Skeet Latch, DO    DATE OF BIRTH:  12-01-1919   DATE OF ADMISSION:  11/25/2006  DATE OF DISCHARGE:  LH                              HISTORY & PHYSICAL   PRIMARY CARE PHYSICIAN:  Dr. Erby Pian   CHIEF COMPLAINT:  Nausea, vomiting, and diarrhea.   HISTORY OF PRESENT ILLNESS:  [name] is an 77 year old Caucasian  female who presents with 1-day history of nausea, vomiting, and  diarrhea.  Patient states that this evening she began to have nausea and  vomiting x3.  Patient also states that she has been coughing with  productive sputum for the last approximately 3 days.  Patient was  recently discharged from [facility] on November 15, 2006, with  intractable back and leg pain and CHF.  The patient states after  discharge she felt fine for around 3 days, then she began to have cough  and today she experienced the nausea, vomiting, and diarrhea.  Also, the  patient needed to be sent to the emergency room for evaluation.  Patient  states she was started on antibiotic by her primary care physician on  Tuesday, but was not feeling any bit better.  Patient denies any chest  pain, headache, abdominal pain, or any urinary complaints at this time.  On exam, patient is comfortable and is no acute distress.   PAST MEDICAL HISTORY INCLUDES:  1. CHF.  2. Hypertension.  3. Atrial fibrillation.  4. Gastric ulcer.  5. Irritable bowel syndrome.  6. Arthritis.  7. Anemia.  8. Chronic back pain.  9. Hypothyroidism.  10.Coronary artery disease.   PAST SURGICAL HISTORY INCLUDES:  1. Back surgery.  2. Hysterectomy.   CURRENT MEDICATIONS INCLUDE:  1. Toprol 25 mg daily.  2. Enalapril 2.5 mg daily.  3. Amiodarone 100 mg daily.  4. Catapres 0.1 mg b.i.d.  5. Plendil 2 mg daily.  6. Lasix 20  mg daily.  7. Neurontin 300 mg daily.  8. Synthroid 75 mcg daily.  9. K-Dur 20 mg daily.  10.Ultram 1-2 tabs q.4-6 as needed.  11.Aspirin 81 mg daily.  12.Vitamin C 500 mg daily.  13.Artificial Tears 1 drop daily.  14.Lipitor 10 mg daily.  15.Vitamin B 1 tab daily.  16.Lutein 6 mg daily.  17.GlycoLax 1 pill as needed.  18.Hemocyte Plus 1 tab daily.   ALLERGIES:  CODEINE AND SULFA.   FAMILY HISTORY:  Noncontributory.   SOCIAL HISTORY:  She denies any tobacco, alcohol, illicit drug use.   PHYSICAL EXAMINATION:  VITAL SIGNS:  Temperature was 101.2, pulse of 68,  respirations 20.  GENERAL:  The patient is awake and alert, cooperative, appears stated  age, in no acute distress.  HEENT:  Sclerae nonicteric.  Head is atraumatic, normocephalic.  Patient  does have left eye deviation laterally.  Oropharynx was pink and moist.  NECK:  Supple.  No masses.  No JVD.  No thyromegaly.  HEART:  Regular rate and rhythm.  No murmurs, rubs, gallops.  LUNGS:  Moderate wheezing bilaterally, anteriorly and posteriorly,  noted.  Small rales appreciated at bases.  ABDOMEN:  Nondistended, nontender, positive bowel sounds.  EXTREMITIES:  Multiple ecchymoses in lower extremities which seem to be  chronic.   LABORATORY:  White blood cell count 16.4, hemoglobin 8.0, hematocrit  23.8, platelets 441.  Sodium 130, potassium 4.9, chloride 97, CO2 25,  glucose 120, BUN 30, creatinine 1.39.  BNP is 341.   Chest x-ray showed a lower left lobe infiltrate.   ASSESSMENT:  1. Lower left pneumonia.  2. History of congestive heart failure.  3. History of atrial fibrillation.  4. History of hypothyroidism.  5. History of hyperlipidemia.  6. History of intractable back pain with radiation to lower      extremities.   PLAN:  Patient will be admitted for diagnosis of pneumonia.  Patient  will be placed on intravenous Levaquin daily.  We will get a sputum for  Gram stain, and culture and sensitivity.  Blood  cultures x2.  Patient  will be placed on Solu-Medrol 60 mg intravenously q.12 h., as well as  nebulizer treatments q.4 h. and q.2 h. p.r.n.  Patient's hemoglobin is  fairly stable, still low.  Feel at this time patient will need a  gastrointestinal consult regarding her anemia.  We will type and cross 2  units of packed red blood cells.  If hemoglobin drops below 8.0, we will  transfuse 2 units of packed red blood cells.  Also, for her diarrhea, we  will culture for Clostridium difficile.  For her atrial fibrillation,  hypothyroidism, hyperlipidemia, we will place the patient on her home  medications for now.  Also, get a PT evaluation and treatment for her  deconditioning.  We will hold on any blood thinners at this time due to  patient's anemia.      Skeet Latch, DO  Electronically Signed     SM/MEDQ  D:  11/25/2006  T:  11/25/2006  Job:  [id number]   cc:   Dr. Erby Pian

## 2011-03-13 NOTE — Discharge Summary (Signed)
[place]  Patient:    [name], [name]                       MRN: [id number] Adm. Date:  [id number] Disc. Date: 01/19/00 Attending:  Nelta Numbers Dictator:   Abelino Derrick, P.A.C. [facility]                           Discharge Summary  DISCHARGE DIAGNOSES: 1. Paroxysmal atrial fibrillation, converted spontaneously. 2. Chest pain with electrocardiogram changes in atrial fibrillation,    myocardial infarction ruled out. 3. History of hypertension. 4. History of hyperlipidemia.  HOSPITAL COURSE:  The patient is a 77 year old female seen by [place] in the past when she had similar presentation. She apparently had atrial fibrillation flour years ago that converted spontaneously.  An exercise Cardiolite and echocardiogram were within normal limits according to her.  She is never on Coumadin.  She presented to [facility] ER this morning with sudden onset of rapid heart rate, substernal chest pain and bilateral arm weakness. In the emergency room, she converted spontaneously to sinus rhythm. Her EKG in rapid atrial fibrillation revealed significant ST changes.  These resolved when she was in sinus rhythm.  CK MB and troponins were negative.  She is seen on January 19, 2000 and Dr. Dietrich Pates feels she can be discharged. She will have a Cardiolite scan done in the office today.  Verapamil was added.  DISCHARGE MEDICATIONS:  Ziac 5/6.25 q.d.  Coated aspirin q.d.  Ogen 0.625 mg q.d.  Verapamil SR 240 q.d.  LABORATORY DATA:  CPK MB and troponins are negative here.  EKG shows normal sinus rhythm without acute changes.  White count is 6.7. Hemoglobin 13.7. Hematocrit 40.4. Platelet count 172.  D-dimer is less than 0.25. INR is 1.05, BUN 13, creatinine 1.0, sodium 134, potassium 4.2. Liver functions are normal. TSH 3.69.  Chest x-ray on admission revealed mild CHF.  She was given one dose of Lasix.  DISPOSITION:  The patient is discharged in stable condition  and will follow up today in the office for a Cardiolite and then seen the PA in [place] in one month. DD:  01/19/00 TD:  01/19/00 Job: [id number]

## 2011-03-13 NOTE — H&P (Signed)
[name], [name]                ACCOUNT NO.:  [id number]   MEDICAL RECORD NO.:  [id number]          PATIENT TYPE:  INP   LOCATION:  A226                          FACILITY:  [facility]   PHYSICIAN:  Marcello Moores, MD   DATE OF BIRTH:  Mar 21, 1920   DATE OF ADMISSION:  01/16/2007  DATE OF DISCHARGE:  LH                              HISTORY & PHYSICAL   CHIEF COMPLAINT:  Shortness of breath and cough for 2 days.   HISTORY OF PRESENT ILLNESS:  [name] is a 77 year old female patient  and nursing home resident with multiple medical problems, who was  discharged on February6 after she was admitted to [facility]  for pneumonia and discharged improved.  Today she also presented with  cough, shortness of breath and fever since  yesterday and she was  referred from the nursing home.  She denied any chest pain and the cough  is a dry cough without any sputum production and the shortness of breath  is very mild and this started yesterday and persisted until today.  The  patient stated that also she had watery diarrhea since yesterday,  nonbloody, but she has no abdominal pain or vomiting.  The patient was  discharged on February6, 2008 with Levaquin antibiotic and she  completed the course.  Otherwise she denied any urinary complaints.  She  denied any headache and she has not any palpitation.   REVIEW OF SYSTEMS:  10-point review of system is dictated in the HPI,  otherwise it is noncontributory.   ALLERGIES:  She is allergic to CODEINE AND SULFA DRUGS.   SOCIAL HISTORY:  The patient is elderly and living in [facility] nursing home  and she denies alcohol or tobacco abuse.   FAMILY HISTORY:  Noncontributory.   PAST MEDICAL HISTORY:  1. Congestive heart failure.  2. Hypertension.  3. Atrial fibrillation.  4. Gastric ulcer.  5. Irritable bowel syndrome.  6. Arthritis.  7. Anemia.  8. Hypothyroidism.  9. Coronary artery disease.  10.Recent pneumonia.   HOME MEDICATIONS:  1.  Clonidine 0.1 mg b.i.d.  2. Amiodarone 100 mg p.o. daily.  3. __________ 10 mg p.o. daily.  4. Gabapentin 300 mg once a day.  5. Synthroid 75 mcg once a day.  6. Lovastatin 40 mg once a day.  7. Hydrochlorothiazide 25 mg once a day.  8. Enteric-coated aspirin 81 mg once a day.  9. Lasix 40 mg p.o. once a day.  10.Vitamin C.  11.Ultram 50 mg every 6 hours p.r.n.   PHYSICAL EXAMINATION:  Elderly patient lying in the ER bed without any  respiratory distress.  Vital Signs: Blood pressure is 133/33 and pulse  rate is 71, temperature is 101.7, respiratory rate is 20 and saturation  is 95% on room air.  HEENT: She has pink conjunctivae.  Nonicteric sclera, pupils are equal  and reactive.  NECK:  Supple.  No lymphadenopathy  CHEST:  Good air entry bilaterally with creps on the posterior left  valvar area  CARDIAC:  S1-S2 well-heard, irregular, no murmur.  ABDOMEN:  Soft, no tenderness, normoactive bowel  sounds.  EXTREMITIES: Multiple hyperemic telangiectasia on both extremities and  dressed wound on the right lower leg.  CNS: She is alert and neurologically oriented, no neurological deficit.   LABS:  CBC: white blood cell is eight and, hemoglobin is 10, hematocrit  31, platelet count is 321, MCV is 90, RDW is 18.8.  On the chemistry,  sodium is 134, potassium is 4, chloride is 97, bicarb 26, glucose is one  116, BUN 10, creatinine is 1.1, calcium level is 8.5  CHEST X-RAY:  Was done and showed preliminary report on the infiltrate  on the left lower lung area.   ASSESSMENT:  1. Pneumonia with cough, shortness of breath and fever. I will put her      on Zithromax and Rocephin IV and I will add also clindamycin      considering aspiration pneumonia as well, even though the patient      is well oriented and looks as if she has no difficulty in      swallowing, but she is an elderly nursing home resident.  I would      prefer to add clindamycin as well.  Will monitor her vitals,       temperature and breathing and I will give her respiratory      treatments every 6 hours and put her on 2 liters of oxygen and I      will send blood culture as well and will monitor the labs.  2. The patient came with watery diarrhea since yesterday and the      patient was discharged before 1 month with antibiotic and I will      put her on Flagyl for now and I will send stool exam for C. Dif      antigen and if it is negative for I will discontinue with Flagyl.      This diarrhea could also be part of the irritable bowel syndrome      and if not subsiding, probably we might need to consult GI also for      further evaluation.  3. Congestive heart failure, atrial fibrillation, hypothyroidism,      hypertension, chronic anemia are stable and we will continue with      her home medications as before.      Marcello Moores, MD  Electronically Signed     MT/MEDQ  D:  01/16/2007  T:  01/16/2007  Job:  [id number]

## 2011-03-13 NOTE — Procedures (Signed)
NAME:  [name], [name]                          ACCOUNT NO.:  [id number]   MEDICAL RECORD NO.:  [id number]                   PATIENT TYPE:  INP   LOCATION:  A203                                 FACILITY:  [facility]   PHYSICIAN:  Edward L. Juanetta Gosling, M.D.             DATE OF BIRTH:  [birth year]   DATE OF PROCEDURE:  DATE OF DISCHARGE:  02/18/2004                                EKG INTERPRETATION   TIME:  April 4, 23, 2005 at 1332.   FINDINGS:  1. The rhythm is sinus bradycardia with a rate in the 40's.  2. There is T wave abnormality which is nonspecific.  3. Somewhat slow R wave progression across the precordium may indicate a     previous anterior myocardial infarction.  4. Clinical correlation is suggested.      ___________________________________________                                            Oneal Deputy. Juanetta Gosling, M.D.   ELH/MEDQ  D:  02/20/2004  T:  02/21/2004  Job:  [id number]

## 2011-03-13 NOTE — Discharge Summary (Signed)
NAME:  [name], [name]                          ACCOUNT NO.:  [id number]   MEDICAL RECORD NO.:  [id number]                   PATIENT TYPE:  INP   LOCATION:  A203                                 FACILITY:  [facility]   PHYSICIAN:  Hanley Hays. Dechurch, M.D.           DATE OF BIRTH:  1919/12/18   DATE OF ADMISSION:  02/16/2004  DATE OF DISCHARGE:  02/18/2004                                 DISCHARGE SUMMARY   DISCHARGE DIAGNOSES:  1. Bradycardia with weakness, iatrogenic.  2. History of paroxysmal atrial fibrillation.  3. Status post facial trauma secondary to fall.  4. Hypertension.  5. History of gastrointestinal bleed secondary to gastric ulcer thought to     be nonsteroidal anti-inflammatory drugs induced and history of     Helicobacter pylori.  6. Hyponatremia, multifactorial.   DISPOSITION:  The patient is discharged to home.   DISCHARGE MEDICATIONS:  1. Capoten 50 mg t.i.d.  2. Ziac one daily.  3. Clonidine 0.2 mg b.i.d.  4. Stop Verapamil and hydrochlorothiazide.   HOSPITAL COURSE:  The patient is a pleasant, 77 year old female who lives  independently who presented to the emergency room after having sudden onset  of weakness as she stood up.  She states she felt as though all her strength  had drained from her, but she had no presyncope or other neurologic type  symptoms.  Because the feeling did not resolve, she presented to the  emergency room where she was noted to have a heart rate of 40.  She had no  shortness of breath and she had no chest pain.  She had recently been placed  on Verapamil and since that time had generally not felt as good.  The  Verapamil was held and initially her Ziac was held, but this was resumed and  she tolerated it well.  Her pressures, although not optimal, were maintained  in the 160/80 range.  Heart rate in the 60s-70s.  She had no palpitations.  She was in sinus rhythm.  Cardiac enzymes were unremarkable, although her MB  relative fraction was  slightly elevated.  In a setting of normal CK and  troponin, it was not felt to be evidence of myocardial injury.  Sodium upon  presentation was 125 with a chloride of 92.  She did receive some saline  hydration.  She was mildly anemic with a hemoglobin 11.3.  No evidence of  blood loss and stools were heme negative.  Rectal exam was heme negative,  although she had no gross stools during the hospital stay.  The patient's  history of facial trauma occurred after tripping.  There was no evidence of  syncope.  It was felt that she could safely be discharged home, but was not  a good candidate for anticoagulation therapy should she have recurrent  atrial fibrillation.  She is not on any aspirin, this was discontinued by  cardiology per the patient.  CONDITION ON DISCHARGE:  The patient is discharged to home in stable  condition with the plan as noted above.   FOLLOW UP:  She will follow up with her primary care giver, Dr. [name],  who will adjust her outpatient medications.     ___________________________________________                                         Hanley Hays Josefine Class, M.D.   FED/MEDQ  D:  02/18/2004  T:  02/18/2004  Job:  [id number]   cc:   Jorja Loa, M.D.  [address]  [place]  [place] [address]  Fax: [phone]

## 2011-03-13 NOTE — Consult Note (Signed)
[name], [name]                ACCOUNT NO.:  [id number]   MEDICAL RECORD NO.:  [id number]          PATIENT TYPE:  INP   LOCATION:  A223                          FACILITY:  [facility]   PHYSICIAN:  Vida Roller, M.D.   DATE OF BIRTH:  07-16-1920   DATE OF CONSULTATION:  DATE OF DISCHARGE:                                   CONSULTATION   REFERRING PHYSICIAN:  Dr. Vania Rea.   PRIMARY CARE PHYSICIAN:  Dr. [name].   PRIMARY CARDIOLOGIST:  Dr. Lanare Bing.   HISTORY OF PRESENT ILLNESS:  [name] is an 77 year old female with history  of paroxysmal atrial fibrillation and hypertension who presented to [facility] ER with complaints of substernal chest discomfort.  She states  the chest discomfort awoke her from sleep on Saturday night.  She describes  this as substernal chest pain.  She states, It's the worst pain I've ever  had.  She denies any associated shortness of breath, nausea, vomiting or  diaphoresis.  She did call her neighbor across the street who is a Sales executive.  He took her blood pressure and she reports that it was in the  190s systolic.  She did have some tachy palpitations as well and EMS was  called for further evaluation and treatment.  On the way to the emergency  department she was started on oxygen and IV fluids and had relief of her  chest pain prior to arrival in the emergency department.  She has had no  recurrence of her pain since admission.   PAST MEDICAL HISTORY:  1.  Paroxysmal atrial fibrillation in March 2001, converted spontaneously.  2.  Persantine Cardiolite in March of 2001 revealed no ischemia, EF of 80%.  3.  Hypertension.  4.  History of GI bleed secondary to peptic ulcer disease.  5.  Ulcers in the left lower extremity.  6.  Venous insufficiency.  7.  Status post vaginal hysterectomy.  8.  Spinal stenosis in the lumbosacral spine, status post surgery.  9.  Status post cataract surgery in the left eye  recently.   Echocardiogram in 1997 revealed an EF of 55 to 60% with no wall motion  abnormalities and mild mitral regurgitation.   ALLERGIES:  CODEINE, causes nausea and vomiting.   MEDICATIONS PRIOR TO ADMISSION:  1.  Neurontin 100 mg t.i.d.  2.  Clonidine 0.2 mg b.i.d.  3.  Capoten 50 mg t.i.d.  4.  Ziac 5/6.5 mg q.d.  5.  Citrucel q.d.   MEDICATIONS IN THE HOSPITAL:  1.  Aspirin 325 mg q.d.  2.  Ziac q.d.  3.  Capoten 50 t.i.d.  4.  Lovenox 50 q.d.  5.  Neurontin 100 mg t.i.d.  6.  D5 half normal saline at 20 cc per hour.   SOCIAL HISTORY:  [name] is a widow who has two sons.  She lives alone in  [place].  She denies any history of tobacco abuse or excessive alcohol  use.   FAMILY HISTORY:  Mother deceased at 63 years old.  Father deceased at 8  years  old secondary to MI.  Two brothers with coronary disease.   REVIEW OF SYSTEMS:  Negative for fever and chills.  Negative for headaches,  nasal discharge and sore throat.  Positive for a rash on her back.  CARDIOPULMONARY:  Per HPI.  Positive dyspnea on exertion.  Negative  orthopnea, negative PND.  Occasional edema in the left lower extremity.  Occasional palpitations.  No pre/syncope.  No cough or wheezing.  GU:  No  frequency, urgency or dysuria.  NEURO/PSYCH:  Generalized weakness with  tachy palpitations and chest discomfort.  MUSCULOSKELETAL:  No myalgias.  Positive for arthralgias.  GI:  Negative nausea, vomiting, diarrhea, bright  red blood per rectum or melena.  Negative GERD symptoms.  All other systems  reviewed are negative.   PHYSICAL EXAMINATION:  VITAL SIGNS:  Temperature is 98; pulse is 50;  respirations 18; blood pressure 157/76; weight 109; oxygen saturation 97% on  two liters.  TELEMETRY:  Sinus rhythm 50 to 60 range.  GENERAL:  Well-developed, elderly female in no acute distress.  HEENT:  Normocephalic, atraumatic.  Pupils equal, round and reactive to  light.  Left eye with the lateral  deviation.  NECK:  No jugulovenous distension.  No carotid bruits.  CARDIOVASCULAR:  Regular rate and rhythm.  Physiologically split S2 and  positive S4.  LUNGS:  Clear to auscultation bilaterally without wheezes, rales or rhonchi.  SKIN:  Warm and dry with an erythematous, scaly rash on her back.  CLINICAL BREAST EXAM:  Deferred.  ABDOMEN:  Soft and nontender with active bowel sounds.  No rebound or  guarding is noted.  GU/RECTAL:  Deferred.  EXTREMITIES:  No cyanosis, clubbing or edema.  Distal pulses are intact  bilaterally.  Left lower extremity with dressing clean, dry and intact.  NEUROLOGIC:  She is alert and oriented times three with cranial nerves  grossly intact.   Chest x-ray reveals chronic changes; increased interstitial opacity;  question superimposed  interstitial edema.   Electrocardiogram reveals sinus rhythm at 61 beats per minute, normal PR  interval, QRS duration and QTC.  She does have small Q waves noted in 1 and  aVL, which are non-diagnostic.  She has some non-specific ST abnormalities,  including some minimal ST depression in the lateral leads.  She has poor R  wave progression as well.   LABORATORY DATA:  White blood cell is 5.0, hemoglobin 11.5, hematocrit 33.8,  platelets 198.  Sodium 130, potassium 4.2, chloride 95, CO2 27, BUN 20,  creatinine 1.2, glucose 115.  D-Dimer 1.40, calcium 8.8.  Cardiac enzymes -  First set, CK 87, MB 2.9, troponin 0.02.  Second set, CK 76, MB 2.9,  troponin 0.07.  Third set, CK 69, MB 3.5, troponin 0.05.   IMPRESSION AND PLAN:  1.  This is an 77 year old female with chest discomfort which is atypical.      She does have some mild ST depression in the lateral leads on      electrocardiogram.  Cardiac enzymes times three are not consistent with      acute myocardial infarction and her pain is now resolved with no      recurrence since admission.  She does not have a history of known     coronary artery disease.  However, her  cardiac risk factors include a      family history of hypertension and unknown lipid status.  Considering      her EKG changes with chest discomfort and cardiac risk factors, we  would      recommend further evaluation with a cardiac catheterization.  She is      unsure that she wants to have this done.  She wants to talk with her      family and we will discuss this with her further in the morning.  In the      meantime, she will remain on an aspirin daily.  We will give her      nitroglycerin as needed for chest discomfort.  We will also increase her      Lovenox to full anticoagulation dose of 50 mg q. 12h.  She has a lipid      profile that is pending.  2.  Hypertension - Suboptimally controlled on her current medical regimen.      We will increase her clonidine to 0.3 mg p.o. b.i.d.  3.  Paroxysmal atrial fibrillation - She is currently in sinus rhythm.  We      will continue on telemetry.  Continue aspirin and Lovenox.   The patient was interviewed and examined by Dr. Vida Roller.  He agrees  with the above assessment and plan.  We appreciate this consult and will be  happy to follow this patient along with you.     Amy   AB/MEDQ  D:  07/21/2004  T:  07/21/2004  Job:  [id number]

## 2011-03-13 NOTE — Op Note (Signed)
NAME:  [name], [name]                          ACCOUNT NO.:  [id number]   MEDICAL RECORD NO.:  [id number]                   PATIENT TYPE:  AMB   LOCATION:  DAY                                  FACILITY:  [facility]   PHYSICIAN:  Lionel December, M.D.                 DATE OF BIRTH:  Dec 04, 1919   DATE OF PROCEDURE:  02/26/2003  DATE OF DISCHARGE:                                 OPERATIVE REPORT   PROCEDURE:  Esophagogastroduodenoscopy.   INDICATIONS FOR PROCEDURE:  The patient is an 77 year old Caucasian female  who recently presented with epigastric pain, worse with meals, melena, and  she was also noted to have mild anemia.  She is undergoing diagnostic  esophagogastroduodenoscopy.  Her symptoms or pain have improved with  Prevacid.  The procedure was reviewed with the patient, and informed consent  was obtained.   PREOPERATIVE MEDICATIONS:  Cetacaine spray for pharyngeal topical  anesthesia, Demerol 25 mg IV, Versed 1.5 mg IV in divided doses.   INSTRUMENT USED:  Olympus video system.   FINDINGS:  The procedure was performed in the endoscopy suite.  The  patient's vital signs and O2 saturations were monitored during the procedure  and remained stable.  The patient was placed in the left lateral position.  The endoscope was passed via the oropharynx without any difficulty into the  esophagus.   Esophagus:  The mucosa of the esophagus was normal.  The GE junction was  unremarkable.   Stomach:  It was empty and distended very well with insufflation.  There  folds of the proximal stomach were unremarkable.  Examination of the mucosa  revealed granularity and erythema at body and antrum.  There was a large  scar at the gastric body along the posterior wall, and next to it was an  ulcer about 8 x 10 mm with a smooth base.  Endoscopically, it appeared to be  a benign ulcer.  The pyloric channel was patent.  The angularis, fundus, and  cardia were examined by retroflexing the scope and  were normal.   Duodenum:  Examination of the bulb and postbulbar duodenum was normal.  The  endoscope was withdrawn.  No biopsies were taken.  The patient tolerated the  procedure well.   FINAL DIAGNOSIS:  Gastric ulcer at body along with gastritis.  She also has  a large scar in the stomach.  Endoscopically, this ulcer appears benign.   RECOMMENDATIONS:  1. She will continue Prevacid at 30 mg p.o. q.a.m. and refrain from taking     any NSAIDs (she is not taking any).  2.     She will return for a followup EGD in 8-10 weeks from now.  She may also     consider having a screening colonoscopy at that time.  3. H pylori serology will be checked today.  Lionel December, M.D.    NR/MEDQ  D:  02/26/2003  T:  02/26/2003  Job:  [id number]   cc:   Jorja Loa, M.D.  [address]  [place]  [place] [address]  Fax: [phone]

## 2011-03-13 NOTE — H&P (Signed)
NAMEMarland [name]  [name], PERSON                ACCOUNT NO.:  [id number]   MEDICAL RECORD NO.:  [id number]          PATIENT TYPE:  INP   LOCATION:  2891                         FACILITY:  [facility]   PHYSICIAN:  Hilda Lias, M.D.   DATE OF BIRTH:  19-Jul-1920   DATE OF ADMISSION:  02/09/2007  DATE OF DISCHARGE:                              HISTORY & PHYSICAL   [name] is a lady who was seen in my office complaining of back pain  radiating to both her legs after she fell in January of this year.  The  patient had an x-ray of the hip.  Report that it was negative.  When she  came, she was in a wheelchair.  She was barely unable to move.  She was  in quite a bit of pain.   PAST MEDICAL HISTORY:  She has had laminectomy about 10 years ago.   She is allergic to SULFA AND CODEINE.   SOCIAL HISTORY:  Negative.   FAMILY HISTORY:  Unremarkable.   REVIEW OF SYSTEMS:  Positive for high blood pressure, arthritis, and  back pain.   PHYSICAL EXAMINATION:  HEAD, NOSE, AND THROAT:  Normal.  NECK:  Normal.  LUNGS:  Clear.  HEART:  Heart sounds normal.  There were normal __________, normal  pulse.  NEURO:  She has a weakness of the quadriceps.  Sensation is normal in  the upper and lower extremities.  Reflexes 1+ and no Babinski.  The x-  rays has a fracture of L2 with associated lumbar stenosis.   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   RECOMMENDATIONS:  The patient was to proceed with surgery because she  cannot live with the pain.  The main problem that she complains is that  every time she walks, she has to stop because the pain in both legs  associated with weakness and burning.  The procedure will be an L2  laminectomy and fusion from L1 to L3.  The surgery was fully explained  to her __________ with her including the possibility of __________ to do  surgery secondary osteoporosis, infection, damage to the vessels of the  abdomen,  need for further surgery, and all the risks associated with her  age.           ______________________________  Hilda Lias, M.D.     EB/MEDQ  D:  02/09/2007  T:  02/09/2007  Job:  [id number]

## 2011-03-13 NOTE — Op Note (Signed)
[name], [name]                ACCOUNT NO.:  [id number]   MEDICAL RECORD NO.:  [id number]          PATIENT TYPE:  AMB   LOCATION:  DAY                           FACILITY:  [facility]   PHYSICIAN:  Lionel December, M.D.    DATE OF BIRTH:  [birth year]   DATE OF PROCEDURE:  04/30/2005  DATE OF DISCHARGE:                                 OPERATIVE REPORT   PROCEDURE:  Esophagogastroduodenoscopy.   INDICATIONS:  [name] is an 77 year old Caucasian female who has had  intermittent solid food dysphagia for at least 4 months. She also has noted  difficulty with liquids at times but she denies heartburn or odynophagia.  She is undergoing diagnostic/therapeutic procedure. Procedure was reviewed  with the patient and informed consent was obtained.   PREMEDICATION:  Cetacaine spray for pharyngeal topical anesthesia, Demerol  15 mg IV, Versed 1 mg IV.   FINDINGS:  Procedure performed in endoscopy suite. The patient's vital signs  and O2 sat were monitored during procedure and remained stable. The patient  was placed in left lateral position. Olympus videoscope was passed per  oropharynx without any difficulty into esophagus.   Esophagus. Mucosa of the esophagus was normal throughout. Gastroesophageal  junction was located at 40 cm and no ring or stricture was obvious.   Stomach. It was empty and distended very well with insufflation. Folds of  proximal stomach were normal. Examination of mucosa revealed two tiny polyps  at gastric body along the anterior wall which were ablated via cold biopsy.  Mucosa of the rest of the stomach was normal. Pyloric channel was patent.  Angularis, fundus and cardia were examined by retroflexion of scope and were  normal.   Duodenum bulbar mucosa was normal. Scope was passed into second part of  duodenum. The mucosa and folds were normal. Endoscope was withdrawn.   Esophagus was dilated by passing 54-French Maloney dilator to full  insertion. As the dilator was  withdrawn, endoscope was passed again and no  mucosal injury noted in the esophagus.   The patient tolerated the procedure well.   FINAL DIAGNOSES:  1.  Normal exam of the esophagus. It was dilated by passing 54-French      Maloney dilator given history of dysphagia. It did not induce any      mucosal disruption.  2.  Small gastric polyps ablated via cold biopsy from gastric polyps at body      which were ablated via cold biopsy.   Please note that she has history of H. pylori infection that has been  treated the past.   RECOMMENDATIONS:  She will resume her usual meds including baby dose of ASA.  I will be contacting the patient with biopsy results. If she remains with  dysphagia, will consider further evaluation.       NR/MEDQ  D:  04/30/2005  T:  04/30/2005  Job:  [id number]   cc:   Jorja Loa, M.D.  [address]  [place]  [place] [address]  Fax: [phone]

## 2011-03-13 NOTE — Procedures (Signed)
NAMEANELIS, HRIVNAK                ACCOUNT NO.:  [id number]   MEDICAL RECORD NO.:  [id number]          PATIENT TYPE:  OUT   LOCATION:  RESP                          FACILITY:  [facility]   PHYSICIAN:  Edward L. Juanetta Gosling, M.D.DATE OF BIRTH:  [birth year]   DATE OF PROCEDURE:  DATE OF DISCHARGE:                              PULMONARY FUNCTION TEST   1.  Spirometry shows normal values with the exception of FEF 25/75 which is      mildly decreased.  2.  There was an equipment failure, so she could not get lung volumes and      diffusing done.      ELH/MEDQ  D:  02/19/2005  T:  02/20/2005  Job:  [id number]

## 2011-03-13 NOTE — Cardiovascular Report (Signed)
[name], HREHA                ACCOUNT NO.:  [id number]   MEDICAL RECORD NO.:  [id number]          PATIENT TYPE:  INP   LOCATION:  3710                         FACILITY:  [facility]   PHYSICIAN:  Rollene Rotunda, M.D.   DATE OF BIRTH:  [birth year]   DATE OF PROCEDURE:  DATE OF DISCHARGE:                              CARDIAC CATHETERIZATION   PRIMARY CARE PHYSICIAN:  Dr. Jorja Loa.   PROCEDURE:  Left-heart catheterized/ coronary arteriography.   INDICATIONS:  Evaluate patient with chest pain.   PROCEDURAL NOTE:  Left-heart catheterization was performed via the right  femoral artery.  The artery was cannulated using anterior wall puncture.  A  #6 French arterial sheath was inserted via the modified Seldinger technique.  Preformed Judkins and a pigtail catheter were utilized.  The patient  tolerated the procedure well, and left the laboratory in stable condition.   RESULTS:  HEMODYNAMICS:  LV 135/16, AO 146/86.   CORONARIES:  1.  LEFT MAIN:  The left main was normal.  2.  LAD:  The LAD had long, proximal calcification.  Proximal LAD 40%      stenosis.  The mid LAD had diffuse 25% stenosis.  The first and second      diagonal had moderate size with proximal 25% stenosis.  3.  CIRCUMFLEX:  The circumflex in the AV grove had proximal long 25%      stenosis.  OM1 and OM2 were small.  Posterolateral 1 and posterolateral      2 were moderate size and normal.  4.  RIGHT CORONARY ARTERY:        The right coronary artery was dominant.      There was ostial 25% stenosis, proximal 25% stenosis, and distal 30%      stenosis.   LEFT VENTRICULOGRAM:  The left ventriculogram was obtained in the RAO  projection.  Ejection fraction of 65% with normal wall motion.   CONCLUSION:  Nonobstructive coronary disease.   PLAN:  No further cardiovascular testing is suggested.  The patient will  continue with aggressive medical management.       JH/MEDQ  D:  07/22/2004  T:  07/22/2004  Job:   [id number]   cc:   Jorja Loa, M.D.  [address]  [place]  [place] [address]  Fax: [phone]   [place] Bing, M.D.

## 2011-03-13 NOTE — Discharge Summary (Signed)
NAMEFERNANDE, [name]                ACCOUNT NO.:  [id number]   MEDICAL RECORD NO.:  [id number]          PATIENT TYPE:  INP   LOCATION:  3710                         FACILITY:  [facility]   PHYSICIAN:  Salvadore Farber, M.D. LHCDATE OF BIRTH:  01/09/1920   DATE OF ADMISSION:  07/22/2004  DATE OF DISCHARGE:  07/23/2004                                 DISCHARGE SUMMARY   ADDENDUM:  This addendum concerns spiral CT scan which was done on July 23, 2004.  The study shows no definite filling defect in the pulmonary  arterial tree; therefore, fairly indicative of negative study for pulmonary  embolism.  However, VQ scan is recommended since contrast was injected late.  Also found small left and right pleural effusion, atherosclerotic  calcification of the thoracic aorta, and bi-apical parenchymal scarring.       GM/MEDQ  D:  07/23/2004  T:  07/23/2004  Job:  [id number]   cc:   Vida Roller, M.D.  Fax: [phone]   Jorja Loa, M.D.  [address]  [place]  [place] [address]  Fax: [phone]

## 2011-03-13 NOTE — Procedures (Signed)
[name], MUCH                ACCOUNT NO.:  [id number]   MEDICAL RECORD NO.:  [id number]          PATIENT TYPE:  INP   LOCATION:  A219                          FACILITY:  [facility]   PHYSICIAN:  Gerrit Friends. Dietrich Pates, MD, FACCDATE OF BIRTH:  04-08-1920   DATE OF PROCEDURE:  DATE OF DISCHARGE:                                ECHOCARDIOGRAM   CLINICAL DATA:  An 77 year old woman with some congestive heart failure  and hypertension.  M-mode aorta 2.7, left atrium 3.8, septum 1.0,  posterior wall 1.0, LV diastole 3.8, LV systole 2.4.  1. Technically adequate echocardiographic study.  2. Normal left atrium, right atrium and right ventricle; borderline      RVH.  3. Normal mitral valve.  4. Mild aortic valvular sclerosis; mild calcification of the proximal      ascending aorta.  5. Normal tricuspid valve; physiologic regurgitation; estimated RV      systolic pressure mildly elevated.  6. Normal pulmonic valve and proximal pulmonary artery.  7. Small right pleural effusion.  8. Normal IVC.  9. Normal internal dimension, wall thickness, regional and global      function of the left ventricle.  10.Comparison to prior study of July 07, 2005:  No significant      interval change.      Gerrit Friends. Dietrich Pates, MD, [facility]  Electronically Signed     RMR/MEDQ  D:  11/09/2006  T:  11/10/2006  Job:  [id number]

## 2011-03-13 NOTE — Consult Note (Signed)
[name], [name]                ACCOUNT NO.:  [id number]   MEDICAL RECORD NO.:  [id number]          PATIENT TYPE:  INP   LOCATION:  A312                          FACILITY:  [facility]   PHYSICIAN:  R. Roetta Sessions, M.D. DATE OF BIRTH:  [birth year]   DATE OF CONSULTATION:  11/25/2006  DATE OF DISCHARGE:                                 CONSULTATION   REASON FOR CONSULTATION:  Nausea, vomiting, diarrhea, hemoccult positive  stool, anemia.   HISTORY OF PRESENT ILLNESS:  [name] is a very pleasant 58-  year-old Caucasian female, a resident of [place], but more recently a  resident of [facility], admitted to the hospital last  evening with fever and cough.  Chest x-ray demonstrates left lower lobe  consolidation and infiltrate.  She was seen by Dr. Colon Branch in the ED and  admitted to the hospitalist service.   This lady was recently admitted to the hospital earlier this month with  leg pain, felt to be due to spinal stenosis.  She has been noted to have  progressive anemia as an outpatient.  Her hemoglobin several weeks ago  was in the 10 range, and it was 9.7 on November 18, 2006 through Dr.  Claire Shown office.   Her H&H on admission was 8 and 23.8, with MCV of 93.8.  That was  repeated later today, and it was 8.6 and 26.2.  White count was 16.4 and  14.4, respectively.  It is notable this lady was occult blood positive  as an outpatient.  We saw her in the office at the first of this month,  and we are making plans to move toward a colonoscopy, as it has been  some 3-1/2 years since she last had one.   Aside from some intermittent constipation, she was doing well clinically  until last evening, when she developed an incessant coughing episode  which was associated with an episode of nausea, vomiting, and a loose  nonbloody stool.  Since that time, nausea and vomiting have resolved,  and she has not had any more stooling.  She has not had any abdominal  pain.   She is febrile, and she has been started on antibiotic treatment  for pneumonia.   She may well have been given some Levaquin as an outpatient during the  past week.  She has had some vague symptoms of anorexia, and may have  lost a few pounds earlier in the month.  She says she feels like she has  gained them back.  She is not having any odynophagia, dysphagia, early  satiety, or reflux symptoms.  Stools have been chronically darker on  iron supplementation.   Her GI history is significant for a gastric ulcer, being seen by Dr.  Karilyn Cota, with EGD in 2004.  Biopsies were negative.  She was treated for  Helicobacter pylori.  In 2006, she had a follow-up EGD which  demonstrated complete healing of the ulcer.  Also, in 2004, she had a  colonoscopy which was done for screening, but she was found to have a  couple of  long linear sigmoid ulcers.  Biopsies were nonspecific.  Again, she really has not had any GI symptoms recently, aside from  chronic constipation, and the acute symptoms described above which  occurred last evening, which may well be self-limiting.   Phenergan has been ordered for nausea.  I do not see where she is on a  proton pump inhibitor.   PAST MEDICAL HISTORY:  1. Congestive heart failure.  2. Hypertension.  3. Atrial fibrillation.  4. History of peptic ulcer disease.  5. Status post Helicobacter pylori treatment.  6. History of arthritis.  7. Anemia.  8. Chronic low back pain.  9. Spinal stenosis.  10.Hypothyroidism.  11.History of coronary artery disease.   PAST SURGICAL HISTORY:  1. Hysterectomy.  2. Back surgery.  3. Endoscopic evaluation as outlined above.   MEDICATIONS ON ADMISSION:  1. Metoprolol.  2. Enalapril.  3. Amiodarone.  4. Catapres.  5. Plendil.  6. Lasix.  7. Neurontin.  8. Synthroid.  9. K-Dur.  10.Ultram.  11.Aspirin 81 mg daily.  12.Vitamin C.  13.Lipitor 10 mg daily.  14.Vitamin B1.  15.Lutein 60 mg daily.  16.MiraLax  p.r.n.  17.Hemacyte iron supplement.   ALLERGIES:  1. CODEINE.  2. SULFA.   She is not taking Coumadin.   FAMILY HISTORY:  Negative for chronic GI or liver illness.   SOCIAL HISTORY:  The patient is living in [place] for 33 years.  She  has been widowed for 5 years.  She has two children.  She does not  consume alcohol or use tobacco.   REVIEW OF SYSTEMS:  GI:  As outlined above.  She has some shortness of  breath, cough, and fever recently.  She has chronic leg pain and lower  extremity weakness, felt to be related to her spinal stenosis.   PHYSICAL EXAMINATION:  GENERAL:  A pleasant elderly female who was alert  and conversant.  Her left eye is deviated outward.  VITAL SIGNS:  Temperature 97.3, pulse 58, respiratory rate 20, BP  104/42, weight in kg 45.8 on admission.  SKIN:  Warm and dry.  There is no jaundice.  No __________ stigmata of  chronic liver disease.  HEENT:  No scleral icterus.  Conjunctivae are pink.  Oral cavity:  No  lesions.  CHEST:  She has rhonchi and wheezing, and diminished breath sounds at  the left lung base to auscultation.  CARDIAC:  Regular rate and rhythm, without murmur, gallop, or rub.  ABDOMEN:  Somewhat full.  She has good bowel sounds, somewhat  protuberant.  Entirely soft and nontender, without appreciable mass or  organomegaly.  She has trace lower extremity edema.   LABORATORY EVALUATION:  White count at 11:50 today 14.4, H&H 8.6 and  26.2, MCV 94.4, platelet count 345.  Sodium 130, potassium 4.9, chloride  97, CO2 25, glucose 120, BUN 30, creatinine 1.39, bilirubin 0.1 total,  alkaline phosphatase 105, AST 63, ALT 45, albumin 2.2, calcium 8.3.  B  natruretic peptide 341.  Urinalysis positive for protein 30 mg/dL, 3-6  white cells.   Incidentally, it is noted that she had an ultrasound on November 22, 2006  which revealed a normal-appearing gallbladder, CBD 5 mm.  Liver appeared normal.  There was minimal distention of the IVC and  hepatic veins,  suggesting right heart failure.   IMPRESSION:  [name] is a very pleasant 77 year old lady  admitted to the hospital with what appears to be pneumonia.  She may  have an element of  congestive heart failure as well.  Last evening, she  had a protracted coughing episode which was associated with an episode  of nausea, vomiting, and a loose nonbloody stool.  Those symptoms have  not recurred, and last evening was the first time she had those symptoms  acutely.  Her GI review of systems currently is significant for chronic  constipation.   She has been noted to have progressive anemia over the past month or so,  and she is noted to be documented to be fecal occult blood positive.   She has had no gross GI bleeding and is doing fairly well from a GI  standpoint.   She does have mildly elevate aminotransferases.   Her past GI workup is as chronicled above.  It has been some 3-1/2 years  since she had a colonoscopy, and I feel it would be reasonable to move  in the direction of an outpatient elective colonoscopy once she is over  her pneumonia.   RECOMMENDATIONS:  1. I would obtain an anemia profile if it has not already been done.  2. I would consider transfusing this lady under her clinical      circumstances to get her hemoglobin and hematocrit up into the 10      and 30 range.  It would facilitate management of her      cardiopulmonary problems.  3. I would institute empiric proton pump inhibitor therapy.  4. She has Phenergan ordered as needed for nausea.  I would recommend      this agent be avoided entirely, as the risk of untoward CNS side      effects outweighs the risk of this agent.  5. Eventually, outpatient colonoscopy.  6. I would proceed with stool studies if she has any more diarrhea.  7. I agree with rechecking her LFTs.   I would like to thank Dr. Lilian Kapur for allowing me to see this nice lady  today.      Jonathon Bellows, M.D.   Electronically Signed     RMR/MEDQ  D:  11/25/2006  T:  11/25/2006  Job:  [id number]   cc:   Hospitalist Service   Nicoletta Dress. Colon Branch, M.D.  Fax: [phone]

## 2011-03-13 NOTE — Discharge Summary (Signed)
[name], [name]                ACCOUNT NO.:  [id number]   MEDICAL RECORD NO.:  [id number]          PATIENT TYPE:  INP   LOCATION:  3011                         FACILITY:  [facility]   PHYSICIAN:  Hilda Lias, M.D.   DATE OF BIRTH:  1920/06/10   DATE OF ADMISSION:  02/09/2007  DATE OF DISCHARGE:  02/14/2007                               DISCHARGE SUMMARY   ADMISSION DIAGNOSES:  1. Fracture L2 with degenerative disc disease.  2. Lumbar stenosis.  3. Neurogenical claudication.   FINAL DIAGNOSES:  1. Fracture L2 with degenerative disc disease.  2. Lumbar stenosis.  3. Neurogenical claudication.   CLINICAL HISTORY:  The patient was admitted because of back pain with  mostly pain down to both legs, especially with walking.  She fell back  in January of this year.  She had been in a wheelchair since then.  X-  ray shows that she has a fracture of L2 with stenosis.  Because the  amount was __________ , surgery was advised.   LABORATORY DATA:  Normal.   HOSPITAL COURSE:  The patient was taken to surgery and a L2 laminectomy  was done and fusion with pedicle screws from L1 to L3 as well as  posterolateral arthrodesis was made.  The patient was in the intensive  care unit 48 hours.  Right now she is stable.  Normally, she is at the  nursing facility at [facility] where they do have physical  therapy and occupational therapy.  She is going to be discharged, to be  followed by me in 4 weeks.  Orders on the chart unchanged.   ACTIVITY:  She is to continue physical therapy and occupational therapy,  hopefully to increase the strength of her lower extremities, and allow  her to walk.  The patient should have the brace every time she is out of  bed.  She can sleep without.   MEDICATIONS:  She will be taking the same medications that she came with  from [facility].  The only thing to add will be Percocet 5 mg  every 4 hours as needed for pain.   DIET:  Regular diet.   FOLEY CATHETER:  She is with a Foley catheter.  Attempts should be made  to remove the catheter in the next 24 hours, but I will leave it up to  her prior physician.   FOLLOWUP:  She is going to be seen by me in 4-6 weeks.  Today, prior to  discharge, AP and lateral view of the lumbar spine shows good position  of the pedicle screws and at the level L1, L2, L3.           ______________________________  Hilda Lias, M.D.     EB/MEDQ  D:  02/14/2007  T:  02/14/2007  Job:  [id number]

## 2011-03-13 NOTE — Discharge Summary (Signed)
[name], [name]                ACCOUNT NO.:  [id number]   MEDICAL RECORD NO.:  [id number]          PATIENT TYPE:  INP   LOCATION:  A312                          FACILITY:  [facility]   PHYSICIAN:  Osvaldo Shipper, MD     DATE OF BIRTH:  05/03/1920   DATE OF ADMISSION:  11/25/2006  DATE OF DISCHARGE:  02/06/2008LH                               DISCHARGE SUMMARY   The patient lives at [facility] where she is followed by  Dr. Franchot Heidelberg.   DISCHARGE DIAGNOSES:  1. Congestive heart failure, likely secondary to diastolic      dysfunction, improved.  2. Possible community-acquired pneumonia, stable.  3. Spinal stenosis requiring outpatient follow up.  4. Hypertension, improved.  5. Anemia.  6. Atrial fibrillation.  7. Hypothyroidism. All stable.  8. History of paroxysmal atrial fibrillation, stable.  9. Possible history of peripheral vascular disease.   Please review H&P dictated by Dr. Lilian Kapur.   BRIEF HOSPITAL COURSE:  1. Pulmonary. The patient is an 77 year old Caucasian female who has      history of CHF thought to be diastolic dysfunction, history of      hypertension, atrial fibrillation, peptic ulcer disease, irritable      bowel syndrome, arthritis, anemia, chronic back pain, spinal      stenosis, hypothyroidism and coronary artery disease who was doing      well. Actually was discharged from this hospital on January 21 and      was doing well in the nursing home when she presented complaining      of nausea, vomiting and diarrhea. The patient was also short of      breath. She was put on antibiotics as there was a thought that this      was pneumonia. The patient was continued on Levaquin. Chest x-ray      also suggested pulmonary edema. Hence, the patient was started on      IV Lasix as well. Her Lasix dose was increased. Her BNP was found      to be elevated at 499. With IV Lasix, antibiotics, nebulizer      treatments and steroids, the patient has  continued to show      improvement. Her BNP was down to 110 yesterday. Review of her      echocardiogram report from January 15, showed that she actually had      normal systolic function of the left ventricle. She had some      evidence for pulmonary hypertension. Hence, the patient likely has      mild diastolic dysfunction. Her blood pressures were also elevated.      Hence, a combination of these could have tipped her over into      pulmonary edema. In any case, the patient's pulmonary status      significantly improved. She is saturating more than 95% on 1 to 2      liters by nasal cannula. Her blood pressures are much improved.      Overall, she has shown significant improvement.  2. Nausea, vomiting, diarrhea also subsided  gradually. Etiology for      this is not very clear at this point. Stool studies unfortunately      were not done. Currently, the patient is complaining of mild      constipation. She says she has not moved her bowel in about three      to four days now. She will be put back on her laxatives with which      hopefully she should have a BM in the next day or two.  3. History of spinal stenosis. The patient has a lot of lower      extremity weakness as a result of her spinal stenosis. Physical      therapy was constantly following this patient on a daily basis. We      did attempt to make an appointment with a neurosurgeon in      [facility] for this condition that the patient has. The patient,      however, would like to defer this matter to her medical doctor at      the nursing home. Hence, this is something Dr. Erby Pian will have      to follow up.  4. Renal function. She did have mildly elevated BUN and creatinine      when she was admitted, although this is down to normal now.  5. Elevated LFTs. She was noted to have significantly elevated LFTs      during her previous admission. All of them started trending down      during this admission. This was thought  to be secondary to hepatic      congestion more than anything else.  6. Anemia. Also remains stable. Her hemoglobin was 8.0 on presentation      and came up to about 10.6. The patient did not require any      transfusions. Gastroenterology did see the patient, and they      recommended an outpatient colonoscopy. Stool for occult blood was      unfortunately not done.  7. She has a history of paroxysmal atrial fibrillation, coronary      artery disease which all remained stable. She also had mild      hypokalemia during this admission which was also easily corrected.   Today, the patient is feeling a little bit tired, but otherwise, she is  doing quite well. She has improved significantly. Her lungs are very  clear today. Hence, she is considered stable to go back to the nursing  home.   DISCHARGE MEDICATIONS:  1. Albuterol nebulizers 2.5 mg every 6 hours. This may be continued      for 5 days and then may cut it down to every to 8 hours 3 times      daily.  2. Atrovent 0.5 mg nebulizers every 6 hours for 5 days and then may      cut it down to 3 times daily.  3. Levaquin 500 mg p.o. daily for 3 more days.  4. Prednisone 30 mg for 3 days, followed by 20 mg for 3 days, followed      by 10 mg for 3 days and then discontinue.  5. Aspirin 81 mg p.o. once daily, enteric coated.  6. Vitamin C 500 mg p.o. daily.  7. Artificial Tears to both eyes once daily.  8. Lipitor 10 mg p.o. daily; please hold this for now; please repeat a      LFT in a week or so, and then may restart  it at that point and      would recommend rechecking her liver function tests once she is on      her Lipitor.  9. She will continue her lutein 6 mg p.o. daily.  10.Continue Hemocyte Plus 1 tablet p.o. daily.  11.Os-Cal D 1 tablet b.i.d.  12.Toprol-XL 25 mg once daily.  13.Enalapril 2.5 mg p.o. daily.  14.Amiodarone 100 mg p.o. daily.  15.Catapres 0.1 mg b.i.d.  16.Plendil 10 mg p.o. daily. 17.Lasix 40 mg p.o. once  a day.  18.Neurontin 300 mg p.o. daily.  19.Synthroid 75 mcg p.o. daily.  20.Potassium chloride 20 mEq p.o. daily.  21.Colace 100 mg p.o. b.i.d.  22.Dulcolax 5 mg if no bowel movement in 1 day.  23.She was taking Ultram 50 mg every 4 hours as needed for pain. This      may be continued.   FOLLOW UP:  1. With Dr. Karilyn Cota or any other gastroenterologist in the next 3 to 4      weeks for a colonoscopy.  2. Dr. Erby Pian to follow the patient at the nursing home.   DIET:  She can have a heart healthy diet.   PHYSICAL ACTIVITY:  She needs to have ongoing physical therapy.   OTHER RECOMMENDATIONS:  We will recommend that patient be set up to see  a neurosurgeon at [facility] for her spinal stenosis to see if there is  any surgical that can be offered to her, although her cardiac status  would probably put her at a high risk.   Other imaging studies done included lower extremity Doppler which was  negative for DVT and a repeat x-ray which showed actually worsening in  her CHF after which the Lasix was actually increased.   Total time spent at discharge 40 minutes.      Osvaldo Shipper, MD  Electronically Signed     GK/MEDQ  D:  12/01/2006  T:  12/01/2006  Job:  [id number]   cc:   Franchot Heidelberg, M.D.   Lionel December, M.D.  [address]  [place]  [place] [address]

## 2011-03-13 NOTE — H&P (Signed)
[place]  Patient:    [name], [name].                      MRN: Adm. Date:  01/18/00 Dictator:   Abelino Derrick, P.A.-C. [facility]                         History and Physical  DATE OF BIRTH:  [birth year]  CHIEF COMPLAINT:  Irregular heart rate.  HISTORY OF PRESENT ILLNESS:  Ms. Wormley is a pleasant 77 year old female transferred from [facility] where she presented this morning with rapid atrial fibrillation.  She converted spontaneously after arriving in the emergency room. She says she woke up at 7 a.m. and was starting her coffee and suddenly felt very weak.  She also had some chest discomfort described as tightness and weakness at both arms.  She did not have diaphoresis.  She did have some nausea.  In the emergency room at [facility], she was in rapid atrial fibrillation with diffuse ST changes.  Her rate was 188.  She converted spontaneously to sinus rhythm, and now her electrocardiogram shows a normal sinus rhythm with no acute  changes.  She is transferred now for further evaluation.  She had a similar episode four or five years ago, and was seen at [facility] by [place] and had an exercise test and an echocardiogram that she  says was normal.  She was never on Coumadin.  There is no history of coronary artery disease or previous chest pain or exertional symptoms.  PAST MEDICAL HISTORY: 1. Hypertension. 2. Remote hysterectomy.  CURRENT MEDICATIONS: 1. Ziac. 2. Estrogen. 3. Calcium. 4. Vitamin E.  ALLERGIES:  No known drug allergies.  She is intolerant to codeine and sulfa because of nausea.  SOCIAL HISTORY:  She is married.  Never smoked.  FAMILY HISTORY:  Remarkable for coronary artery disease.  Her father died at age 26 of a myocardial infarction.  She has two brothers with coronary artery disease.  REVIEW OF SYSTEMS:  Essentially unremarkable except for noted above.  There  is o history of thyroid problems.  No history of peptic ulcer disease or GI bleeding. She has been told she has high cholesterol in the past, but this has not been checked recently.  She has had a recent upper respiratory infection but no fever or chills.  She has not taken any over-the-counter medications.  PHYSICAL EXAMINATION:  VITAL SIGNS:  Blood pressure 158/68, pulse 66, respirations 18, temperature 96.6 degrees.  GENERAL:  She is a well-developed, thin female, in no acute distress.  HEENT:  Normocephalic.  Extraocular movements intact.  Sclerae anicteric.  NECK:  Without bruit, without jugular venous distention.  CHEST:  Clear to auscultation and percussion.  She does have some dry basilar crackles.  CARDIAC:  A regular rate and rhythm without murmur, rub, or gallop.  Normal S1, S2.  ABDOMEN:  Nontender.  No hepatosplenomegaly.  No bruit.  EXTREMITIES:  Without edema.  Distal pulses intact.  There is no femoral artery  bruit.  NEUROLOGIC:  Grossly intact.  She is awake, alert, oriented, cooperative.  Electrocardiogram:  As noted above.  LABORATORY DATA:  From [facility] including a TSH, are essentially within normal limits.  White count 6.7, hemoglobin 13.7, hematocrit 40.4, platelets 172. D-dimer is less than 0.25.  INR 1.05.  Sodium 134, potassium 4.2, BUN 13, creatinine 1.0.  TSH 3.69.  CPK-MB and troponin negative.  IMPRESSION: 1. Atrial fibrillation with chest pain, rule out coronary artery disease. 2. Hypertension. 3. History of hyperlipidemia in the past.  PLAN:  Will admit to telemetry.  Will add aspirin and beta blocker and set up for an adenosine Cardiolite tomorrow. DD:  01/18/00 TD:  01/18/00 Job: [id number]

## 2011-03-13 NOTE — Procedures (Signed)
NAME:  [name], [name]                          ACCOUNT NO.:  [id number]   MEDICAL RECORD NO.:  [id number]                   PATIENT TYPE:  INP   LOCATION:  A203                                 FACILITY:  [facility]   PHYSICIAN:  Edward L. Juanetta Gosling, M.D.             DATE OF BIRTH:  10-Jan-1920   DATE OF PROCEDURE:  DATE OF DISCHARGE:  02/18/2004                                EKG INTERPRETATION   TIME:  February 16, 2004, at 1718.   FINDINGS:  1. The rhythm is sinus rhythm with a rate in the 60's.  2. There was somewhat slow R wave progression across the precordium which     may indicate ischemic.  3. ST-T wave changes are less prominent than on the previous tracing.      ___________________________________________                                            Oneal Deputy. Juanetta Gosling, M.D.   ELH/MEDQ  D:  02/20/2004  T:  02/21/2004  Job:  [id number]

## 2011-03-13 NOTE — Op Note (Signed)
NAME:  RITAL, CAVEY NO.:  [id number]   MEDICAL RECORD NO.:  [id number]                    PATIENT TYPE:   LOCATION:                                       FACILITY:   PHYSICIAN:  Lionel December, M.D.                 DATE OF BIRTH:   DATE OF PROCEDURE:  05/18/2003  DATE OF DISCHARGE:                                 OPERATIVE REPORT   PROCEDURE:  Total colonoscopy.   ENDOSCOPIST:  Lionel December, M.D.   INDICATIONS:  This patient is an 77 year old Caucasian female who is here  for screening exam.  She decided to have this study because she has been  having explosive nonbloody diarrhea.  She had been on Cipro several weeks  ago, but diarrhea has continued even though it stopped a few weeks ago.  Family history is negative for colorectal carcinoma.  The procedures were  reviewed with the patient and informed consent was obtained.   PREOPERATIVE MEDICATIONS:  Demerol 35 mg IV and Versed 2 mg IV.   FINDINGS:  Procedure performed in endoscopy suite.  The patient's vital  signs and O2 saturation were monitored during the procedure and remained  stable.  The patient was placed in the left lateral recumbent position and  rectal examination performed.  No abnormality noted on external or digital  exam.   Olympus video scope was placed in the rectum and advanced under vision into  the sigmoid colon and beyond.  The sigmoid colon was tortuous.  Slowly and  carefully the scope was advanced proximally.  The preparation was excellent.  The scope was passed into the cecum which was identified by appendiceal  orifice and the ileocecal valve.  Pictures were taken for the record.  As  the scope was withdrawn the colonic mucosa was, once again, carefully  examined.  It was normal except that there were 2 linear ulcers in the  sigmoid colon each over 2 cm long.  Biopsy was taken from these ulcers for  routine histology.  Because the rest of the sigmoid colon and  rectum was  normal, the scope was retroflexed to examine the anorectal junction and  small hemorrhoids were noted below the dentate line.   The endoscope was straightened and withdrawn.  The patient tolerated the  procedure well.   FINAL DIAGNOSES:  1. Two long linear ulcers at the midsigmoid colon, otherwise normal     colonoscopy.  2. Small external hemorrhoids.  3. Suspect that these could also be related to some recent infection.  Doubt     inflammatory bowel disease.    RECOMMENDATIONS:  1. A high fiber diet and Citrucel 1 tablespoonful daily.  2. Imodium 1-2 mg p.o. q.a.m.  3. I will contact the patient with biopsy results and further     recommendations.  Lionel December, M.D.    NR/MEDQ  D:  05/18/2003  T:  05/18/2003  Job:  [id number]   cc:   Jorja Loa, M.D.  [address]  [place]  [place] [address]  Fax: [phone]

## 2011-03-13 NOTE — Discharge Summary (Signed)
[name], TARPLEY                ACCOUNT NO.:  [id number]   MEDICAL RECORD NO.:  [id number]          PATIENT TYPE:  INP   LOCATION:  A219                          FACILITY:  [facility]   PHYSICIAN:  Skeet Latch, DO    DATE OF BIRTH:  27-Jul-1920   DATE OF ADMISSION:  11/08/2006  DATE OF DISCHARGE:  01/21/2008LH                               DISCHARGE SUMMARY   DISCHARGE DIAGNOSES:  1. Intractable back and leg pain.  2. Congestive heart failure.  3. Hypertension.  4. Atrial fibrillation.  5. Hypothyroidism.  6. Hyperlipidemia.   BRIEF HOSPITAL COURSE:  [name] is an 77 year old Caucasian female who  presented with intractable left sided back and leg pain.  The patient  has a history of spinal stenosis, has been having trouble with her legs  and back for quite some time.  The patient was admitted for intractable  pain, resistant to outpatient therapy.  The patient was admitted, placed  on pain medication.  The pain slowly improved. The patient did have  radiologic studies done.  The patient did have a left hip x-ray which  was negative.  The patient had an MRI of her left hip which also was  negative.  The patient was also found to be in congestive heart failure  on admission.  Her BNP was elevated and has come back down to normal.  The patient still has some persistent left sided pain but it has  improved since admission.  The patient did have a run of V-tac one  episode during her stay.  Cardiology was consulted and the patient was  started on a beta blocker.  She had no recurrent episodes of this.  The  patient was stable and ready for discharge to a nursing home for skilled  nursing.   DISCHARGE MEDICATIONS:  Her medications on discharge will include:  1. Toprol XL 25 mg by mouth daily.  2. Enalapril 2.5 mg daily.  3. Amiodarone 100 mg daily.  4. Catapres 0.1 mg by mouth b.i.d.  5. Plendil 10 mg by mouth daily.  6. Lasix 20 mg daily.  7. Neurontin 300 mg daily.  8.  Synthroid 75 mcg daily.  9. K-Dur 20 mg daily.  10.Ultran, 1-2 tabs by mouth 4-6 as needed.  11.Aspirin 81 mg daily.  12.Vitamin C 500 mg daily.  13.Artificial Tears 1 drop daily.  14.Lipitor 10 mg daily.  15.Vitamin D 1 tab by mouth daily.  16.Lutein 6 mg by mouth daily.  17.GlycoLax 1 pill as needed.  18.Hemocyte Plus 1 tab by mouth daily.   DIET:  The patient is to have a low salt, 2 gm sodium, low fat diet, and  limited on any type of alcohol intake.   ACTIVITY:  Record her daily weights and increase activity as tolerated.   FOLLOW UP:  The patient is to follow up [facility] in 1-2  weeks.  Also to follow up with Dr.  Alton Revere in 1-2 weeks.  The patient  was instructed to followup in the emergency room  or with her primary  care physician for any  shortness of breath or symptoms return.      Skeet Latch, DO  Electronically Signed     SM/MEDQ  D:  11/15/2006  T:  11/15/2006  Job:  [id number]

## 2011-03-13 NOTE — Op Note (Signed)
NAMERAYSHELL, GOECKE                ACCOUNT NO.:  [id number]   MEDICAL RECORD NO.:  [id number]          PATIENT TYPE:  AMB   LOCATION:  DAY                           FACILITY:  [facility]   PHYSICIAN:  Lionel December, M.D.    DATE OF BIRTH:  Oct 22, 1920   DATE OF PROCEDURE:  01/11/2007  DATE OF DISCHARGE:                               OPERATIVE REPORT   PROCEDURE:  Esophagogastroduodenoscopy followed by colonoscopy.   INDICATIONS:  [name] is 77 year old Caucasian female who has iron-  deficiency anemia and heme-positive stools.  She was hospitalized about  2 months ago with respiratory problems.  We saw her in consultation and  delayed her GI workup until recovery.  She is here to have EGD and  colonoscopy.  She has remote history of peptic ulcer disease.  She is on  low-dose ASA.  She has been on ibuprofen which was discontinued few  weeks ago.  There is no history of melena or rectal bleeding.  Procedure  risks were reviewed with the patient and informed consent was obtained.   MEDS FOR CONSCIOUS SEDATION:  Benzocaine spray for pharyngeal topical  anesthesia, Demerol 25 mg IV, Versed 2 mg IV.   FINDINGS:  Procedure performed in endoscopy suite.  The patient's vital  signs and O2 sat were monitored during procedure and remained stable.   PROCEDURE:  1. Esophagogastroduodenoscopy.  The patient was placed left lateral      position and Pentax videoscope was passed via oropharynx without      any difficulty into esophagus.   Esophagus.  Mucosa of the esophagus was normal.  GE junction was at 36  cm and hiatus was 38.  She has small sliding hiatal hernia.   Stomach was empty and distended very well insufflation.  Folds proximal  stomach were normal.  Examination mucosa at body, antrum, pyloric  channel as well as angularis, fundus and cardia was normal.   Duodenum.  Bulbar mucosa was normal.  Scope was passed into the second  part of the duodenum where mucosa and folds were normal.   Endoscope was  withdrawn and the patient prepared for the procedure #2.   Colonoscopy.   Rectal examination performed.  No abnormality noted external or digital  exam.  Pentax videoscope was placed rectum and advanced under vision  into sigmoid colon beyond.  Preparation was excellent.  Tortuous colon.  By using abdominal pressure and a different position I was able to pass  the scope and advance the scope into cecum which was identified by  appendiceal orifice and ileocecal valve.  Pictures taken for the record.  Colonic mucosa was carefully examined on the way out and there were no  polyps, tumor masses or AV malformations.  Rectal mucosa similarly was  normal.  Scope was retroflexed to examine anorectal junction and she had  a single small anal papilla and hemorrhoids below the dentate line.  Endoscope was straightened withdrawn.  The patient tolerated the  procedures well.   FINAL DIAGNOSIS:  Small sliding hiatal hernia, otherwise normal  esophagogastroduodenoscopy.   Normal colonoscopy except small external hemorrhoids  and anal papilla.   Suspect she may have GI angiodysplasia resulting in her anemia and heme-  positive stools.   RECOMMENDATIONS:  She will resume her low-dose ASA and PPI and iron  therapy as before.   If her H&H can be maintained in respectable level with these measures  will not pursue with further evaluation.  However if her H&H drops and  she needs PRBC's, would consider small bowel given capsule study.      Lionel December, M.D.  Electronically Signed     NR/MEDQ  D:  01/11/2007  T:  01/12/2007  Job:  [id number]   cc:   Franchot Heidelberg, M.D.

## 2011-03-13 NOTE — H&P (Signed)
[name], [name]                ACCOUNT NO.:  [id number]   MEDICAL RECORD NO.:  [id number]          PATIENT TYPE:  INP   LOCATION:  A219                          FACILITY:  [facility]   PHYSICIAN:  Skeet Latch        DATE OF BIRTH:  1919-11-23   DATE OF ADMISSION:  11/08/2006  DATE OF DISCHARGE:  LH                              HISTORY & PHYSICAL   PRIMARY CARE PHYSICIAN:  Franchot Heidelberg, M.D.   CHIEF COMPLAINT:  1. Acute exacerbation of congestive heart failure.  2. Intractable back pain/leg pain.   HISTORY OF PRESENT ILLNESS:  [name] is an 77 year old female who  presents to the hospital complaining of left sided back pain with  radiation to her bilateral lower extremities, left greater than right.  The patient states that approximately three days ago she did have a fall  at her home.  She did go to the emergency room for evaluation and was  sent home.  The patient states that she fell again today while using her  walker, and due to excruciating back pain and leg pain decided to come  to the emergency room for evaluation.  The patient was seen in the  emergency room.  X-rays were taken which showed no fracture or  dislocations.  The patient also had a chest x-ray which showed probable  CHF.  The patient still is complaining of back pain and lower extremity  pain on exam.   PAST MEDICAL HISTORY:  1. Hypertension.  2. Atrial fibrillation.  3. Gastric ulcer.  4. Irritable bowel syndrome.  5. Arthritis.  6. Anemia.  7. Chronic back pain.  8. Hypothyroidism.  9. Coronary artery disease .   PAST SURGICAL HISTORY:  1. Back surgery.  2. Hysterectomy.   MEDICATIONS:  1. Clonidine 0.2 mg two tablets daily.  2. Amiodarone 100 mg daily.  3. Felodipine 10 mg daily.  4. Gabapentin 100 mg p.o. t.i.d.  5. Synthroid 75 mcg one tablet p.o. daily.  6. Lovastatin 40 mg daily.  7. HCTZ 25 mg daily.  8. Aspirin 81 mg daily.  9. Caltrate two tablets daily.  10.Vitamin C and  D one tablet each daily.  11.Lutein 6 mg p.o. daily.  12.GlycoLax one tablet p.o. p.r.n.  13.__________ tabs one tab p.o. daily.   ALLERGIES:  CODEINE AND SULFA.   FAMILY HISTORY:  Noncontributory.   SOCIAL HISTORY:  She does live at home.  She lives alone.  She has two  sons.  She denies any tobacco or illicit drug use.   PHYSICAL EXAMINATION:  VITAL SIGNS:  Temperature 99.7, pulse 71,  respirations 20, blood pressure 134/56, saturations 97% on 2 liters.  GENERAL:  She is an 77 year old thin female who looks apparent age, who  is alert and oriented x3, pleasant and cooperative in no acute distress.  HEENT:  Sclerae nonicteric.  She had a left eye deviation laterally.  Oropharynx was pink and moist without any lesions.  She does wear  dentures.  NECK:  Supple with no masses.  No thyromegaly or JVD noted.  HEART:  Regular rate and rhythm.  No murmurs, rubs, clicks or gallops.  LUNGS:  Clear bilaterally.  ABDOMEN:  Tender, generalized.  She had positive bowel sounds.  Nondistended.  No rebound tenderness or guarding.  EXTREMITIES:  Multiple ecchymoses noted.  The lower extremities did have  a new lesion on the left ankle.   LABORATORY DATA:  White blood cell count 8.3, hemoglobin 11.0,  hematocrit 33.4, platelets 361,000.  Sodium 136, potassium 3.2, chloride  94, CO2 32, glucose 106,  BUN 32, creatinine 1.0.  BNP 363 . Calcium  9.2.   ASSESSMENT:  1. Acute exacerbation of congestive heart failure.  2. Intractable back pain with radiation to lower extremities.  3. Hypertension.  4. Atrial fibrillation on Amiodarone.  5. Hypothyroidism.  6. Hyperlipidemia.   PLAN:  The patient will be admitted to general medical bed with  diagnosis of CHF and intractable back pain.  Will hep-lock IV.  Will get  blood cultures x2.  Will get a 2D echo if one has not been done in the  last 3-6 months.  Get daily weights and I's and O's.  The patient will  get Lasix 6 mg IV daily.  Place patient  on her home medications for  blood pressure, hypothyroidism and atrial fibrillation.  For her  hypokalemia, will replace p.o.  The patient may need inpatient rehab  with case management prior to discharge.  Also, will put patient on DVT  prophylaxis.                                           ______________________________  Skeet Latch    SM/MEDQ  D:  11/08/2006  T:  11/09/2006  Job:  [id number]

## 2011-03-13 NOTE — Discharge Summary (Signed)
[name], [name]                ACCOUNT NO.:  [id number]   MEDICAL RECORD NO.:  [id number]          PATIENT TYPE:  INP   LOCATION:  A226                          FACILITY:  [facility]   PHYSICIAN:  Marcello Moores, MD   DATE OF BIRTH:  25-Jan-1920   DATE OF ADMISSION:  01/16/2007  DATE OF DISCHARGE:  LH                               DISCHARGE SUMMARY   PRIMARY CARE PHYSICIAN:  Franchot Heidelberg, M.D., [facility].   ADMISSION AND HOSPITAL COURSE:  [name] is an 77 year old female  patient from [facility] who came with shortness of breath  and cough of 2 days duration.  She was admitted as a case of aspiration  pneumonia, and she was put on antibiotics, and the patient improved  well.  Her hospital stay was very smooth.  She remained afebrile, the  cough subsided, shortness of breath subsided, and she is ready to go  home today.  The patient stated that she has an appointment tomorrow  also for her back pain somewhere in [place], and she is urging to be  discharged.   PHYSICAL EXAMINATION:  GENERAL:  On physical examination today, the  patient is stable.  VITAL SIGNS:  Blood pressure is 154/61, temperature 98.8, pulse rate is  69 and respiratory rate is 20 per minute.  She is saturating 95% on room  air.  HEENT:  She has pink conjunctivae.  Nonicteric sclerae.  NECK:  Supple.  CHEST:  Good air entry bilaterally with scattered creps on the basal  area.  CVS:  S1-S2 well heard, regular.  ABDOMEN:  Soft.  No area of tenderness.  Normoactive bowel sounds.  EXTREMITIES:  No pedal edema.  Peripheral pulse positive.  Multiple  telangiectasias on the upper and lower extremities.  CNS:  She is alert and well oriented.   LABORATORY DATA:  CBC - white blood cell 7.9, hemoglobin is 9.4 and  hematocrit is 28.3, and platelet count is 294.  Chemistry - sodium is  136 and potassium is 3.8.  Chloride 103, bicarbonate 28 and glucose is  103.  BUN is 8 and  creatinine 0.9.  Calcium level is 8.5.  Blood culture  is negative.   ASSESSMENT:  1. Aspiration pneumonia, resolving.  2. Congestive heart failure, compensated.  3. Hypertension, fairly controlled.  4. Atrial fibrillation, stable.  5. Irritable bowel syndrome, stable.  6. Arthritis, stable.  7. Anemia, chronic.  8. Hypothyroidism, stable.  9. History of coronary artery disease.   HOME MEDICATIONS:  1. Clonidine 0.10 mg b.i.d.  2. Amiodarone 100 mg p.o. daily.  3. Plendil 10 mg p.o. daily.  4. Gabapentin 300 mg once daily.  5. Synthroid 75 mcg once a day.  6. Lovastatin 40 mg once a day.  7. Hydrochlorothiazide 25 mg once a day.  8. Toprol 25 mg once a day.  9. Aspirin 81 mg once a day  10.Lasix 40 mg once a day.  11.Vitamin C and calcium 1 tablet b.i.d.  12.Ultram 50 mg every 6 hours p.r.n.  13.Clindamycin 450 mg p.o. t.i.d. for 5  days.  14.Zithromax 500 mg p.o. daily for 5 days.  15.Artificial Tears 2 drops in each eye at bedtime.   DISPOSITION:  The patient is a nursing home resident at [facility], and she will be discharged back to her place, and she will have  follow-up with her primary medical doctor, Dr. Erby Pian.   TIME FOR DISCHARGE:  Forty minutes.      Marcello Moores, MD  Electronically Signed     MT/MEDQ  D:  01/18/2007  T:  01/18/2007  Job:  [id number]   cc:   Franchot Heidelberg, M.D.

## 2011-03-13 NOTE — Procedures (Signed)
NAMECLYTIE, [name]                ACCOUNT NO.:  [id number]   MEDICAL RECORD NO.:  [id number]          PATIENT TYPE:  EMS   LOCATION:  ED                            FACILITY:  [facility]   PHYSICIAN:  Edward L. Juanetta Gosling, M.D.DATE OF BIRTH:  Jun 27, 1920   DATE OF PROCEDURE:  DATE OF DISCHARGE:                                EKG INTERPRETATION   IMPRESSION:  05:39 February 02, 2005.  The rhythm is sinus rhythm with rate in the 50's. There are marked T wave  abnormalities.  There are multiple premature atrial contractions.  Q waves  are seen in V1, small R waves in V2.  There are ST-T wave abnormalities as  well.  This could indicate previous infarction and clinical correlation is  suggested.  Abnormal electrocardiogram.      ELH/MEDQ  D:  02/02/2005  T:  02/02/2005  Job:  [id number]

## 2011-03-13 NOTE — H&P (Signed)
[name], [name]                ACCOUNT NO.:  [id number]   MEDICAL RECORD NO.:  [id number]          PATIENT TYPE:  INP   LOCATION:  A223                          FACILITY:  [facility]   PHYSICIAN:  Melvyn Novas, MDDATE OF BIRTH:  15-Jul-1920   DATE OF ADMISSION:  07/20/2004  DATE OF DISCHARGE:  LH                                HISTORY & PHYSICAL   HISTORY OF PRESENT ILLNESS:  The patient is an 77 year old white female who  lives alone.  A patient of Dr. [name].  Apparently was seen.  Was awoken  with retrosternal pressure-like pain which persisted for 20-30 minutes and  then EMS was summoned.  The pain started to dissipate on the way to the  hospital and was relieved with O2.  She apparently was not given  nitroglycerin.  She had associated palpitations, however, she had no  associated diaphoresis, nausea or dyspnea.  She was seen in the ER and is  admitted for serial cardiac enzymes, although there were no known risk  factors for coronary artery disease.  The patient is limited in her physical  activity and walking due to spinal stenosis for which she was operated on  several years ago.  There is no history of antecedent exertional angina over  the preceding several weeks.   PAST MEDICAL HISTORY:  Significant for:  1.  Venous insufficiency.  2.  Leg ulcers of the left leg.  3.  Hypertension.  4.  Paroxysmal atrial fibrillation.  5.  GI bleed secondary to peptic ulcer disease.  6.  History of bradycardia.   PAST SURGICAL HISTORY:  Remarkable for:  1.  Vaginal hysterectomy.  2.  Spinal stenosis of lumbosacral spine.  3.  Cataract surgery of left eye one week ago.   CURRENT MEDICATIONS:  1.  Neurontin 100 mg t.i.d.  2.  Clonidine 0.2 mg b.i.d.  3.  __________ 6.5 mg daily.  4.  Capoten 50 mg b.i.d. or t.i.d. I am not sure.   SOCIAL HISTORY:  She lives alone.  Nonsmoker.  She is widowed.   PHYSICAL EXAMINATION:  VITAL SIGNS:  Blood pressure 174/81, pulse 64 and  regular, respiratory rate 16, O2 saturation 100%.  HEENT:  Eyes:  PERRLA.  Extraocular movements intact.  Sclerae clear.  Conjunctivae pink.  NECK:  No JVD.  No carotid bruits.  No thyromegaly.  No carotid bruits.  LUNGS:  Clear to A&P.  No rales, rhonchi or wheezes.  HEART:  Regular rate and rhythm at 106 without gallop murmur.  No S3 or S4  gallops.  No heaves, thrills or rubs.  ABDOMEN:  Soft and nontender.  Bowel sounds normoactive without rebound,  masses or megaly.  EXTREMITIES:  No clubbing or cyanosis.  There was 1+ pedal edema.  The  bandages on the left leg are not unwrapped at present.   IMPRESSION:  1.  Typical/atypical chest pain.  2.  Hypertension suboptimally controlled.  3.  Spinal stenosis with some left leg paresthesias in the last month.  4.  Left leg venous insufficiency ulcers being treated at the wound center.  PLAN:  1.  Admit.  2.  Get serial cardiac enzymes.  3.  Consider Cardiolite scan if clinically indicated.  4.  Lipid profile in a.m.  5.  Make further recommendations as the database expands.      RMD/MEDQ  D:  07/20/2004  T:  07/20/2004  Job:  [id number]

## 2011-03-13 NOTE — Discharge Summary (Signed)
NAMEJEANENNE, LICEA                ACCOUNT NO.:  [id number]   MEDICAL RECORD NO.:  [id number]          PATIENT TYPE:  INP   LOCATION:  3710                         FACILITY:  [facility]   PHYSICIAN:  Maple Mirza, P.A. DATE OF BIRTH:  Mar 20, 1920   DATE OF ADMISSION:  07/22/2004  DATE OF DISCHARGE:  07/23/2004                                 DISCHARGE SUMMARY   DISCHARGE DIAGNOSES:  1.  Admitted with substernal chest pain and sending a palpitation.  2.  Nonobstructive coronary artery disease by left heart catheterization      July 22, 2004, Rollene Rotunda, M.D.  3.  Ruled out for acute myocardial infarction by serial cardiac enzymes.   SECONDARY DIAGNOSES:  1.  History of paroxysmal atrial fibrillation maintaining sinus rhythm this      hospitalization.  2.  Hypertension somewhat uncontrolled.  3.  Chronic ulcers left lower extremity secondary to venous insufficiency.  4.  Bradycardia associated with clonidine.  5.  Family history of myocardial infarction in the father and two brothers      with coronary artery disease.  6.  History of gastrointestinal bleed secondary to peptic ulcer disease.  7.  Status post vaginal hysterectomy, back surgery, and recent cataract      surgery.   PROCEDURE:  Left heart catheterization, left ventriculogram, coronary  angiogram by Dr. Rollene Rotunda on July 22, 2004.  Ejection fraction  65% with normal wall motion.  Left main was free of disease, left anterior  descending had a proximal 40%, mid point 25%.  First and second diagonals  have both proximal 25%, left circumflex in the AV groove 25%, right coronary  artery ostial 25%, proximal 25%, distal 30%.  Finding of nonobstructive  coronary artery disease.  Patient chest pain-free after administration of  oxygen by EMS and has remained chest pain-free this hospitalization.  Remained in sinus rhythm.   The patient will go home post procedure day #1 with right groin, no   complications.   DISCHARGE MEDICATIONS:  1.  Lopressor 50 mg twice daily, new medication.  2.  Clonidine 0.3 mg twice daily, new dose.  3.  Captopril 50 mg three time daily.  4.  Enteric coated aspirin 81 mg daily.  5.  Neurontin 100 mg three times daily.  6.  Protonix 40 mg daily.  7.  Also takes Citrucel at home.  8.  She is to hold her Ziac. This has been placed on hold.   PAIN MANAGEMENT:  Tylenol 325 mg one to two tablets every four to six hours  as needed for pain.   She is asked not to drive for the next two days.   DISCHARGE DIET:  Low sodium, low cholesterol.   She may shower.  She is to call [phone] if she experiences swelling,  increasing pain, redness or drainage from the catheterization site.  She is  to see Jae Dire, Rochel Brome., [facility], [place] office, Thursday,  August 07, 2004, at 1 o'clock in the afternoon.   HISTORY OF PRESENT ILLNESS:  Ms. Michelini is an 77 year old female with history  of  paroxysmal atrial fibrillation and hypertension.  She presented to [facility] with complaints of substernal chest pain.  The patient reports  that the pain awoke her on Saturday night as the worse pain she ever had in  the central part of her chest.  She denies any associated nausea, vomiting,  dyspnea or diaphoresis.  She called her neighbor across the street who is a  Theatre stage manager.  He took her blood pressure.  It was elevated.  She did also  note tachy palpitations at the same time.  EMS was called.  She was started  on oxygen and IV fluids.  The patient had relief of pain even before  arriving at [facility].  At [facility] serial cardiac  enzymes were negative but because of multiple risk factors including  uncontrolled hypertension, strong family history of coronary artery disease,  age and little prior history of cardiac work-up.  The patient was  transferred to [facility]. [facility] for left heart  catheterization.    HOSPITAL COURSE:  The patient arrived at [facility]  with aspirin, Lovenox 50 mg daily, IV fluids and beta-blocker.  The patient  underwent left heart catheterization on July 22, 2004, with the finding  of nonobstructive coronary artery disease, medical therapy recommended by  Dr. Antoine Poche.  The patient goes home post procedure day #1 without  complications with medications and follow-up as dictated.       GM/MEDQ  D:  07/23/2004  T:  07/23/2004  Job:  [id number]   cc:   Vida Roller, M.D.  Fax: [phone]   Jorja Loa, M.D.  [address]  [place]  [place] [address]  Fax: [phone]

## 2011-03-13 NOTE — Op Note (Signed)
NAME:  [name], [name]                          ACCOUNT NO.:  [id number]   MEDICAL RECORD NO.:  [id number]                   PATIENT TYPE:  AMB   LOCATION:  DAY                                  FACILITY:  [facility]   PHYSICIAN:  Lionel December, M.D.                 DATE OF BIRTH:  19-Mar-1920   DATE OF PROCEDURE:  05/04/2003  DATE OF DISCHARGE:                                 OPERATIVE REPORT   PROCEDURE:  Esophagogastroduodenoscopy.   ENDOSCOPIST:  Lionel December, M.D.   INDICATIONS:  [name] is an 77 year old Caucasian female who presented  with epigastric pain and melena about 9 weeks ago. She was found to have  large gastric ulcer in body. H. pylori was positive and she has been  treated. She previously had been on NSAID.  She is now returning to document  complete healing of this ulcer.  The procedure and risks were reviewed with  the patient and informed consent was obtained.   PREOPERATIVE MEDICATIONS:  Cetacaine spray for oropharyngeal topical  anesthesia, Demerol 25 mg IV and Versed 3 mg IV in divided dose.   FINDINGS:  Procedure performed in endoscopy suite.  The patient's vital  signs and O2 saturation were monitored during the procedure and remained  stable.  The patient was placed in the left lateral recumbent position and  Olympus videoscope was passed via the oropharynx without any difficulty into  the esophagus.   ESOPHAGUS:  Mucosa of the esophagus was normal throughout.  Squamocolumnar  junction was unremarkable.   STOMACH:  It was empty and distended very well with insufflation.  The folds  of the proximal stomach were normal.  There was a large scar at the gastric  body along the posterior wall with some erythema, but there was no residual  ulcer.  Antral mucosa was normal.  Pyloric channel was patent.  Angularis  and fundus were examined by retroflexing the scope and were normal.   DUODENUM:  Examination of the bulb and second part of the duodenum was also  normal.   The endoscope was withdrawn.  The patient tolerated the procedure well.   FINAL DIAGNOSIS:  Gastric ulcer has completely healed. There is some focal  erythema along with the scar.   RECOMMENDATIONS:  1. She can go ahead and stop her Prevacid when she finishes this     prescription.  2.     If she has to go back on ASA or other NSAIDs, then she should be on     maintenance therapy with PPI; otherwise she does not need long-term     therapy.  3. The patient will be scheduled for screening colonoscopy which she has     requested.  Lionel December, M.D.    NR/MEDQ  D:  05/04/2003  T:  05/04/2003  Job:  [id number]   cc:   Jorja Loa, M.D.  [address]  [place]  [place] [address]  Fax: [phone]

## 2011-03-13 NOTE — Procedures (Signed)
[name], [name]                ACCOUNT NO.:  [id number]   MEDICAL RECORD NO.:  [id number]          PATIENT TYPE:  OUT   LOCATION:  RESP                          FACILITY:  [facility]   PHYSICIAN:  Edward L. Juanetta Gosling, M.D.DATE OF BIRTH:  1920-04-21   DATE OF PROCEDURE:  06/05/2005  DATE OF DISCHARGE:                              PULMONARY FUNCTION TEST   RESULTS:  1.  Spirometry shows no definite ventilatory defect and no evidence of      airflow obstruction.  2.  Lung volumes show normal total lung capacity and no evidence of      restrictive lung disease.  3.  Diffusion capacity of carbon monoxide (DLCO) is normal.  4.  Arterial blood gases are normal.  Compared to previous tracing of February 19, 2005. There is marked improvement in the FEF 25-75.       ELH/MEDQ  D:  06/05/2005  T:  06/05/2005  Job:  [id number]

## 2011-05-19 ENCOUNTER — Other Ambulatory Visit: Payer: Self-pay

## 2011-05-19 ENCOUNTER — Emergency Department (HOSPITAL_COMMUNITY)
Admission: EM | Admit: 2011-05-19 | Discharge: 2011-05-20 | Disposition: A | Discharge status: Home / Self Care | Payer: Medicare Other | Attending: Emergency Medicine | Admitting: Emergency Medicine

## 2011-05-19 ENCOUNTER — Emergency Department (HOSPITAL_COMMUNITY): Payer: Medicare Other

## 2011-05-19 DIAGNOSIS — S7000XA Contusion of unspecified hip, initial encounter: Secondary | ICD-10-CM | POA: Insufficient documentation

## 2011-05-19 DIAGNOSIS — Y92009 Unspecified place in unspecified non-institutional (private) residence as the place of occurrence of the external cause: Secondary | ICD-10-CM | POA: Insufficient documentation

## 2011-05-19 DIAGNOSIS — I498 Other specified cardiac arrhythmias: Secondary | ICD-10-CM | POA: Insufficient documentation

## 2011-05-19 DIAGNOSIS — W010XXA Fall on same level from slipping, tripping and stumbling without subsequent striking against object, initial encounter: Secondary | ICD-10-CM | POA: Insufficient documentation

## 2011-05-19 HISTORY — DX: Major depressive disorder, single episode, unspecified: F32.9

## 2011-05-19 HISTORY — DX: Depression, unspecified: F32.A

## 2011-05-19 HISTORY — DX: Disorder of thyroid, unspecified: E07.9

## 2011-05-19 HISTORY — DX: Essential (primary) hypertension: I10

## 2011-05-19 LAB — DIFFERENTIAL
Basophils Absolute: 0 10*3/uL (ref 0.0–0.1)
Eosinophils Absolute: 0.1 10*3/uL (ref 0.0–0.7)
Lymphocytes Relative: 18 % (ref 12–46)
Lymphs Abs: 1.1 10*3/uL (ref 0.7–4.0)
Neutrophils Relative %: 72 % (ref 43–77)

## 2011-05-19 LAB — COMPREHENSIVE METABOLIC PANEL
ALT: 21 U/L (ref 0–35)
AST: 36 U/L (ref 0–37)
Alkaline Phosphatase: 82 U/L (ref 39–117)
CO2: 29 mEq/L (ref 19–32)
GFR calc Af Amer: 34 mL/min — ABNORMAL LOW (ref >60)
Glucose, Bld: 98 mg/dL (ref 70–99)
Potassium: 3.5 mEq/L (ref 3.5–5.1)
Sodium: 139 mEq/L (ref 135–145)
Total Protein: 7.1 g/dL (ref 6.0–8.3)

## 2011-05-19 LAB — URINALYSIS, ROUTINE W REFLEX MICROSCOPIC
Bilirubin Urine: NEGATIVE
Glucose, UA: NEGATIVE mg/dL
Hgb urine dipstick: NEGATIVE
Ketones, ur: NEGATIVE mg/dL
Nitrite: NEGATIVE
pH: 7.5 (ref 5.0–8.0)

## 2011-05-19 LAB — TROPONIN I: Troponin I: <0.3 ng/mL (ref <0.30)

## 2011-05-19 LAB — CBC
Platelets: 196 10*3/uL (ref 150–400)
RBC: 3.39 MIL/uL — ABNORMAL LOW (ref 3.87–5.11)
WBC: 6.2 10*3/uL (ref 4.0–10.5)

## 2011-05-19 MED ORDER — SODIUM CHLORIDE 0.9 % IV SOLN
999.0000 mL | INTRAVENOUS | Status: DC
  Administered 2011-05-19: 999 mL via INTRAVENOUS

## 2011-05-19 NOTE — ED Notes (Signed)
eval by edp

## 2011-05-19 NOTE — ED Provider Notes (Signed)
History     Chief Complaint  Patient presents with  . Fall   Patient is a 77 y.o. female presenting with fall. The history is provided by the patient and a friend. No language interpreter was used.  Fall The accident occurred 1 to 2 hours ago. Incident: while at home. She fell from an unknown height. She landed on a hard floor. There was no blood loss. Point of impact: unknown. Pain location: left hip. The pain is mild. She was not ambulatory at the scene. Pertinent negatives include no fever, no abdominal pain, no headaches and no loss of consciousness. The symptoms are aggravated by standing. She has tried nothing for the symptoms.  BIB EMS after patient called using [facility] activation. Patient c/o fall which occurred 2 hours ago in which patient landed on her "backside" and now c/o mild left hip pain. Patient states she was unable to move after the fall and used her [facility] call button to obtain help. Patient notes increased frequency of falls recently with an additional fall reported which occurred yesterday. Per patient's neighbor, patient fell while she was cleaning the house yesterday and today while she was cutting up onions.  Neighbor notes she has fallen 3x in the last 2 weeks. Neighbor reports patient lives alone but the neighbor and neighbor's spouse regularly check on patient. Patient denies chest pain, abdominal pain, syncope.  PCP- Sudie Bailey  Patient seen at 6:16 PM  No past medical history on file.  No past surgical history on file.  No family history on file.  History  Substance Use Topics  . Smoking status: Not on file  . Smokeless tobacco: Not on file  . Alcohol Use: Not on file    OB History    Grav Para Term Preterm Abortions TAB SAB Ect Mult Living                  Review of Systems  Constitutional: Negative for fever.  Respiratory: Negative for shortness of breath.   Cardiovascular: Negative for chest pain.  Gastrointestinal: Negative for abdominal pain.   Musculoskeletal:       Left hip pain  Neurological: Negative for loss of consciousness, syncope and headaches.  All other systems reviewed and are negative.  All other systems negative except as noted in HPI.    Physical Exam  BP 130/41  Pulse 56  Resp 19  Ht 5' (1.524 m)  Wt 89 lb (40.37 kg)  BMI 17.38 kg/m2  SpO2 92%  Physical Exam  Nursing note and vitals reviewed. Constitutional: She is oriented to person, place, and time. She appears well-developed and well-nourished. No distress.       Bradycardic.   HENT:  Head: Normocephalic and atraumatic.  Eyes: Conjunctivae and EOM are normal. Pupils are equal, round, and reactive to light. No scleral icterus.  Neck: Normal range of motion and phonation normal. Neck supple.  Cardiovascular: Normal rate, regular rhythm and intact distal pulses.   No murmur heard. Pulmonary/Chest: Effort normal and breath sounds normal. She exhibits no tenderness.  Abdominal: Soft. Bowel sounds are normal. She exhibits no distension. There is no tenderness. There is no guarding.       Mild tenderness over anterior pelvis.   Musculoskeletal: Normal range of motion. She exhibits no edema.       Left lumbar tenderness but entire spine non-tender otherwise. Pain with ROM of right hip. Tender to palpation of left hip.  Neurological: She is alert and oriented to person, place,  and time. She has normal strength. She exhibits normal muscle tone.  Skin: Skin is warm and dry.  Psychiatric: She has a normal mood and affect. Her behavior is normal. Judgment and thought content normal.    ED Course  Procedures  Date: 05/19/2011  Rate: 57   Rhythm: sinus bradycardia  QRS Axis: normal  Intervals: normal  ST/T Wave abnormalities: nonspecific ST changes and nonspecific T wave changes  Conduction Disutrbances:none  Narrative Interpretation:   Old EKG Reviewed: unchanged Pt ambulated without assistance. Nagi Furio L11:34 PM MDM Recurrent falls without fx  and negative for evauation of provocation in ED. Doubt SBI, metabolic instability, UTI or occult infection.  8:34 PM-Patient past medical records reviewed by ED physician.   Results for orders placed during the hospital encounter of 05/19/11  CBC      Component Value Range   WBC 6.2  4.0 - 10.5 (K/uL)   RBC 3.39 (*) 3.87 - 5.11 (MIL/uL)   Hemoglobin 10.7 (*) 12.0 - 15.0 (g/dL)   HCT 16.1 (*) 09.6 - 46.0 (%)   MCV 96.5  78.0 - 100.0 (fL)   MCH 31.6  26.0 - 34.0 (pg)   MCHC 32.7  30.0 - 36.0 (g/dL)   RDW 04.5  40.9 - 81.1 (%)   Platelets 196  150 - 400 (K/uL)  DIFFERENTIAL      Component Value Range   Neutrophils Relative 72  43 - 77 (%)   Neutro Abs 4.4  1.7 - 7.7 (K/uL)   Lymphocytes Relative 18  12 - 46 (%)   Lymphs Abs 1.1  0.7 - 4.0 (K/uL)   Monocytes Relative 9  3 - 12 (%)   Monocytes Absolute 0.5  0.1 - 1.0 (K/uL)   Eosinophils Relative 2  0 - 5 (%)   Eosinophils Absolute 0.1  0.0 - 0.7 (K/uL)   Basophils Relative 0  0 - 1 (%)   Basophils Absolute 0.0  0.0 - 0.1 (K/uL)  COMPREHENSIVE METABOLIC PANEL      Component Value Range   Sodium 139  135 - 145 (mEq/L)   Potassium 3.5  3.5 - 5.1 (mEq/L)   Chloride 97  96 - 112 (mEq/L)   CO2 29  19 - 32 (mEq/L)   Glucose, Bld 98  70 - 99 (mg/dL)   BUN 35 (*) 6 - 23 (mg/dL)   Creatinine, Ser 9.14 (*) 0.50 - 1.10 (mg/dL)   Calcium 78.2 (*) 8.4 - 10.5 (mg/dL)   Total Protein 7.1  6.0 - 8.3 (g/dL)   Albumin 3.6  3.5 - 5.2 (g/dL)   AST 36  0 - 37 (U/L)   ALT 21  0 - 35 (U/L)   Alkaline Phosphatase 82  39 - 117 (U/L)   Total Bilirubin 0.3  0.3 - 1.2 (mg/dL)   GFR calc non Af Amer 28 (*) >60 (mL/min)   GFR calc Af Amer 34 (*) >60 (mL/min)  URINALYSIS, ROUTINE W REFLEX MICROSCOPIC      Component Value Range   Color, Urine YELLOW  YELLOW    Appearance CLEAR  CLEAR    Specific Gravity, Urine 1.015  1.005 - 1.030    pH 7.5  5.0 - 8.0    Glucose, UA NEGATIVE  NEGATIVE (mg/dL)   Hgb urine dipstick NEGATIVE  NEGATIVE    Bilirubin  Urine NEGATIVE  NEGATIVE    Ketones, ur NEGATIVE  NEGATIVE (mg/dL)   Protein, ur NEGATIVE  NEGATIVE (mg/dL)   Urobilinogen, UA 0.2  0.0 -  1.0 (mg/dL)   Nitrite NEGATIVE  NEGATIVE    Leukocytes, UA NEGATIVE  NEGATIVE   TROPONIN I      Component Value Range   Troponin I <0.30  <0.30 (ng/mL)   Dg Hip Bilateral W/pelvis  05/19/2011  *RADIOLOGY REPORT*  Clinical Data: Pain post fall  BILATERAL HIP WITH PELVIS - 4+ VIEW  Comparison: 10/17/2010  Findings: Degenerative changes in the visualized lower lumbar spine.  Iliofemoral arterial calcifications.  Old bilateral pubic ramus fractures.  Stable changes of a sliding screw and IM rod fixation across the right femoral neck.  Negative for fracture, dislocation, or other acute bony abnormality.  IMPRESSION:  1.  Negative for fracture or other acute bony abnormality. 2.  Postoperative and degenerative changes as above.  Original Report Authenticated By: Osa Craver, M.D.    Chart written by Clarita Crane acting as scribe for Flint Melter, MD  I personally performed the services described in this documentation, which was scribed in my presence. The recorded information has been reviewed and considered. Flint Melter, MD    Flint Melter, MD 05/19/11 [phone]

## 2011-05-19 NOTE — ED Notes (Signed)
Pt states she fell last night and today. States she has landed on her butt both times

## 2011-05-19 NOTE — ED Notes (Signed)
Pt cont to wait for eval by edp. Friend at bedside

## 2011-05-21 LAB — URINE CULTURE
Colony Count: NO GROWTH
Culture  Setup Time: 201207250401
Culture: NO GROWTH

## 2011-05-26 ENCOUNTER — Encounter: Payer: Self-pay | Admitting: Family Medicine

## 2011-05-26 ENCOUNTER — Ambulatory Visit (INDEPENDENT_AMBULATORY_CARE_PROVIDER_SITE_OTHER): Payer: Medicare Other | Admitting: Family Medicine

## 2011-05-26 VITALS — BP 130/50 | HR 66 | Ht <= 58 in | Wt 91.0 lb

## 2011-05-26 DIAGNOSIS — E039 Hypothyroidism, unspecified: Secondary | ICD-10-CM

## 2011-05-26 DIAGNOSIS — L089 Local infection of the skin and subcutaneous tissue, unspecified: Secondary | ICD-10-CM

## 2011-05-26 DIAGNOSIS — T148XXA Other injury of unspecified body region, initial encounter: Secondary | ICD-10-CM

## 2011-05-26 DIAGNOSIS — R296 Repeated falls: Secondary | ICD-10-CM

## 2011-05-26 DIAGNOSIS — R29818 Other symptoms and signs involving the nervous system: Secondary | ICD-10-CM

## 2011-05-26 MED ORDER — DOXYCYCLINE HYCLATE 100 MG PO TABS: 100.0000 mg | ORAL_TABLET | Freq: Two times a day (BID) | ORAL | Status: AC

## 2011-05-26 MED ORDER — FUROSEMIDE 20 MG PO TABS: 20.0000 mg | ORAL_TABLET | Freq: Every day | ORAL | Status: DC

## 2011-05-26 NOTE — Progress Notes (Signed)
  Subjective:    Patient ID: [name], female    DOB: January 30, 1920, 77 y.o.   MRN: [id number]  HPI  patient presents to establish medical care. EMR was reviewed-patient has extensive medical history, medications were also reviewed Patient's major concern was that open draining lesion on the back of her leg She has had multiple falls over the past few months- often slipping in home ( denies throw rugs). She was recently seen in the emergency room status post a fall here at that time she had imaging of her hips and back done which did not reveal any new fracture. She was told to stop her old tram and to try to increase her we do hope of her falls. Since then she started drinking and short to help with her weight. He should also has a home health nurse who comes to the home After a visit from the home health nurse she noticed a draining lesion on the back of her right calf because of the position she is been unable to visualize this. She believes the lesion came up after trying to put on a compression hose on the right leg She states she has very thin skin and any injuries causing significant tears however she has never had a draining lesion like this before in her leg. He does note she has a chronic ulcer which is being treated by home health on her left ankle. She visits wound care typically once a week Patient admits to pain in the back of the right calf drainage with mild odor noticed on her pillow she lies between the legs. She also noted drainage on the floor as she walks. She states this is mostly a pink or orange color.   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 Positive fatigue     Objective:     Physical Exam GEN- NAD, alert and oriented HEENT- PERRL, Arcus senilis, EOMI, strabismus of left eye, MMM Neck-supple, no LAD CVS- RRR, no murmur RESP- CTAB EXT- large area of ecchymosis and brusing on Right calf, extending 2 cm outward is erythema- multiple skin tears, tear at inferior end of area draining serosangouness fluid, TTP, +warmth to lesion, 2+ edema of RLE, 1+ edema LLE   1cm ulceration on left lateral malleous Pulses 1+ in DP - Gait Ambulates cautiosly with walker Skin- bruising on Left hip and over left paraspinal muscles- yellow/brown hue    Assessment & Plan:

## 2011-05-26 NOTE — Patient Instructions (Addendum)
For your leg, keep area clean and dry Start the doxycycline 100mg  by mouth twice a day Try to call and reschedule your wound care appt   Elevate your leg with a pillow If you have fever, chills or see pus please let us know Get your thryoid lab done within the the next few months I will get records from Dr. Sudie Bailey Hold off on physical therapy  Next visit in 3 weeks.

## 2011-05-27 ENCOUNTER — Encounter: Payer: Self-pay | Admitting: Family Medicine

## 2011-05-27 DIAGNOSIS — R296 Repeated falls: Secondary | ICD-10-CM | POA: Insufficient documentation

## 2011-05-27 NOTE — Assessment & Plan Note (Signed)
I am concerned for a post rheumatic wound infection. The area in question on not indurated as many open areas which are draining a serous sanguinous fluid is well as blood. My concern especially with the location and the current erythema and warmth surrounding that infection may set in. I will start her on doxycycline 100 mg twice a day. I was able to drain most of the blood towards the bottom of the wound during the visit and sterile bandages were applied. Patient will have home health come out tomorrow for bandage change. He will also call her wound care Doctor [facility] she can be seen this week. She was given red flags if the wound worsens

## 2011-05-27 NOTE — Assessment & Plan Note (Signed)
Patient is unsure if she had hypothyroidism diagnosed before starting amiodarone. She states she has not had her thyroid checked in some time. I will need local records to see why she was started on the amiodarone. Will check thyroid studies

## 2011-05-27 NOTE — Assessment & Plan Note (Addendum)
Patient has had recurrent falls. She has not suffered any recent fractures. 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 She states occasionally her balance gets off and she falls. Of note she does have a history of peripheral neuropathy which is also likely contributing to her imbalance. She currently is using a rolling walker which has a seat attached.  We will need to discuss other options for her safety examples include assisted living facility

## 2011-05-28 ENCOUNTER — Telehealth: Payer: Self-pay | Admitting: Family Medicine

## 2011-05-28 NOTE — Telephone Encounter (Signed)
LVM for pt to  return call.  Her thyroid studies will were normal she can continue her current dose of Synthroid. I wanted to know if she had seen her wound care Dr. regarding her leg and how she was feeling.

## 2011-05-28 NOTE — Telephone Encounter (Signed)
Patient states she has seen wound care Dr and states she is feeling okay.

## 2011-06-16 ENCOUNTER — Encounter: Payer: Self-pay | Admitting: Family Medicine

## 2011-06-16 ENCOUNTER — Ambulatory Visit (INDEPENDENT_AMBULATORY_CARE_PROVIDER_SITE_OTHER): Payer: Medicare Other | Admitting: Family Medicine

## 2011-06-16 VITALS — BP 176/60 | HR 62 | Ht <= 58 in | Wt 93.0 lb

## 2011-06-16 DIAGNOSIS — I4891 Unspecified atrial fibrillation: Secondary | ICD-10-CM

## 2011-06-16 DIAGNOSIS — R296 Repeated falls: Secondary | ICD-10-CM

## 2011-06-16 DIAGNOSIS — L089 Local infection of the skin and subcutaneous tissue, unspecified: Secondary | ICD-10-CM

## 2011-06-16 DIAGNOSIS — T148XXA Other injury of unspecified body region, initial encounter: Secondary | ICD-10-CM

## 2011-06-16 DIAGNOSIS — D649 Anemia, unspecified: Secondary | ICD-10-CM

## 2011-06-16 DIAGNOSIS — R29818 Other symptoms and signs involving the nervous system: Secondary | ICD-10-CM

## 2011-06-16 DIAGNOSIS — E039 Hypothyroidism, unspecified: Secondary | ICD-10-CM

## 2011-06-16 NOTE — Patient Instructions (Signed)
Stop taking the Iron tablets Stop taking the Calcitriol  Continue the Multivitamin  Continue instructions per wound care If your nurse notices your blood pressure is staying up high then give me a call Try to get in ensure twice a day  Continue your thyroid medication  Next visit 1st visit of November

## 2011-06-16 NOTE — Assessment & Plan Note (Signed)
She is history of chronic anemia. Her last iron studies for approximately 2 years ago. She is currently taking a multivitamin with iron in it. At this time I would discontinue her iron tablets

## 2011-06-16 NOTE — Progress Notes (Signed)
  Subjective:    Patient ID: [name], female    DOB: January 19, 1920, 77 y.o.   MRN: [id number]  HPI The patient here to followup she was seen 3 weeks ago for a wound to her right calf, she also had thyroid studies done Wound Care-[facility]- 3x a week-  Calf wound- patient was seen by her wound doctor 2 weeks ago. She had a debridement of the wound on her right calf. She had this done again last week. The wound care nurse has been caring for both wounds the one on her calf and the one on her left ankle which is chronic. She will followup with wound care tomorrow to have the bandage is removed.  Thyroid studies- patient has history of hypothyroidism. She's been on amiodarone for at least the past 4 years. She was started on the secondary to atrial fibrillation. She has a low heart rates therefore I assume she was unable to tolerate a rate lowering medication. Her thyroid studies were within normal limits. She denies any chest pain or shortness of breath to suggest fibrosis  Polypharmacy- her medications were reviewed. She would like to have some medications removed if possible. Of note she was only taking her clonidine at night for the past few years  Recurrent falls - we discussed assisted-living facility. Patient does not want to do this. She has already looked into getting an Aide during the day.Marland Kitchen She will have evaluation for her ongoing PT this week    Review of Systems  Per above     Objective:   Physical Exam GEN- NAD, alert and oriented HEENT- strabismus of left eye, and CVS- RRR, no murmur RESP- CTAB Ext- RLE wrapped in bandage up entire calf, left covered with ted hose  Edema -1+ pedal edema much improved bilat       Assessment & Plan:

## 2011-06-16 NOTE — Assessment & Plan Note (Signed)
Recent calcium levels were elevated at 12.1. I will stop her calcitriol at this time. We will recheck this in the near future

## 2011-06-16 NOTE — Assessment & Plan Note (Signed)
Patient will continue to follow with wound care.

## 2011-06-16 NOTE — Assessment & Plan Note (Signed)
Continue current medicine ?

## 2011-06-16 NOTE — Assessment & Plan Note (Signed)
She has not had any problems with atrial fibrillation. Her thyroid studies are normal. At this time we will continue her amiodarone. I am reluctant to switch her just to her rate controlling medication secondary to her fluctuant blood pressure her, recurrent falls and her heart rate

## 2011-06-16 NOTE — Assessment & Plan Note (Signed)
She will have another PT evaluation. I discussed with her she has paperwork that is needed for her nurse Aide she can send it to our office. She would benefit from an assisted living facility however she is reluctant to do so at this time

## 2011-07-20 LAB — CBC
HCT: 30.2 — ABNORMAL LOW
HCT: 30.5 — ABNORMAL LOW
HCT: 32.6 — ABNORMAL LOW
HCT: 35.3 — ABNORMAL LOW
Hemoglobin: 10.4 — ABNORMAL LOW
Hemoglobin: 10.5 — ABNORMAL LOW
Hemoglobin: 12
MCHC: 34.4
MCHC: 34.5
MCHC: 34.6
MCHC: 35
MCHC: 35
MCHC: 35.1
MCV: 94.5
MCV: 94.8
MCV: 95
MCV: 95
MCV: 95.9
MCV: 96.4
Platelets: 110 — ABNORMAL LOW
Platelets: 194
Platelets: 196
Platelets: 201
Platelets: 338
RBC: 3.06 — ABNORMAL LOW
RBC: 3.19 — ABNORMAL LOW
RDW: 13.2
RDW: 13.3
RDW: 13.6
RDW: 14.1
RDW: 14.2
RDW: 15.1
WBC: 5.7
WBC: 6.1
WBC: 6.4

## 2011-07-20 LAB — BASIC METABOLIC PANEL
BUN: 19
BUN: 21
BUN: 22
BUN: 25 — ABNORMAL HIGH
BUN: 27 — ABNORMAL HIGH
BUN: 28 — ABNORMAL HIGH
CO2: 26
CO2: 29
CO2: 29
CO2: 30
CO2: 30
CO2: 31
Calcium: 8 — ABNORMAL LOW
Calcium: 8.3 — ABNORMAL LOW
Calcium: 8.4
Chloride: 101
Chloride: 102
Chloride: 104
Chloride: 104
Chloride: 98
Chloride: 98
Creatinine, Ser: 1.21 — ABNORMAL HIGH
Creatinine, Ser: 1.22 — ABNORMAL HIGH
Creatinine, Ser: 1.3 — ABNORMAL HIGH
Creatinine, Ser: 1.45 — ABNORMAL HIGH
GFR calc Af Amer: 47 — ABNORMAL LOW
GFR calc Af Amer: 53 — ABNORMAL LOW
GFR calc non Af Amer: 34 — ABNORMAL LOW
Glucose, Bld: 106 — ABNORMAL HIGH
Glucose, Bld: 107 — ABNORMAL HIGH
Glucose, Bld: 112 — ABNORMAL HIGH
Glucose, Bld: 117 — ABNORMAL HIGH
Glucose, Bld: 93
Potassium: 4.2
Potassium: 4.5
Potassium: 4.8
Potassium: 4.8
Sodium: 135
Sodium: 138
Sodium: 138
Sodium: 139

## 2011-07-20 LAB — URINALYSIS, ROUTINE W REFLEX MICROSCOPIC
Bilirubin Urine: NEGATIVE
Glucose, UA: NEGATIVE
Hgb urine dipstick: NEGATIVE
Ketones, ur: NEGATIVE
Specific Gravity, Urine: 1.02
pH: 5.5

## 2011-07-20 LAB — CROSSMATCH: Antibody Screen: NEGATIVE

## 2011-07-20 LAB — DIFFERENTIAL
Basophils Absolute: 0
Basophils Absolute: 0
Basophils Absolute: 0
Basophils Absolute: 0
Basophils Relative: 0
Basophils Relative: 0
Basophils Relative: 0
Eosinophils Absolute: 0
Eosinophils Absolute: 0.1
Eosinophils Absolute: 0.1
Eosinophils Absolute: 0.2
Eosinophils Relative: 1
Eosinophils Relative: 1
Eosinophils Relative: 2
Eosinophils Relative: 3
Lymphocytes Relative: 13
Lymphocytes Relative: 15
Lymphocytes Relative: 19
Lymphs Abs: 0.8
Lymphs Abs: 0.9
Lymphs Abs: 0.9
Lymphs Abs: 1.2
Monocytes Absolute: 0.5
Monocytes Absolute: 0.5
Monocytes Absolute: 0.6
Monocytes Absolute: 0.6
Monocytes Absolute: 0.7
Monocytes Relative: 11
Monocytes Relative: 7
Monocytes Relative: 9
Neutro Abs: 4.7
Neutro Abs: 4.7
Neutro Abs: 7.2
Neutrophils Relative %: 74
Neutrophils Relative %: 74
Neutrophils Relative %: 81 — ABNORMAL HIGH

## 2011-07-20 LAB — COMPREHENSIVE METABOLIC PANEL
AST: 65 — ABNORMAL HIGH
Albumin: 2.5 — ABNORMAL LOW
Calcium: 8.6
Creatinine, Ser: 1.49 — ABNORMAL HIGH
GFR calc Af Amer: 40 — ABNORMAL LOW
Total Protein: 5.8 — ABNORMAL LOW

## 2011-07-20 LAB — BLOOD GAS, ARTERIAL
Acid-Base Excess: 3 — ABNORMAL HIGH
FIO2: 0.21
O2 Saturation: 83.3
Patient temperature: 37
TCO2: 24

## 2011-07-20 LAB — PROTIME-INR: Prothrombin Time: 12.4

## 2011-07-21 LAB — BASIC METABOLIC PANEL
Calcium: 9
Chloride: 101
Creatinine, Ser: 1.12
GFR calc non Af Amer: 46 — ABNORMAL LOW
Glucose, Bld: 101 — ABNORMAL HIGH
Potassium: 4.7
Sodium: 139

## 2011-07-21 LAB — CBC
MCHC: 33.8
RBC: 3.77 — ABNORMAL LOW

## 2011-08-31 ENCOUNTER — Ambulatory Visit: Payer: PRIVATE HEALTH INSURANCE | Admitting: Family Medicine

## 2011-08-31 ENCOUNTER — Ambulatory Visit (INDEPENDENT_AMBULATORY_CARE_PROVIDER_SITE_OTHER): Payer: Medicare Other | Admitting: Family Medicine

## 2011-08-31 ENCOUNTER — Encounter: Payer: Self-pay | Admitting: Family Medicine

## 2011-08-31 VITALS — BP 130/60 | HR 60 | Resp 16 | Ht <= 58 in | Wt 97.0 lb

## 2011-08-31 DIAGNOSIS — E785 Hyperlipidemia, unspecified: Secondary | ICD-10-CM

## 2011-08-31 DIAGNOSIS — R296 Repeated falls: Secondary | ICD-10-CM

## 2011-08-31 DIAGNOSIS — Z23 Encounter for immunization: Secondary | ICD-10-CM

## 2011-08-31 DIAGNOSIS — E039 Hypothyroidism, unspecified: Secondary | ICD-10-CM

## 2011-08-31 DIAGNOSIS — D649 Anemia, unspecified: Secondary | ICD-10-CM

## 2011-08-31 DIAGNOSIS — I10 Essential (primary) hypertension: Secondary | ICD-10-CM

## 2011-08-31 DIAGNOSIS — R29818 Other symptoms and signs involving the nervous system: Secondary | ICD-10-CM

## 2011-08-31 MED ORDER — VENLAFAXINE HCL 37.5 MG PO TABS: 37.5000 mg | ORAL_TABLET | Freq: Every day | ORAL | Status: DC

## 2011-08-31 MED ORDER — LEVOTHYROXINE SODIUM 75 MCG PO TABS: 75.0000 ug | ORAL_TABLET | Freq: Every day | ORAL | Status: DC

## 2011-08-31 NOTE — Patient Instructions (Signed)
Please get your blood work done, it is best if done fasting  We will call with results of blood work Today you received your flu shot I will call about your walker being replaced Careful in your home to avoid falls I will see you in Feb as scheduled

## 2011-08-31 NOTE — Assessment & Plan Note (Signed)
Blood pressure much improved today. No change the patient's medication. Of note she takes clonidine once a day.

## 2011-08-31 NOTE — Assessment & Plan Note (Signed)
Iron tablets stopped at last visit. Will obtain CBC to monitor hemoglobin. Patient is trying to wean off medications that are not absolutely necessary at her age.

## 2011-08-31 NOTE — Assessment & Plan Note (Signed)
Patient request lipid panel as she's not taking any current statin. Will obtain

## 2011-08-31 NOTE — Progress Notes (Signed)
  Subjective:    Patient ID: [name], female    DOB: 03/27/1920, 76 y.o.   MRN: [id number]  HPI - No specific concerns  [facility]- [phone] need an order for rolling walker- 2 front wheels, and tray     Wound care- continues to follow for slow healing left ankle  Hypothyrodism- reviewed last set of labs, taking synthroid  Hypertension- tolerating blood pressure medications. Denies any chest pain, shortness of breath, occasional leg edema taking Lasix. Needs labs  Hyperlipidemia- patient requests fasting lipid panel  Recurrent fall- has been using her walker is at home. She's not had any falls since her last visit in August. She continues to have help by her friends. They're providing meals for her as well as checking on her. She did have a home health aide for a few weeks however she said this did not work out and she preferred not to have any help at home.  Needs Flu shot  Review of Systems -per above         GEN- + fatigue,denies  fever, weight loss,weakness, recent illness CVS- denies chest pain, palpitations RESP- denies SOB, cough, wheeze ABD- denies N/V, change in stools, abd pain MSK- + joint pain, muscle aches, injury Neuro- denies headache, dizziness, syncope, seizure activity  Appetite better- weight up 4lbs    Objective:   Physical Exam  GEN- NAD, alert and oriented HEENT-  Arcus senilis, EOMI, strabismus of left eye, MMM CVS- RRR, no murmur RESP- CTAB ABS-NABS, NT, ND EXT- 1+ edema  Pulses 1+ in DP       Assessment & Plan:

## 2011-08-31 NOTE — Assessment & Plan Note (Signed)
No change the Synthroid dose previous thyroid studies in July within normal limits.

## 2011-08-31 NOTE — Assessment & Plan Note (Signed)
No falls recently. Patient has finished physical therapy. She does not want to try out another home health aide. I continue to advise I think that she should be an assisted living facility. She declines at this time.

## 2011-08-31 NOTE — Assessment & Plan Note (Signed)
Recheck calcium levels. Patient taken off of calcitriol. She is currently taking 600 mg of calcium daily.

## 2011-09-01 LAB — CBC
MCH: 31.8 pg (ref 26.0–34.0)
MCHC: 32.4 g/dL (ref 30.0–36.0)
Platelets: 254 10*3/uL (ref 150–400)
RBC: 4 MIL/uL (ref 3.87–5.11)

## 2011-09-01 LAB — BASIC METABOLIC PANEL
BUN: 32 mg/dL — ABNORMAL HIGH (ref 6–23)
CO2: 30 mEq/L (ref 19–32)
Chloride: 100 mEq/L (ref 96–112)
Creat: 1.39 mg/dL — ABNORMAL HIGH (ref 0.50–1.10)

## 2011-09-01 LAB — LIPID PANEL
Cholesterol: 234 mg/dL — ABNORMAL HIGH (ref 0–200)
HDL: 73 mg/dL (ref >39)
Triglycerides: 136 mg/dL (ref <150)
VLDL: 27 mg/dL (ref 0–40)

## 2011-10-26 ENCOUNTER — Other Ambulatory Visit: Payer: Self-pay | Admitting: Family Medicine

## 2011-10-26 ENCOUNTER — Other Ambulatory Visit: Payer: Self-pay

## 2011-10-26 MED ORDER — FELODIPINE ER 10 MG PO TB24: 10.0000 mg | ORAL_TABLET | Freq: Every day | ORAL | Status: DC

## 2011-10-29 ENCOUNTER — Other Ambulatory Visit: Payer: Self-pay | Admitting: Family Medicine

## 2011-11-05 ENCOUNTER — Other Ambulatory Visit: Payer: Self-pay | Admitting: Family Medicine

## 2011-11-05 MED ORDER — FELODIPINE ER 10 MG PO TB24: 10.0000 mg | ORAL_TABLET | Freq: Every day | ORAL | Status: DC

## 2011-11-05 MED ORDER — AMIODARONE HCL 200 MG PO TABS: 100.0000 mg | ORAL_TABLET | Freq: Every day | ORAL | Status: DC

## 2011-11-24 ENCOUNTER — Telehealth: Payer: Self-pay | Admitting: Family Medicine

## 2011-11-24 NOTE — Telephone Encounter (Signed)
Spoke with pt and notified her that there are no orders for lab work before her appointment on 2/1

## 2011-11-27 ENCOUNTER — Ambulatory Visit (INDEPENDENT_AMBULATORY_CARE_PROVIDER_SITE_OTHER): Payer: Medicare Other | Admitting: Family Medicine

## 2011-11-27 ENCOUNTER — Encounter: Payer: Self-pay | Admitting: Family Medicine

## 2011-11-27 VITALS — BP 138/60 | HR 66 | Resp 18 | Ht <= 58 in | Wt 103.0 lb

## 2011-11-27 DIAGNOSIS — I4891 Unspecified atrial fibrillation: Secondary | ICD-10-CM

## 2011-11-27 DIAGNOSIS — N390 Urinary tract infection, site not specified: Secondary | ICD-10-CM

## 2011-11-27 DIAGNOSIS — G629 Polyneuropathy, unspecified: Secondary | ICD-10-CM

## 2011-11-27 DIAGNOSIS — R8290 Unspecified abnormal findings in urine: Secondary | ICD-10-CM

## 2011-11-27 DIAGNOSIS — I872 Venous insufficiency (chronic) (peripheral): Secondary | ICD-10-CM

## 2011-11-27 DIAGNOSIS — I2510 Atherosclerotic heart disease of native coronary artery without angina pectoris: Secondary | ICD-10-CM

## 2011-11-27 DIAGNOSIS — R82998 Other abnormal findings in urine: Secondary | ICD-10-CM

## 2011-11-27 DIAGNOSIS — F341 Dysthymic disorder: Secondary | ICD-10-CM

## 2011-11-27 DIAGNOSIS — I10 Essential (primary) hypertension: Secondary | ICD-10-CM

## 2011-11-27 DIAGNOSIS — G609 Hereditary and idiopathic neuropathy, unspecified: Secondary | ICD-10-CM

## 2011-11-27 LAB — POCT URINALYSIS DIPSTICK
Leukocytes, UA: NEGATIVE
Nitrite, UA: NEGATIVE
Protein, UA: NEGATIVE
Urobilinogen, UA: 0.2
pH, UA: 6

## 2011-11-27 MED ORDER — GABAPENTIN 300 MG PO CAPS: 300.0000 mg | ORAL_CAPSULE | Freq: Every day | ORAL | Status: DC

## 2011-11-27 NOTE — Patient Instructions (Addendum)
Continue your current medications Take gabapentin 1 tablet in the morning and 2 in the evening for your feet I will call you about the amiodarone  F/U in 3 months

## 2011-11-27 NOTE — Progress Notes (Signed)
  Subjective:    Patient ID: [name], female    DOB: [birth year], 78 y.o.   MRN: [id number]  HPI Pt here for routine follow-up Medications reviewed  HTN-no problems with blood pressure medications  Leg spasms and tingling- currently on gabapentin, takes 300mg  BID, the night time dose is not help  Hypothyroidism reviewed last set of labs, synthroid dose unchanged  OA- taking ultram Depression- mood stable on Effexor, she visits with friends often  Chronic ulcer- continues to f/u with wound care  A fib- on amiodarone, no recent palpitations  Review of Systems   GEN-  fatigue,denies  fever, weight loss,weakness, recent illness CVS- denies chest pain, palpitations, +leg swelling RESP- denies SOB, cough, wheeze ABD- denies N/V, change in stools, abd pain GU- denies dysuria- dark, occ foul smelling urine, no pelvic pain or discharge, no vaginal bleeding MSK- + joint pain, muscle aches, injury Neuro- denies headache, dizziness, syncope, seizure activity, no recent falls     Objective:   Physical Exam GEN- NAD, alert and oriented x 3 HEENT- strabismus of left eye, MMM CVS- RRR, no murmur RESP- CTAB ABD-NABS, soft, NT ND Ext- RLE with tegaderm over skin tears , left covered with ted hose wrapping and boot over crhonic ulcer  Edema -1+ pedal edema much improved bilat Psych- not depressed or anxious appearing      Assessment & Plan:

## 2011-11-29 ENCOUNTER — Encounter: Payer: Self-pay | Admitting: Family Medicine

## 2011-11-29 DIAGNOSIS — R8290 Unspecified abnormal findings in urine: Secondary | ICD-10-CM | POA: Insufficient documentation

## 2011-11-29 DIAGNOSIS — G629 Polyneuropathy, unspecified: Secondary | ICD-10-CM | POA: Insufficient documentation

## 2011-11-29 NOTE — Assessment & Plan Note (Signed)
Stable on effexor.   

## 2011-11-29 NOTE — Assessment & Plan Note (Signed)
Pt on gabapentin, increase bedtime dose to 600mg . I prefer not to add TCA with high index on Beer's criteria and pt on effexor currently and ultram. I think she will have too many potentially sedating meds

## 2011-11-29 NOTE — Assessment & Plan Note (Signed)
No sign of infection.UA neg, will obtain culture

## 2011-11-29 NOTE — Assessment & Plan Note (Signed)
At baseline, elevation of legs encouraged, ted hose caused bruising and tearing of skin

## 2011-11-29 NOTE — Assessment & Plan Note (Signed)
No episodes of palpitations. On amiodarone

## 2011-11-29 NOTE — Assessment & Plan Note (Signed)
BP at goal 

## 2011-11-29 NOTE — Assessment & Plan Note (Signed)
No recent chest pain, history of CAD, FLP done at pt request last visit, treating at this time not indicated

## 2011-11-30 ENCOUNTER — Telehealth: Payer: Self-pay | Admitting: Family Medicine

## 2011-11-30 DIAGNOSIS — I4891 Unspecified atrial fibrillation: Secondary | ICD-10-CM

## 2011-11-30 NOTE — Telephone Encounter (Signed)
I spoke with [name]. I did speak with cardiology earlier. Regarding her prolonged dose of amiodarone and if it is necessary. She's not had any problems with atrial fibrillation and she was told the medication was started to keep her from going into heart failure. I do think that she can come off of this medication. Will refer her to cardiology for consultation regarding this medication

## 2011-12-02 ENCOUNTER — Encounter: Payer: Self-pay | Admitting: Cardiology

## 2011-12-02 ENCOUNTER — Ambulatory Visit (INDEPENDENT_AMBULATORY_CARE_PROVIDER_SITE_OTHER): Payer: Medicare Other | Admitting: Cardiology

## 2011-12-02 VITALS — BP 155/66 | HR 65 | Resp 18 | Ht <= 58 in | Wt 98.0 lb

## 2011-12-02 DIAGNOSIS — I4891 Unspecified atrial fibrillation: Secondary | ICD-10-CM

## 2011-12-02 DIAGNOSIS — I2510 Atherosclerotic heart disease of native coronary artery without angina pectoris: Secondary | ICD-10-CM

## 2011-12-02 NOTE — Assessment & Plan Note (Signed)
Stable and subclinical. Continue secondary prevention.

## 2011-12-02 NOTE — Patient Instructions (Signed)
**Note De-Identified Kymir Coles Obfuscation** Your physician has recommended you make the following change in your medication: take Aspirin 81 mg and continue to take your Amiodarone 200 mg daily  Your physician recommends that you schedule a follow-up appointment in: 1 year

## 2011-12-02 NOTE — Progress Notes (Signed)
HPI [name] is referred today by Dr. Jeanice Lim for consideration of stopping her amiodarone for history of atrial fibrillation.  I look back through the records and cannot find when this was actually started. She remembers it being years ago. Denies any palpitations or clinical symptoms of recurrent A. Fib.  She also has coronary artery disease listed in her problem list. This is largely been subclinical as best I can tell. He is having no angina or ischemic symptoms.  She denies any orthopnea, PND or edema. She does have venous insufficiency and has chronic edema of her lower eextremities and  discoloration of her skin.  She's had a number of falls. She uses a walker to get around. She is not on Coumadin. She bruises easily as well.  Past Medical History  Diagnosis Date  . Hypertension   . Thyroid disease   . Depression   . Cataract   . Osteoporosis   . IBS (irritable bowel syndrome)   . Anemia   . Hearing loss   . Bilateral leg edema   . Stasis ulcer of lower extremity     followed by wound care  . Peripheral neuropathy     Current Outpatient Prescriptions  Medication Sig Dispense Refill  . amiodarone (PACERONE) 200 MG tablet Take 0.5 tablets (100 mg total) by mouth daily.  30 tablet  3  . aspirin EC 81 MG tablet Take 81 mg by mouth every other day.        . Calcium-Vitamin D (CALTRATE 600 PLUS-VIT D PO) Take 1 tablet by mouth 2 (two) times daily.        . cloNIDine (CATAPRES) 0.2 MG tablet 1 tablet daily as directed for blood pressure  30 each  3  . felodipine (PLENDIL) 10 MG 24 hr tablet Take 1 tablet (10 mg total) by mouth daily.  30 tablet  3  . furosemide (LASIX) 20 MG tablet Take 1 tablet (20 mg total) by mouth daily.  30 tablet  6  . gabapentin (NEURONTIN) 300 MG capsule Take 1 capsule (300 mg total) by mouth daily. Takes every morning and two tablets at bedtime for your legs  90 capsule  3  . levothyroxine (SYNTHROID, LEVOTHROID) 75 MCG tablet Take 1 tablet (75 mcg  total) by mouth daily.  30 tablet  6  . Multiple Vitamins-Minerals (SENIOR MULTIVITAMIN PLUS) TABS Take 1 tablet by mouth daily.        . traMADol (ULTRAM) 50 MG tablet Take 50 mg by mouth every 6 (six) hours as needed.        . venlafaxine (EFFEXOR) 37.5 MG tablet Take 1 tablet (37.5 mg total) by mouth daily.  30 tablet  6  . vitamin C (ASCORBIC ACID) 500 MG tablet Take 500 mg by mouth daily.          Allergies  Allergen Reactions  . Acetaminophen-Codeine     REACTION: causes nausea  . Sulfa Antibiotics Other (See Comments)    Makes her 'sick'    Family History  Problem Relation Age of Onset  . Hypertension Mother   . Hypertension Father   . Stroke Father   . Heart disease Brother   . Heart disease Brother     History   Social History  . Marital Status: Widowed    Spouse Name: N/A    Number of Children: N/A  . Years of Education: N/A   Occupational History  . Not on file.   Social History Main Topics  .  Smoking status: Never Smoker   . Smokeless tobacco: Not on file  . Alcohol Use: No     occasional  . Drug Use: No  . Sexually Active: Not on file   Other Topics Concern  . Not on file   Social History Narrative  . No narrative on file    ROS ALL NEGATIVE EXCEPT THOSE NOTED IN HPI  PE  General Appearance: well developed, well nourished in no acute distress, frail HEENT: symmetrical face, PERRLA, Left stabismus  Neck: no JVD, thyromegaly, or adenopathy, trachea midline Chest: symmetric without deformity Cardiac: PMI non-displaced, RRR, normal S1, S2, no gallop or murmur Lung: clear to ausculation and percussion Vascular: all pulses full without bruits  Abdominal: nondistended, nontender, good bowel sounds, no HSM, no bruits Extremities: no cyanosis, clubbing or edema, no sign of DVT, no varicosities, She has chronic discoloration of her lower chin these and reasonable pulses dorsalis pedis bilaterally.ndSkin: normal color, no rashes Neuro: alert and  oriented x 3, non-focal Pysch: normal affect  EKG Normal sinus rhythm, nonspecific ST segment changes area BMET    Component Value Date/Time   NA 141 08/31/2011 1631   K 4.6 08/31/2011 1631   CL 100 08/31/2011 1631   CO2 30 08/31/2011 1631   GLUCOSE 82 08/31/2011 1631   BUN 32* 08/31/2011 1631   CREATININE 1.39* 08/31/2011 1631   CREATININE 1.70* 05/19/2011 1833   CALCIUM 9.8 08/31/2011 1631   CALCIUM 9.3 07/18/2007 2347   GFRNONAA 28* 05/19/2011 1833   GFRAA 34* 05/19/2011 1833    Lipid Panel     Component Value Date/Time   CHOL 234* 08/31/2011 1631   TRIG 136 08/31/2011 1631   HDL 73 08/31/2011 1631   CHOLHDL 3.2 08/31/2011 1631   VLDL 27 08/31/2011 1631   LDLCALC 134* 08/31/2011 1631    CBC    Component Value Date/Time   WBC 5.9 08/31/2011 1631   RBC 4.00 08/31/2011 1631   HGB 12.7 08/31/2011 1631   HCT 39.2 08/31/2011 1631   PLT 254 08/31/2011 1631   MCV 98.0 08/31/2011 1631   MCH 31.8 08/31/2011 1631   MCHC 32.4 08/31/2011 1631   RDW 13.6 08/31/2011 1631   LYMPHSABS 1.1 05/19/2011 1833   MONOABS 0.5 05/19/2011 1833   EOSABS 0.1 05/19/2011 1833   BASOSABS 0.0 05/19/2011 1833

## 2011-12-02 NOTE — Assessment & Plan Note (Signed)
She has done well on low dose amiodarone. I see no reason for discontinuation. Check LFTs and thyroid function studies every 6 months. She needs to stay in sinus rhythm because she is not a good Coumadin candidate. As noted above, she has had multiple falls. I've asked her to take her aspirin 81 mg every day. We will see back in a year.

## 2011-12-10 ENCOUNTER — Ambulatory Visit: Payer: PRIVATE HEALTH INSURANCE | Admitting: Family Medicine

## 2011-12-21 ENCOUNTER — Other Ambulatory Visit: Payer: Self-pay | Admitting: Family Medicine

## 2012-01-11 ENCOUNTER — Telehealth: Payer: Self-pay

## 2012-01-11 MED ORDER — AMOXICILLIN-POT CLAVULANATE 875-125 MG PO TABS: 1.0000 | ORAL_TABLET | Freq: Two times a day (BID) | ORAL | Status: AC

## 2012-01-11 MED ORDER — BENZONATATE 100 MG PO CAPS
100.0000 mg | ORAL_CAPSULE | Freq: Three times a day (TID) | ORAL | Status: DC | PRN
Start: 2012-01-11 — End: 2012-03-14

## 2012-01-11 NOTE — Telephone Encounter (Signed)
Patient aware.

## 2012-01-11 NOTE — Telephone Encounter (Signed)
[name] from [facility] said that she has a cold and cough. Went to see Dr Tanda Rockers last week and now she has a cough with yellow phlegm, ears feel alittle stopped up. Has no transport. Wants to know if you can send something in. [facility] to deliver

## 2012-01-11 NOTE — Telephone Encounter (Signed)
Noted, will send in augmentin and tessalon perrles

## 2012-02-23 ENCOUNTER — Other Ambulatory Visit: Payer: Self-pay | Admitting: Family Medicine

## 2012-02-25 ENCOUNTER — Ambulatory Visit: Payer: PRIVATE HEALTH INSURANCE | Admitting: Family Medicine

## 2012-03-14 ENCOUNTER — Ambulatory Visit (INDEPENDENT_AMBULATORY_CARE_PROVIDER_SITE_OTHER): Payer: Medicare Other | Admitting: Family Medicine

## 2012-03-14 ENCOUNTER — Other Ambulatory Visit: Payer: Self-pay | Admitting: Family Medicine

## 2012-03-14 ENCOUNTER — Encounter: Payer: Self-pay | Admitting: Family Medicine

## 2012-03-14 VITALS — BP 136/52 | HR 66 | Resp 18 | Ht <= 58 in | Wt 102.0 lb

## 2012-03-14 DIAGNOSIS — I10 Essential (primary) hypertension: Secondary | ICD-10-CM

## 2012-03-14 DIAGNOSIS — I4891 Unspecified atrial fibrillation: Secondary | ICD-10-CM

## 2012-03-14 DIAGNOSIS — I872 Venous insufficiency (chronic) (peripheral): Secondary | ICD-10-CM

## 2012-03-14 DIAGNOSIS — E785 Hyperlipidemia, unspecified: Secondary | ICD-10-CM

## 2012-03-14 DIAGNOSIS — G629 Polyneuropathy, unspecified: Secondary | ICD-10-CM

## 2012-03-14 DIAGNOSIS — E039 Hypothyroidism, unspecified: Secondary | ICD-10-CM

## 2012-03-14 DIAGNOSIS — G609 Hereditary and idiopathic neuropathy, unspecified: Secondary | ICD-10-CM

## 2012-03-14 DIAGNOSIS — I2510 Atherosclerotic heart disease of native coronary artery without angina pectoris: Secondary | ICD-10-CM

## 2012-03-14 NOTE — Patient Instructions (Signed)
Continue current medications Think about your living will and end of life care Please get labs done- we will call with results F/U 3 months

## 2012-03-14 NOTE — Progress Notes (Signed)
  Subjective:    Patient ID: [name], female    DOB: 1920/04/22, 77 y.o.   MRN: [id number]  HPI Pt here with son today from [place], pt here to f/u chronic medical problems, doing well no concerns. Medications and history reviewed Preventative care UTD Seen by cardiology, she will continue amiodarone   Review of Systems      GEN-  fatigue,denies  fever, weight loss,weakness, recent illness CVS- denies chest pain, palpitations, +leg swelling RESP- denies SOB, cough, wheeze ABD- denies N/V, change in stools, abd pain GU- denies dysuria- dark, occ foul smelling urine, no pelvic pain or discharge, no vaginal bleeding MSK- + joint pain, muscle aches, injury Neuro- denies headache, dizziness, syncope, seizure activity, no recent falls       Objective:   Physical Exam  GEN- NAD, alert and oriented x 3 HEENT- strabismus of left eye, MMM CVS- RRR, no murmur RESP- CTAB ABD-NABS, soft, NT ND Ext- RLE with tegaderm over skin tears , left covered with ted hose wrapping and boot over crhonic ulcer  Edema -1+ pedal edema much improved bilat Psych- not depressed or anxious appearing      Assessment & Plan:

## 2012-03-15 LAB — CBC
MCV: 90.5 fL (ref 78.0–100.0)
Platelets: 308 10*3/uL (ref 150–400)
RDW: 14.5 % (ref 11.5–15.5)
WBC: 6.2 10*3/uL (ref 4.0–10.5)

## 2012-03-15 LAB — LDL CHOLESTEROL, DIRECT: Direct LDL: 130 mg/dL — ABNORMAL HIGH

## 2012-03-15 LAB — BASIC METABOLIC PANEL
BUN: 32 mg/dL — ABNORMAL HIGH (ref 6–23)
Calcium: 10.3 mg/dL (ref 8.4–10.5)
Glucose, Bld: 82 mg/dL (ref 70–99)

## 2012-03-15 LAB — T4: T4, Total: 12.6 ug/dL — ABNORMAL HIGH (ref 5.0–12.5)

## 2012-03-17 NOTE — Assessment & Plan Note (Signed)
Cardiology note reviewed, no change to meds 

## 2012-03-17 NOTE — Assessment & Plan Note (Signed)
NSR, doing well, continue ASA

## 2012-03-17 NOTE — Assessment & Plan Note (Signed)
Unchanged, continue neurontin

## 2012-03-17 NOTE — Assessment & Plan Note (Signed)
Unchanged, low dose lasix

## 2012-03-17 NOTE — Assessment & Plan Note (Signed)
Unchanged, no statin therapy needed

## 2012-03-17 NOTE — Assessment & Plan Note (Signed)
Blood pressure at goal, no change to meds 

## 2012-03-17 NOTE — Assessment & Plan Note (Signed)
Thyroid studies checked,slightly low TSH, she is doing well on medication, I prefer not to make a change at this time and monitor, will repeat with next lab draw

## 2012-04-04 ENCOUNTER — Other Ambulatory Visit: Payer: Self-pay | Admitting: Family Medicine

## 2012-04-12 ENCOUNTER — Other Ambulatory Visit: Payer: Self-pay | Admitting: Family Medicine

## 2012-04-19 ENCOUNTER — Other Ambulatory Visit: Payer: Self-pay | Admitting: Family Medicine

## 2012-04-26 ENCOUNTER — Encounter (HOSPITAL_COMMUNITY): Payer: Self-pay

## 2012-04-26 ENCOUNTER — Emergency Department (HOSPITAL_COMMUNITY): Payer: Medicare Other

## 2012-04-26 ENCOUNTER — Emergency Department (HOSPITAL_COMMUNITY)
Admission: EM | Admit: 2012-04-26 | Discharge: 2012-04-26 | Disposition: A | Discharge status: Home / Self Care | Payer: Medicare Other | Attending: Emergency Medicine | Admitting: Emergency Medicine

## 2012-04-26 DIAGNOSIS — W19XXXA Unspecified fall, initial encounter: Secondary | ICD-10-CM

## 2012-04-26 DIAGNOSIS — Z7982 Long term (current) use of aspirin: Secondary | ICD-10-CM | POA: Insufficient documentation

## 2012-04-26 DIAGNOSIS — M25569 Pain in unspecified knee: Secondary | ICD-10-CM | POA: Insufficient documentation

## 2012-04-26 DIAGNOSIS — M545 Low back pain, unspecified: Secondary | ICD-10-CM | POA: Insufficient documentation

## 2012-04-26 DIAGNOSIS — N390 Urinary tract infection, site not specified: Secondary | ICD-10-CM

## 2012-04-26 DIAGNOSIS — K589 Irritable bowel syndrome without diarrhea: Secondary | ICD-10-CM | POA: Insufficient documentation

## 2012-04-26 DIAGNOSIS — S51809A Unspecified open wound of unspecified forearm, initial encounter: Secondary | ICD-10-CM | POA: Insufficient documentation

## 2012-04-26 DIAGNOSIS — I10 Essential (primary) hypertension: Secondary | ICD-10-CM | POA: Insufficient documentation

## 2012-04-26 DIAGNOSIS — E079 Disorder of thyroid, unspecified: Secondary | ICD-10-CM | POA: Insufficient documentation

## 2012-04-26 DIAGNOSIS — IMO0002 Reserved for concepts with insufficient information to code with codable children: Secondary | ICD-10-CM

## 2012-04-26 DIAGNOSIS — Z79899 Other long term (current) drug therapy: Secondary | ICD-10-CM | POA: Insufficient documentation

## 2012-04-26 DIAGNOSIS — Y92009 Unspecified place in unspecified non-institutional (private) residence as the place of occurrence of the external cause: Secondary | ICD-10-CM | POA: Insufficient documentation

## 2012-04-26 DIAGNOSIS — M25559 Pain in unspecified hip: Secondary | ICD-10-CM | POA: Insufficient documentation

## 2012-04-26 DIAGNOSIS — W010XXA Fall on same level from slipping, tripping and stumbling without subsequent striking against object, initial encounter: Secondary | ICD-10-CM | POA: Insufficient documentation

## 2012-04-26 LAB — URINALYSIS, ROUTINE W REFLEX MICROSCOPIC
Nitrite: POSITIVE — AB
Protein, ur: NEGATIVE mg/dL
Specific Gravity, Urine: 1.01 (ref 1.005–1.030)
Urobilinogen, UA: 0.2 mg/dL (ref 0.0–1.0)

## 2012-04-26 LAB — URINE MICROSCOPIC-ADD ON

## 2012-04-26 MED ORDER — BACITRACIN ZINC 500 UNIT/GM EX OINT
TOPICAL_OINTMENT | CUTANEOUS | Status: AC
  Filled 2012-04-26: qty 0.9

## 2012-04-26 MED ORDER — CEPHALEXIN 500 MG PO CAPS: 500.0000 mg | ORAL_CAPSULE | Freq: Four times a day (QID) | ORAL | Status: AC

## 2012-04-26 NOTE — ED Provider Notes (Signed)
History  Scribed for Tammy Anger, DO, the patient was seen in room APA16A/APA16A. This chart was scribed by Candelaria Stagers. The patient's care started at 6:45 PM  CSN: [id number]  Arrival date & time 04/26/12  7829   First MD Initiated Contact with Patient 04/26/12 1821      Chief Complaint  Patient presents with  . Fall     HPI EVVA DIN is a 78 y.o. female who presents to the Emergency Department c/o sudden onset and resolution of one episode of fall in her kitchen that occurred earlier today.  States her left knee "gave out" and she fell to the left side, onto her walker.  Pt endorses several week history of her left hip and knee "hurting" and "giving out sometimes;" and has been followed by her PMD for same.  Today she c/o multiple skin tears to her left arm.  Denies hitting head, no LOC, no AMS since fall, no prodromal symptoms before fall, no CP/SOB, no abd pain, no neck or back pain, no N/V/D, no tingling/numbness in extremities, no focal motor weakness.      Td UTD Past Medical History  Diagnosis Date  . Hypertension   . Thyroid disease   . Depression   . Cataract   . Osteoporosis   . IBS (irritable bowel syndrome)   . Anemia   . Hearing loss   . Bilateral leg edema   . Stasis ulcer of lower extremity     followed by wound care  . Peripheral neuropathy     Past Surgical History  Procedure Date  . Back surgery x2   . Neck surgery   . Hip surgery   . Eye surgery     CATARACT REMOVAL  . Abdominal hysterectomy     Family History  Problem Relation Age of Onset  . Hypertension Mother   . Hypertension Father   . Stroke Father   . Heart disease Brother   . Heart disease Brother     History  Substance Use Topics  . Smoking status: Never Smoker   . Smokeless tobacco: Not on file  . Alcohol Use: No     occasional    Review of Systems ROS: Statement: All systems negative except as marked or noted in the HPI; Constitutional: Negative for fever and  chills. ; ; Eyes: Negative for eye pain, redness and discharge. ; ; ENMT: Negative for ear pain, hoarseness, nasal congestion, sinus pressure and sore throat. ; ; Cardiovascular: Negative for chest pain, palpitations, diaphoresis, dyspnea and peripheral edema. ; ; Respiratory: Negative for cough, wheezing and stridor. ; ; Gastrointestinal: Negative for nausea, vomiting, diarrhea, abdominal pain, blood in stool, hematemesis, jaundice and rectal bleeding. . ; ; Genitourinary: Negative for dysuria, flank pain and hematuria. ; ; Musculoskeletal: +left knee and hip pain. Negative for back pain and neck pain. Negative for swelling.; ; Skin: +skin tears, bruising. Negative for pruritus, rash, blisters, and skin lesion.; ; Neuro: Negative for headache, lightheadedness and neck stiffness. Negative for weakness, altered level of consciousness , altered mental status, extremity weakness, paresthesias, involuntary movement, seizure and syncope.     Allergies  Acetaminophen-codeine and Sulfa antibiotics  Home Medications   Current Outpatient Rx  Name Route Sig Dispense Refill  . ASPIRIN EC 81 MG PO TBEC Oral Take 81 mg by mouth every other day.      Marland Kitchen CALTRATE 600 PLUS-VIT D PO Oral Take 1 tablet by mouth 2 (two) times daily.      Marland Kitchen  CLONIDINE HCL 0.2 MG PO TABS  TAKE ONE TABLET DAILY FOR BLOOD PRESSURE. 30 tablet 6  . CORDARONE 200 MG PO TABS  TAKE (1/2) TABLET BY MOUTH ONCE DAILY AS DIRECTED. 15 each 3  . GABAPENTIN 300 MG PO CAPS Oral Take 1 capsule (300 mg total) by mouth daily. Takes every morning and two tablets at bedtime for your legs 90 capsule 3    Please deliver, dose change  . LASIX 20 MG PO TABS  TAKE ONE TABLET DAILY. 30 each 3  . SENIOR MULTIVITAMIN PLUS PO TABS Oral Take 1 tablet by mouth daily.      Marland Kitchen PLENDIL 10 MG PO TB24  TAKE ONE TABLET DAILY. 30 each 3  . SYNTHROID 75 MCG PO TABS  TAKE ONE TABLET DAILY. 30 each 3    Dispense as written.  . TRAMADOL HCL 50 MG PO TABS Oral Take 50 mg by  mouth every 6 (six) hours as needed.      . VENLAFAXINE HCL 37.5 MG PO TABS  TAKE ONE TABLET DAILY. 30 tablet 3  . VITAMIN C 500 MG PO TABS Oral Take 500 mg by mouth daily.        BP 179/51  Temp 97.8 F (36.6 C) (Oral)  Resp 16  Ht 4\' 11"  (1.499 m)  Wt 102 lb (46.267 kg)  BMI 20.60 kg/m2  SpO2 96%  Physical Exam 1850: Physical examination:  Nursing notes reviewed; Vital signs and O2 SAT reviewed;  Constitutional: Well developed, Well nourished, Well hydrated, In no acute distress; Head:  Normocephalic, atraumatic; Eyes: EOMI, PERRL, No scleral icterus; ENMT: Mouth and pharynx normal, Mucous membranes moist; Neck: Supple, Full range of motion, No lymphadenopathy; Cardiovascular: Regular rate and rhythm, No gallop; Respiratory: Breath sounds clear & equal bilaterally, No wheezes.  Speaking full sentences with ease, Normal respiratory effort/excursion; Chest: Nontender, No ecchymosis or abrasions. Movement normal; Abdomen: Soft, Nontender, Nondistended, Normal bowel sounds; Genitourinary: No CVA tenderness; Spine:  No midline CS, TS, LS tenderness. ;; Extremities: Pulses normal, Pelvis stable. +left hip TTP, able to lift extended LLE off stretcher, otherwise FROM major/large joints of bilat UE's and LE's without tenderness to palp. +chronic stasis changes bilat LE's. No edema, No calf edema or asymmetry. +NT to palp left knee with FROM left knee, including able to lift extended LLE off stretcher, and extend left lower leg against gravity.  No ligamentous laxity.  No patellar or quad tendon step-offs.  NMS intact left foot, strong pedal pp.  No left proximal fibular head tenderness.  No edema, erythema, warmth, deformity or open wounds.  +ecchymosis to medial left knee.; Neuro: AA&Ox3, Major CN grossly intact.  Speech clear. No gross focal motor or sensory deficits in extremities.; Skin: Color normal, Warm, Dry, +multiple hemostatic superficial skin tears with ecchymosis: approx 1-2cm at left dorsal  hand 1st metacarpal area, approx 10cm at left dorsal lateral mid-forearm, approx 2-3cm at left medial proximal elbow.    ED Course  Procedures    10:16 PM:  Multiple skin tears cleaned and covered with non-stick/bulky DSD.  Very thin/fragile skin flap of the larger skin tear on forearm was steri-stripped (#3) in place to try to cover exposed underlying skin and further prevent thin flap from "rolling up" under the DSD.  Pt's family requested to "check her urine because it smells bad."  +UTI, UC pending.  Pt has stood and ambulated from stretcher to bedside commode with baseline gait per family at bedside, resps easy, NAD.  (Pt  usually walks with walker at home.)  Pt and family want to go home now.  Pt already has a previously scheduled doctor's appt for tomorrow.  Dx testing d/w pt and family.  Questions answered.  Verb understanding, agreeable to d/c home with outpt f/u tomorrow.    MDM  MDM Reviewed: nursing note and vitals Interpretation: x-ray and labs    Results for orders placed during the hospital encounter of 04/26/12  URINALYSIS, ROUTINE W REFLEX MICROSCOPIC      Component Value Range   Color, Urine YELLOW  YELLOW   APPearance CLEAR  CLEAR   Specific Gravity, Urine 1.010  1.005 - 1.030   pH 7.5  5.0 - 8.0   Glucose, UA NEGATIVE  NEGATIVE mg/dL   Hgb urine dipstick TRACE (*) NEGATIVE   Bilirubin Urine NEGATIVE  NEGATIVE   Ketones, ur NEGATIVE  NEGATIVE mg/dL   Protein, ur NEGATIVE  NEGATIVE mg/dL   Urobilinogen, UA 0.2  0.0 - 1.0 mg/dL   Nitrite POSITIVE (*) NEGATIVE   Leukocytes, UA NEGATIVE  NEGATIVE  URINE MICROSCOPIC-ADD ON      Component Value Range   Squamous Epithelial / LPF RARE  RARE   WBC, UA 0-2  <3 WBC/hpf   RBC / HPF 0-2  <3 RBC/hpf   Bacteria, UA MANY (*) RARE   Dg Lumbar Spine Complete 04/26/2012  *RADIOLOGY REPORT*  Clinical Data: Low back pain secondary to a fall today.  LUMBAR SPINE - COMPLETE 4+ VIEW  Comparison: Radiographs dated 01/26/2009   Findings: There is no acute abnormality of the lumbar spine.  There are old compression deformities of T11 and L2.  There is a grade 1 spondylolisthesis of L5 on S1, unchanged.  Previous posterior fusions at L1-2 and L2-3.  Extensive calcification in the abdominal aorta and iliac vessels.  Degenerative facet arthritis at L3-4, L4- 5 and L5-S1.  IMPRESSION: No acute abnormality.  Diffuse degenerative changes in the lumbar spine as described.  Original Report Authenticated By: Gwynn Burly, M.D.   Dg Elbow Complete Left 04/26/2012  *RADIOLOGY REPORT*  Clinical Data: The fall, pain.  LEFT ELBOW - COMPLETE 3+ VIEW  Comparison: The none.  Findings: No acute bony abnormality.  Specifically, no fracture, subluxation, or dislocation.  Soft tissues are intact.  No joint effusion.  IMPRESSION: No acute intracranial abnormality.  Original Report Authenticated By: Cyndie Chime, M.D.   Dg Hip Complete Left 04/26/2012  *RADIOLOGY REPORT*  Clinical Data: Left hip pain secondary to a fall.  LEFT HIP - COMPLETE 2+ VIEW  Comparison: 07/22/2011  Findings: There are old healed fractures of the left inferior and superior pubic rami.  Osseous structures of the left hip are normal.  Old healed fracture of the proximal right femur with intramedullary rod and screw in place.  No acute abnormality of the pelvic bones.  IMPRESSION: Normal left hip.  Original Report Authenticated By: Gwynn Burly, M.D.   Dg Knee Complete 4 Views Left 04/26/2012  *RADIOLOGY REPORT*  Clinical Data: Left knee pain secondary to a fall today.  LEFT KNEE - COMPLETE 4+ VIEW  Comparison: Radiographs dated 10/17/2010  Findings: There is no fracture, dislocation, or joint effusion.  No appreciable arthritic changes.  Extensive arterial vascular calcification around the knee.  IMPRESSION: No acute osseous abnormality.  Original Report Authenticated By: Gwynn Burly, M.D.   Dg Hand Complete Left 04/26/2012  *RADIOLOGY REPORT*  Clinical Data: Fall, hand  pain.  LEFT HAND - COMPLETE 3+ VIEW  Comparison: None  Findings: No acute bony abnormality.  Specifically, no fracture, subluxation, or dislocation.  Soft tissues are intact.  Severe arthritic changes in the IP joints diffusely and the first carpal metacarpal joint.  Diffuse osteopenia.  IMPRESSION: No acute bony abnormality.  Original Report Authenticated By: Cyndie Chime, M.D.      I personally performed the services described in this documentation, which was scribed in my presence. The recorded information has been reviewed and considered. Kayana Thoen Allison Quarry, DO 04/27/12 1947

## 2012-04-26 NOTE — ED Notes (Signed)
All wounds cleaned with Saf-clens    Three 1/8th in steri stips applied to the largest tear to anchor skin in place over the wound as best as possible ( Dr. Clarene Duke)  Dressed with Telfa non stick dressing and dry sterile gauze pads.

## 2012-04-26 NOTE — ED Notes (Signed)
Pt was in kitchen and "knees gave out on me" per pt. Pt lowered self to floor. Skin tear to left arm and hand. Denies any loc and hitting head.

## 2012-04-27 ENCOUNTER — Emergency Department (HOSPITAL_COMMUNITY)
Admission: EM | Admit: 2012-04-27 | Discharge: 2012-04-27 | Disposition: A | Discharge status: Home / Self Care | Payer: Medicare Other | Attending: Emergency Medicine | Admitting: Emergency Medicine

## 2012-04-27 ENCOUNTER — Telehealth: Payer: Self-pay | Admitting: Family Medicine

## 2012-04-27 ENCOUNTER — Encounter (HOSPITAL_COMMUNITY): Payer: Self-pay

## 2012-04-27 DIAGNOSIS — S51809A Unspecified open wound of unspecified forearm, initial encounter: Secondary | ICD-10-CM | POA: Insufficient documentation

## 2012-04-27 DIAGNOSIS — T148XXA Other injury of unspecified body region, initial encounter: Secondary | ICD-10-CM

## 2012-04-27 DIAGNOSIS — I10 Essential (primary) hypertension: Secondary | ICD-10-CM | POA: Insufficient documentation

## 2012-04-27 DIAGNOSIS — S41009A Unspecified open wound of unspecified shoulder, initial encounter: Secondary | ICD-10-CM | POA: Insufficient documentation

## 2012-04-27 DIAGNOSIS — E079 Disorder of thyroid, unspecified: Secondary | ICD-10-CM | POA: Insufficient documentation

## 2012-04-27 DIAGNOSIS — D649 Anemia, unspecified: Secondary | ICD-10-CM | POA: Insufficient documentation

## 2012-04-27 DIAGNOSIS — W19XXXA Unspecified fall, initial encounter: Secondary | ICD-10-CM | POA: Insufficient documentation

## 2012-04-27 DIAGNOSIS — S81009A Unspecified open wound, unspecified knee, initial encounter: Secondary | ICD-10-CM | POA: Insufficient documentation

## 2012-04-27 DIAGNOSIS — S91009A Unspecified open wound, unspecified ankle, initial encounter: Secondary | ICD-10-CM | POA: Insufficient documentation

## 2012-04-27 DIAGNOSIS — M810 Age-related osteoporosis without current pathological fracture: Secondary | ICD-10-CM | POA: Insufficient documentation

## 2012-04-27 NOTE — ED Notes (Signed)
Assisted to portable toilet by this nurse. Voided about 200 cc clear, amber colored urine. Placed back in bed. Denies needs. Daughter at bedside.

## 2012-04-27 NOTE — ED Notes (Signed)
Patient states she is ready to go home. MD at bedside to speak with patient.

## 2012-04-27 NOTE — ED Notes (Signed)
MD at bedside to evaluate patient. This nurse also in with patient. Dressings removed from right shoulder, right lower leg, right elbow\ forearm, and left knee. All wounds covered with vasaline dressing by EMS. Per MD, does not want to remove dressings. Patient tolerating well. States she has a jerking sensation in left upper leg above knee. States that it start jerking prior to this fall and that is what made her fall.

## 2012-04-27 NOTE — Telephone Encounter (Signed)
Spoke with patient. She was seen in ED twice for recurrent fall. The first one occurred in the home or her a walker tilted she ended up with skin tears only. Imaging was negative for fractures. She continues to have pain and spasms of the left thigh right above the knee. She's he tells me she has bruising all over. She's appointment with wound care this afternoon at 1:00 to have her wounds tended to I advised her to hold her aspirin and she is still having some bleeding. She will also have him check her leg which is been causing her more pain and spasm. She does have Ultram at home I'm concerned to give her something stronger she does live alone in his artery fallen twice. She will followup with my office on Monday if she is not improved we are closed for the [facility]

## 2012-04-27 NOTE — ED Notes (Signed)
Patient assisted to bedside commode by tech. Steady gait.

## 2012-04-27 NOTE — ED Notes (Signed)
Into room to assess patient. States she fell this evening in her bathroom. Occurred about one hour prior to arrival. Denies any neck pain or losing consciousness. Per EMS, skin tears to right shoulder\ upper arm, right elbow, right lower leg near knee, and left knee. Equal chest rise and fall, regular, unlabored. s1 and s2 present. No extra heart sounds. A&O x 4. Strong bilateral petal pulses with brisk cap refill. Daughter with patient. Given several warm blankets. Bed in low position and locked with side rails up. Will continue to monitor.

## 2012-04-27 NOTE — ED Provider Notes (Signed)
History     CSN: [id number]  Arrival date & time 04/27/12  0045   First MD Initiated Contact with Patient 04/27/12 0121      Chief Complaint  Patient presents with  . Fall    (Consider location/radiation/quality/duration/timing/severity/associated sxs/prior treatment) HPI This is an 78 year old white female with a history of arthritis in her left knee and hip which causes her to fall on occasion. She was seen here yesterday for a fall. X-ray showed no evidence of fracture but she did have a skin tear of the left forearm which was treated. She has subsequently fallen again and now has new skin tears to the right shoulder right forearm left knee and right shin. These were bandaged with Vaseline gauze by EMS prior to arrival. There is minimal pain associated with them. She denies neck pain or injury. There is no deformity.  Past Medical History  Diagnosis Date  . Hypertension   . Thyroid disease   . Depression   . Cataract   . Osteoporosis   . IBS (irritable bowel syndrome)   . Anemia   . Hearing loss   . Bilateral leg edema   . Stasis ulcer of lower extremity     followed by wound care  . Peripheral neuropathy     Past Surgical History  Procedure Date  . Back surgery x2   . Neck surgery   . Hip surgery   . Eye surgery     CATARACT REMOVAL  . Abdominal hysterectomy     Family History  Problem Relation Age of Onset  . Hypertension Mother   . Hypertension Father   . Stroke Father   . Heart disease Brother   . Heart disease Brother     History  Substance Use Topics  . Smoking status: Never Smoker   . Smokeless tobacco: Not on file  . Alcohol Use: No     occasional    OB History    Grav Para Term Preterm Abortions TAB SAB Ect Mult Living                  Review of Systems  All other systems reviewed and are negative.    Allergies  Acetaminophen-codeine and Sulfa antibiotics  Home Medications   Current Outpatient Rx  Name Route Sig Dispense Refill    . ASPIRIN EC 81 MG PO TBEC Oral Take 81 mg by mouth every morning.     Marland Kitchen CALTRATE 600 PLUS-VIT D PO Oral Take 1 tablet by mouth 2 (two) times daily.      . CEPHALEXIN 500 MG PO CAPS Oral Take 1 capsule (500 mg total) by mouth 4 (four) times daily. 40 capsule 0  . CLONIDINE HCL 0.2 MG PO TABS      . CORDARONE 200 MG PO TABS  TAKE (1/2) TABLET BY MOUTH ONCE DAILY AS DIRECTED. 15 each 3  . GABAPENTIN 300 MG PO CAPS Oral Take 300-600 mg by mouth 2 (two) times daily. Take one capsule every morning and two capsules at bedtime for your legs    . LASIX 20 MG PO TABS  TAKE ONE TABLET DAILY. 30 each 3  . SENIOR MULTIVITAMIN PLUS PO TABS Oral Take 1 tablet by mouth every morning.     Marland Kitchen PLENDIL 10 MG PO TB24  TAKE ONE TABLET DAILY. 30 each 3  . SYSTANE OP Ophthalmic Apply 1-2 drops to eye daily.    Marland Kitchen SYNTHROID 75 MCG PO TABS  TAKE ONE TABLET DAILY.  30 each 3    Dispense as written.  . TRAMADOL HCL 50 MG PO TABS Oral Take 50 mg by mouth every 6 (six) hours as needed.      . VENLAFAXINE HCL 37.5 MG PO TABS  TAKE ONE TABLET DAILY. 30 tablet 3    BP 168/82  Pulse 73  Temp 97.8 F (36.6 C) (Oral)  Resp 16  Ht 4\' 11"  (1.499 m)  Wt 102 lb (46.267 kg)  BMI 20.60 kg/m2  SpO2 98%  Physical Exam General: Well-developed, well-nourished female in no acute distress; appearance consistent with age of record HENT: normocephalic, atraumatic Eyes: pupils equal round and reactive to light; left lateral strabismus Neck: supple; nontender Heart: regular rate and rhythm Lungs: clear to auscultation bilaterally Abdomen: soft; nondistended; nontender Extremities: No deformity; decreased range of motion left knee Neurologic: Awake, alert and oriented; motor function intact in all extremities and symmetric; no facial droop Skin: Skin tears of bilateral forearms, right shoulder, left knee and right shin Psychiatric: Normal mood and affect    ED Course  Procedures (including critical care time)     MDM   Wounds dressed in ED. Patient has an appointment with her wound care specialist later today. She was encouraged to keep that appointment.          Hanley Seamen, MD 04/27/12 [phone]

## 2012-04-27 NOTE — ED Notes (Signed)
Into room for dressing changes on all wounds. Patient tolerating well. Denies needs.

## 2012-04-30 LAB — URINE CULTURE

## 2012-05-01 NOTE — ED Notes (Signed)
+  Urine. Patient treated with Keflex. Sensitive to same. Per protocol MD. °

## 2012-05-02 ENCOUNTER — Telehealth: Payer: Self-pay | Admitting: Orthopedic Surgery

## 2012-05-02 NOTE — Telephone Encounter (Signed)
Please review the xrays done at [facility] for [name].  Jennifer Anderson/[facility] called to request an appointment for the patient. Victorino Dike is seeing the patient for another problem, but as she cannot standup she is concerned if there might be  an orthopedic problem.  Several xrays were done at the ER.  Please review the ER reports and advise if OK to schedule.

## 2012-05-03 ENCOUNTER — Telehealth: Payer: Self-pay | Admitting: Family Medicine

## 2012-05-03 NOTE — Telephone Encounter (Signed)
Spoke with caregiver, Annice Needy and advised of doctor's reply.  Also called Laney Potash at [facility], she is trying to get her in to see her primary care doctor.

## 2012-05-03 NOTE — Telephone Encounter (Signed)
Send her back to er if she cant walk

## 2012-05-04 ENCOUNTER — Other Ambulatory Visit: Payer: Self-pay | Admitting: Family Medicine

## 2012-05-04 ENCOUNTER — Telehealth: Payer: Self-pay

## 2012-05-04 NOTE — Telephone Encounter (Signed)
Yes, that is fine, she can take the tylenol if she does not improve please schedule her in for a visit for tomorrow or Friday AM

## 2012-05-05 ENCOUNTER — Ambulatory Visit: Payer: PRIVATE HEALTH INSURANCE | Admitting: Family Medicine

## 2012-05-05 NOTE — Telephone Encounter (Signed)
Wanted [place] to be aware that [employer] is going to be sending some forms. Dr Jeanice Lim is aware

## 2012-05-06 ENCOUNTER — Telehealth: Payer: Self-pay | Admitting: Family Medicine

## 2012-05-09 ENCOUNTER — Telehealth: Payer: Self-pay

## 2012-05-09 MED ORDER — CYCLOBENZAPRINE HCL 5 MG PO TABS: 5.0000 mg | ORAL_TABLET | Freq: Every evening | ORAL | Status: DC | PRN

## 2012-05-09 NOTE — Telephone Encounter (Signed)
[name] with [facility] aware of order and she stated that she would notify pt that the flexeril is prescribed.

## 2012-05-09 NOTE — Telephone Encounter (Addendum)
Okay give verbal order Flexeril 5mg  at bedtime, if this makes her too drowsy or disoriented please stop

## 2012-05-10 ENCOUNTER — Telehealth: Payer: Self-pay | Admitting: Family Medicine

## 2012-05-10 NOTE — Telephone Encounter (Signed)
Called back and left message.

## 2012-05-10 NOTE — Telephone Encounter (Signed)
I called and spoke with pt and she said to be expecting a form from [employer] that  Needed to be filled out and that Dr Jeanice Lim had filled one out for her before

## 2012-05-11 ENCOUNTER — Telehealth: Payer: Self-pay | Admitting: Family Medicine

## 2012-05-12 NOTE — Telephone Encounter (Signed)
Dr Jeanice Lim has addressed

## 2012-05-12 NOTE — Telephone Encounter (Signed)
Spoke with son, there is a Manufacturing systems engineer form that will need to be completed because her change in state, she is now requiring help feeding, bathing, transferring, PT is working with her, she is very weak and debiliatated though her mind remains in high spirits. [facility] is also following her, this all stems from Fall almost 2 weeks ago Will schedule pt for home visit, await forms to be completed

## 2012-05-13 ENCOUNTER — Encounter: Payer: Self-pay | Admitting: Family Medicine

## 2012-05-13 ENCOUNTER — Ambulatory Visit: Payer: Medicare Other | Admitting: Family Medicine

## 2012-05-13 VITALS — BP 138/52 | HR 63 | Temp 99.4°F | Resp 20

## 2012-05-13 DIAGNOSIS — R296 Repeated falls: Secondary | ICD-10-CM

## 2012-05-13 DIAGNOSIS — R29818 Other symptoms and signs involving the nervous system: Secondary | ICD-10-CM

## 2012-05-13 DIAGNOSIS — I10 Essential (primary) hypertension: Secondary | ICD-10-CM

## 2012-05-13 DIAGNOSIS — R0602 Shortness of breath: Secondary | ICD-10-CM

## 2012-05-13 DIAGNOSIS — M62838 Other muscle spasm: Secondary | ICD-10-CM

## 2012-05-13 DIAGNOSIS — R269 Unspecified abnormalities of gait and mobility: Secondary | ICD-10-CM

## 2012-05-13 DIAGNOSIS — I4891 Unspecified atrial fibrillation: Secondary | ICD-10-CM

## 2012-05-13 DIAGNOSIS — T148XXA Other injury of unspecified body region, initial encounter: Secondary | ICD-10-CM

## 2012-05-13 DIAGNOSIS — I872 Venous insufficiency (chronic) (peripheral): Secondary | ICD-10-CM

## 2012-05-13 MED ORDER — FUROSEMIDE 20 MG PO TABS: ORAL_TABLET | ORAL | Status: DC

## 2012-05-13 NOTE — Progress Notes (Signed)
  Subjective:    Patient ID: [name], female    DOB: Mar 18, 1920, 78 y.o.   MRN: [id number]  HPI Pt seen for Home visit, she is s/p fall x 2 on July 2nd, see EMR for details, this resulted in multiple skin tears and bruising a well as muscle spasms on lower extreme ties and severe weakness. She was also treated for UTI. The past 3 weeks she has required 24 hour care for transfers from bed to chair, assistance in bathing, preparing meals and dressing. She has Physical therapy for strengthening and ROM. She has has some mild SOB , HH RN called today and Portable CXR was ordered. She denies chest pain, cough, abdominal pain, bleeding. Wounds are being tended to by [facility].    Review of Systems   GEN- + fatigue, fever, weight loss,weakness, recent illness HEENT- denies eye drainage, change in vision, nasal discharge, CVS- denies chest pain, palpitations RESP- + SOB, denies cough, wheeze ABD- denies N/V, change in stools, abd pain GU- denies dysuria, hematuria, dribbling, incontinence MSK- denies joint pain,+ muscle aches, injury Neuro- denies headache, dizziness, syncope, seizure activity      Objective:   Physical Exam  GEN- NAD, alert and oriented x 3, weak appearing , unable to stand unassisted HEENT- strabismus of left eye, MMM, PERRL, EOMI, TM Clear bilat hearing aides - TM clear bilat no effusion Neck- supple- no LAD, non tender  CVS- RRR, no murmur RESP- crackles in bases on lungs bilat  ABD-NABS, soft, NT ND, no CVA tenderness  Ext-multiple covered skin tears on upper and lower ext, bandage to large tear and bruising on right calf, chronic ulcer on left ankle   Edema -1+ edema lower ext MSK- decreased ROM right ankle,  Psych- not depressed or anxious appearing Neuro- CNII-XII grossly in tact   CXR- mild CHF, small left pleural effusion  Home has no throw rugs Carpet in main living area, linoleum floors in kitchen  Small step up from car porch entrance Very clean  Eating  well    Assessment & Plan:

## 2012-05-14 ENCOUNTER — Telehealth: Payer: Self-pay | Admitting: Family Medicine

## 2012-05-14 NOTE — Telephone Encounter (Signed)
Spoke with [facility], lab results show WBC 5.6 Hb 10.4 HCT 31.4 Pro-BNP < 451 (355.1- is exact result ) BUN 25 Cr 1.21  Her lungs are clear today, she has 1+ edema on left LE to shins Oxygen sat 93% no SOB, no CP per RN Pt given results- continue Lasix 20mg  BID x 3 days, stop on Sunday  She can continue the flexeril for the next week, her spasms are improving as her therapy improves She tells me she feels good today, she had a lot of output yesterday evening and last night

## 2012-05-15 ENCOUNTER — Encounter: Payer: Self-pay | Admitting: Family Medicine

## 2012-05-15 DIAGNOSIS — R269 Unspecified abnormalities of gait and mobility: Secondary | ICD-10-CM | POA: Insufficient documentation

## 2012-05-15 DIAGNOSIS — R0602 Shortness of breath: Secondary | ICD-10-CM | POA: Insufficient documentation

## 2012-05-15 DIAGNOSIS — T148XXA Other injury of unspecified body region, initial encounter: Secondary | ICD-10-CM | POA: Insufficient documentation

## 2012-05-15 DIAGNOSIS — M62838 Other muscle spasm: Secondary | ICD-10-CM | POA: Insufficient documentation

## 2012-05-15 NOTE — Assessment & Plan Note (Signed)
CXR showed concern for mild CHF, with small pleural effusion and vascular congestion, no evidence of infiltrate, she has no CHF history, Echo cardiogram done in 2009 showed preserved function with some pulmonary HTN, this is likley in setting of her decompensation, inmobility. Will increase her lasix to 20mg  BID x 3 days, her oxygen sats are little lower than her baseline but overall she looks stable cardiopulmonary wise, if there are any changes EMS is to be called. I will have HHRN go out tomorrow to check BMET, CBC, BNP on her and recheck exam, given extra dose of lasix in my presence today.

## 2012-05-15 NOTE — Assessment & Plan Note (Signed)
NSR currently, no CP, but she has occasional SOB

## 2012-05-15 NOTE — Assessment & Plan Note (Signed)
Worsened by immobility, per above, elevation of legs

## 2012-05-15 NOTE — Assessment & Plan Note (Signed)
BP stable no change to meds 

## 2012-05-15 NOTE — Assessment & Plan Note (Signed)
Repeated fall, she now requires the level of 24 hour care or SNF, I have discussed with her son, she indeed needs 24 hour care based on my assessment today.

## 2012-05-15 NOTE — Assessment & Plan Note (Signed)
Intermittant muscle spasms which she has had in the past, she also has peripheral neuropahty, gabapentin BID, flexeril short term qhs  Which has been helping as she is non ambulatory

## 2012-05-24 ENCOUNTER — Telehealth: Payer: Self-pay | Admitting: Family Medicine

## 2012-05-24 NOTE — Telephone Encounter (Signed)
Called to check on pt, she still needs assistance ambulating and transferring from bed but is weight bearing and walking short distances in the home, no SOB PT continues Continues to have spasms in her legs  She feels she is improving Will f/u via phone again in 1 week

## 2012-05-26 ENCOUNTER — Encounter: Payer: Self-pay | Admitting: Family Medicine

## 2012-05-26 ENCOUNTER — Telehealth: Payer: Self-pay | Admitting: Family Medicine

## 2012-05-26 NOTE — Telephone Encounter (Signed)
Please call and ask for recertification papers, I would like BP checked, wound care and medication management

## 2012-05-26 NOTE — Telephone Encounter (Signed)
I believe that she is talking about with [facility].

## 2012-05-30 MED ORDER — CEPHALEXIN 500 MG PO CAPS: 500.0000 mg | ORAL_CAPSULE | Freq: Four times a day (QID) | ORAL | Status: AC

## 2012-05-30 NOTE — Telephone Encounter (Signed)
[name] aware) Pt ahs also been c/o dysuria and foul smelling urine. Had tried to increase fluid intake over the weekend but she is still having problems. Gave order for UA UC to nurse and she will do today and send results.

## 2012-05-30 NOTE — Telephone Encounter (Signed)
Please call to make sure urine sample has been collected. She can start antibiotics was sample has been collected and we will call if things need to be changed

## 2012-05-31 NOTE — Telephone Encounter (Signed)
Patient aware.

## 2012-06-03 ENCOUNTER — Telehealth: Payer: Self-pay | Admitting: Family Medicine

## 2012-06-03 NOTE — Telephone Encounter (Signed)
Pt doing well, on antibiotics, no concerns currently

## 2012-06-09 ENCOUNTER — Encounter: Payer: Self-pay | Admitting: Family Medicine

## 2012-06-14 ENCOUNTER — Ambulatory Visit: Payer: PRIVATE HEALTH INSURANCE | Admitting: Family Medicine

## 2012-06-28 ENCOUNTER — Telehealth: Payer: Self-pay | Admitting: Family Medicine

## 2012-06-28 NOTE — Telephone Encounter (Signed)
Needs oV today based on symptoms , pls work in based on patient's no show rate, morning or afternoon

## 2012-06-28 NOTE — Telephone Encounter (Signed)
Pt stated she had appt for Monday. She would just keep that one.

## 2012-06-28 NOTE — Telephone Encounter (Signed)
Sallie offered appt. Pt refused and said she already had appt for Sept 9. The wheezing was heard upon getting out of bed this am and it is better now. The [place] nurse came to check her and she said she sounded ok with maybe just a small amount of congestion heard on the left side. FYI- Caregiver Toniann Fail states she slid down to the floor when her leg gave out on her Sunday but it was an easy fall and her left leg is just a little sore from it but she already has tramadol for pain.

## 2012-06-28 NOTE — Telephone Encounter (Signed)
noted 

## 2012-06-28 NOTE — Telephone Encounter (Signed)
Got to her house this morning and she was wheezing and SOB. Can hear the wheezing from across the room. Is coughing some but nothing is coming up. Just wanted to let someone know incase she needed to be seen. [facility] is coming back tomorrow

## 2012-06-29 ENCOUNTER — Ambulatory Visit (INDEPENDENT_AMBULATORY_CARE_PROVIDER_SITE_OTHER): Payer: Medicare Other | Admitting: Family Medicine

## 2012-06-29 ENCOUNTER — Encounter: Payer: Self-pay | Admitting: Family Medicine

## 2012-06-29 VITALS — BP 120/62 | HR 68 | Temp 100.4°F | Resp 18 | Ht <= 58 in

## 2012-06-29 DIAGNOSIS — N3000 Acute cystitis without hematuria: Secondary | ICD-10-CM

## 2012-06-29 DIAGNOSIS — J209 Acute bronchitis, unspecified: Secondary | ICD-10-CM

## 2012-06-29 DIAGNOSIS — M25569 Pain in unspecified knee: Secondary | ICD-10-CM

## 2012-06-29 DIAGNOSIS — I10 Essential (primary) hypertension: Secondary | ICD-10-CM

## 2012-06-29 DIAGNOSIS — R509 Fever, unspecified: Secondary | ICD-10-CM

## 2012-06-29 DIAGNOSIS — M25562 Pain in left knee: Secondary | ICD-10-CM

## 2012-06-29 LAB — CBC WITH DIFFERENTIAL/PLATELET
Basophils Relative: 1 % (ref 0–1)
Eosinophils Absolute: 0.6 10*3/uL (ref 0.0–0.7)
Eosinophils Relative: 9 % — ABNORMAL HIGH (ref 0–5)
HCT: 35 % — ABNORMAL LOW (ref 36.0–46.0)
MCH: 28.1 pg (ref 26.0–34.0)
MCHC: 32 g/dL (ref 30.0–36.0)
Monocytes Absolute: 0.8 10*3/uL (ref 0.1–1.0)
Monocytes Relative: 11 % (ref 3–12)
Neutrophils Relative %: 64 % (ref 43–77)
RBC: 3.99 MIL/uL (ref 3.87–5.11)
WBC: 7 10*3/uL (ref 4.0–10.5)

## 2012-06-29 MED ORDER — AMOXICILLIN-POT CLAVULANATE 875-125 MG PO TABS: 1.0000 | ORAL_TABLET | Freq: Two times a day (BID) | ORAL | Status: AC

## 2012-06-29 NOTE — Patient Instructions (Addendum)
F/u with Dr Jeanice Lim next week , as before.  Please call if getting worse prior to this.  You have a cough, with low grade fever, so you are being treated for acute bronchitis, with a 7 day course ofaugmentin, and use robitussin 1 teaspoon 3 times daily for the next week.You will get tylenol 325mg  one in the office for the fever and pain in the knee.  Take tylenol 325 mg one twice daily , as needed , for fever and pain, for the next 3 days   You need a CXR and an xray of the left knee.  Bloodwork today, cbc and diff, chem 7  Urine to be collected today and left at the lab for culture today please

## 2012-06-30 LAB — BASIC METABOLIC PANEL
BUN: 30 mg/dL — ABNORMAL HIGH (ref 6–23)
Calcium: 9.4 mg/dL (ref 8.4–10.5)
Glucose, Bld: 98 mg/dL (ref 70–99)
Sodium: 140 mEq/L (ref 135–145)

## 2012-07-01 ENCOUNTER — Other Ambulatory Visit: Payer: Self-pay

## 2012-07-03 DIAGNOSIS — N3000 Acute cystitis without hematuria: Secondary | ICD-10-CM | POA: Insufficient documentation

## 2012-07-03 DIAGNOSIS — M25562 Pain in left knee: Secondary | ICD-10-CM | POA: Insufficient documentation

## 2012-07-03 NOTE — Assessment & Plan Note (Addendum)
Historically, pt has acute infection, robituusin and antibiotics, also tylenol, as needed, for pain and fever, max 2 daily cXR and labs today

## 2012-07-03 NOTE — Assessment & Plan Note (Signed)
Recent trauma to left knee with reduced mobility, needs xray, pt refused to go to the hospuital, and unable to do with mobile unit

## 2012-07-03 NOTE — Assessment & Plan Note (Signed)
Abnormal urinalysis, will follow up on c/s, augmentin prescribed

## 2012-07-03 NOTE — Assessment & Plan Note (Signed)
Controlled, no change in medication  

## 2012-07-03 NOTE — Progress Notes (Signed)
  Subjective:    Patient ID: [name], female    DOB: 10-01-1920, 78 y.o.   MRN: [id number]  HPI Pt in with a h/o sliding to the floor 4 days ago, since then unwilling to fully weight bear, and c/o increased left knee pain. This is a recurrent/chronic problem, she receives in home physical therapy and in home 24 hour nursing care. 3 day h/o increased chest congestion and cough, not much sputum production, also low grade fever. Malodorous urine noted, and pt has a h/o recurrent uTI    Review of Systems See HPI Denies chest pains, palpitations and leg swelling Denies abdominal pain, nausea, vomiting,diarrhea or constipation.   . Denies headaches, seizures, numbness, or tingling. Denies depression, anxiety or insomnia. Denies skin break down or rash.        Objective:   Physical Exam  Elderly female sitting up in no obvious cardiopulmonary distress, alert and pleasant HEENT: No facial asymmetry, EOMI, no sinus tenderness,  oropharynx pink and moist.  Neck decreased ROM no adenopathy.  Chest: decreased air entry throughout, few basilar crackles, no wheezes  CVS: S1, S2 no murmurs, no S3.  ABD: Soft non tender. Bowel sounds normal.  Ext: No edema  MS: decreased  ROM spine,  and knees.  Skin: Intact, no ulcerations  noted.  Psych: Good eye contact, normal affect, not anxious or depressed appearing.  CNS: CN 2-12 intact, power,  normal throughout.       Assessment & Plan:

## 2012-07-04 ENCOUNTER — Ambulatory Visit: Payer: PRIVATE HEALTH INSURANCE | Admitting: Family Medicine

## 2012-07-05 ENCOUNTER — Telehealth: Payer: Self-pay | Admitting: Family Medicine

## 2012-07-05 NOTE — Telephone Encounter (Signed)
I spoke with pt, she feels very fatigued but overall doing a little better, still has antibiotics to complete.  Her oxygen was 89% ambulating with PT, after recheck with [facility], her Oxygen would skip from 97% to 89 but would stay on average about 92-94%. She was not SOB at rest and did not have CP Currently on augmentin for UTI/Bronchitis, labs showed CRI Will repeat Xray in AM/ [facility]- [name] to go back to home to check on her in AM Pt feels comfortable currently, has 24 hour care

## 2012-07-05 NOTE — Telephone Encounter (Signed)
Patient rescheduled appt for yesterday?

## 2012-07-06 ENCOUNTER — Telehealth: Payer: Self-pay | Admitting: Family Medicine

## 2012-07-06 MED ORDER — LEVOFLOXACIN 750 MG PO TABS: 750.0000 mg | ORAL_TABLET | Freq: Every day | ORAL | Status: AC

## 2012-07-06 NOTE — Telephone Encounter (Signed)
Pt seen by [facility] today, I spoke to pt, we were never able to get in contact with a nurse. Her Oxygen sat today was 94-96%, she has been up to bathroom a few times today, and feels a little SOB when walking but otherwise feels she is doing well. No CP, no SOB at rest. +cough, with production, + leg swelling R>l  Completed augmentin for UTI and bronchitis today Repeat CXR shows worsening bronchitis and development of infiltrate, small pleural effusion and interstial edema.  She sounds good via the phone and she think she is improving with exception of cough, she has 24 hour nursing care at home. I will start her on Levaquin for worsening bronchitis/PNA and labile oxygen sats. I will have her take 30mg  of her lasix tomorrow, to help get some fluid down, this will be a very ginger dose, but she has done well with this in the past. Given red flags, I think she is okay to stay in the home with her current care. I will see if I can get incentive spirometry by her [facility] tomorrow

## 2012-07-08 NOTE — Telephone Encounter (Signed)
Spoke with nurse from [facility] she received order for incentive spirometer and noted that patient should increase lasix to 30mg  and start Levaquin.

## 2012-07-11 ENCOUNTER — Other Ambulatory Visit: Payer: Self-pay | Admitting: Family Medicine

## 2012-07-14 ENCOUNTER — Ambulatory Visit: Payer: Medicare Other | Admitting: Family Medicine

## 2012-07-14 ENCOUNTER — Encounter: Payer: Self-pay | Admitting: Family Medicine

## 2012-07-19 ENCOUNTER — Encounter: Payer: Self-pay | Admitting: Family Medicine

## 2012-07-19 ENCOUNTER — Other Ambulatory Visit: Payer: Self-pay | Admitting: Family Medicine

## 2012-07-19 ENCOUNTER — Ambulatory Visit (INDEPENDENT_AMBULATORY_CARE_PROVIDER_SITE_OTHER): Payer: Medicare Other | Admitting: Family Medicine

## 2012-07-19 VITALS — BP 124/76 | HR 64 | Resp 18 | Ht <= 58 in

## 2012-07-19 DIAGNOSIS — J209 Acute bronchitis, unspecified: Secondary | ICD-10-CM

## 2012-07-19 DIAGNOSIS — F341 Dysthymic disorder: Secondary | ICD-10-CM

## 2012-07-19 DIAGNOSIS — I10 Essential (primary) hypertension: Secondary | ICD-10-CM

## 2012-07-19 DIAGNOSIS — E039 Hypothyroidism, unspecified: Secondary | ICD-10-CM

## 2012-07-19 DIAGNOSIS — M62838 Other muscle spasm: Secondary | ICD-10-CM

## 2012-07-19 DIAGNOSIS — R269 Unspecified abnormalities of gait and mobility: Secondary | ICD-10-CM

## 2012-07-19 MED ORDER — ALBUTEROL SULFATE HFA 108 (90 BASE) MCG/ACT IN AERS: 2.0000 | INHALATION_SPRAY | RESPIRATORY_TRACT | Status: DC | PRN

## 2012-07-19 MED ORDER — BENZONATATE 100 MG PO CAPS: 100.0000 mg | ORAL_CAPSULE | Freq: Three times a day (TID) | ORAL | Status: DC | PRN

## 2012-07-19 NOTE — Progress Notes (Signed)
  Subjective:    Patient ID: [name], female    DOB: December 21, 1919, 78 y.o.   MRN: [id number]  HPI Pt here to f/u cough and chronic medical problems.  The patient was treated for mild pneumonia status post bronchitis treatment a few weeks ago. She completed antibiotics last week. She had improved however her nurse aide had a viral illness and she began  coughing again the past few days. She has mild production and has been using Mucinex. She denies chest pain or shortness of breath however they have heard her wheezing at nighttime. She's now able to get up to the rest room with minimal nursing assistance and is using her walker.  She has an appointment with wound clinic tomorrow for the chronic ulcer on her left ankle which is much improved. Her appetite has been good and she's not having difficulty with her bowels at this time. There's been no episodes of confusion . She did bring by her living will which she had signed with her son   Review of Systems - per above   GEN- denies fatigue, fever, weight loss,weakness, recent illness HEENT- denies eye drainage, change in vision, nasal discharge, CVS- denies chest pain, palpitations RESP- + SOB, +cough, +wheeze ABD- denies N/V, change in stools, abd pain GU- denies dysuria, hematuria, dribbling, incontinence MSK- denies joint pain, muscle aches, injury Neuro- denies headache, dizziness, syncope, seizure activity      Objective:   Physical Exam  GEN- NAD, alert and oriented x 3, sitting in wheelchair  HEENT- strabismus of left eye, MMM, PERRL, EOMI,  Neck- supple- no LAD CVS- RRR, no murmur RESP- bialteral wheeze, normal WOB at rest, no retractions,no rhonchi ABD-NABS, soft, NT ND, no CVA tenderness  Ext- chronic ulcer on left ankle- healing   Edema -1+ edema lower ext R> L         Assessment & Plan:   I spoke with patient today. Her breathing is much improved and she is doing well. Her home health nurse was unable to labs  therefore she will come in next week to have these done at the lab.    she is full code. Her living will states that in the event that she might be vegetative or her life will be prolonged on a ventilator machine at that time she would like to withdraw care.

## 2012-07-19 NOTE — Patient Instructions (Addendum)
[facility] to draw blood  Tessalon perrles for cough, inhaler for wheezing Continue all other medications  I will call Thursday to check on you F/U 4 months

## 2012-07-22 ENCOUNTER — Encounter: Payer: Self-pay | Admitting: Family Medicine

## 2012-07-22 LAB — TSH: TSH: 0.29 u[IU]/mL — AB (ref <=5.90)

## 2012-07-22 LAB — BASIC METABOLIC PANEL: Creatinine: 1.5 mg/dL — AB (ref <=1.1)

## 2012-07-22 NOTE — Assessment & Plan Note (Signed)
Mood is well, in setting or recent events and being homebound for quite some time, continue effexor

## 2012-07-22 NOTE — Assessment & Plan Note (Signed)
Improved with neurontin, use of low dose flexeril, her mobility is improving , we may be able to get her off this medication soon

## 2012-07-22 NOTE — Assessment & Plan Note (Signed)
Well controlled, no change to meds 

## 2012-07-22 NOTE — Assessment & Plan Note (Signed)
Will send for repeat TFT

## 2012-07-22 NOTE — Assessment & Plan Note (Signed)
Improving but still requires use of assistive device and help around the home

## 2012-07-22 NOTE — Assessment & Plan Note (Signed)
She status post Levaquin treatment for infiltrates noted a community-acquired pneumonia. She overall is doing well although her lung exam is concerning. I will start her on an albuterol inhaler as well as Tessalon Perles for cough. I will check on her in 48 hours to see she is improved. She has no fever and no shortness of breath. If needed will restart antibiotics and prednisone

## 2012-07-26 ENCOUNTER — Telehealth: Payer: Self-pay | Admitting: Family Medicine

## 2012-07-26 MED ORDER — LEVOTHYROXINE SODIUM 50 MCG PO TABS: 50.0000 ug | ORAL_TABLET | Freq: Every day | ORAL | Status: DC

## 2012-07-26 NOTE — Telephone Encounter (Signed)
Please call pt and let her know, blood work shows she is getting too much thyroid hormone, I will decrease the dose. Her kidney function is at her baseline but she has chronic kidney disease which is not new.  I will recheck her thyroid in 8 weeks, I will have [facility] do this

## 2012-07-27 NOTE — Telephone Encounter (Signed)
Patient aware.

## 2012-08-01 ENCOUNTER — Other Ambulatory Visit: Payer: Self-pay | Admitting: Family Medicine

## 2012-08-02 ENCOUNTER — Ambulatory Visit (INDEPENDENT_AMBULATORY_CARE_PROVIDER_SITE_OTHER): Payer: Medicare Other

## 2012-08-02 DIAGNOSIS — Z23 Encounter for immunization: Secondary | ICD-10-CM

## 2012-08-08 ENCOUNTER — Other Ambulatory Visit: Payer: Self-pay | Admitting: Family Medicine

## 2012-08-22 ENCOUNTER — Other Ambulatory Visit: Payer: Self-pay | Admitting: Family Medicine

## 2012-09-05 ENCOUNTER — Other Ambulatory Visit: Payer: Self-pay | Admitting: Family Medicine

## 2012-09-29 ENCOUNTER — Other Ambulatory Visit: Payer: Self-pay | Admitting: Family Medicine

## 2012-09-29 ENCOUNTER — Telehealth (INDEPENDENT_AMBULATORY_CARE_PROVIDER_SITE_OTHER): Payer: PRIVATE HEALTH INSURANCE

## 2012-09-29 DIAGNOSIS — N3000 Acute cystitis without hematuria: Secondary | ICD-10-CM

## 2012-09-29 LAB — POCT URINALYSIS DIPSTICK
Nitrite, UA: NEGATIVE
Spec Grav, UA: 1.015
Urobilinogen, UA: 0.2
pH, UA: 7

## 2012-09-29 MED ORDER — AMOXICILLIN-POT CLAVULANATE 875-125 MG PO TABS: 1.0000 | ORAL_TABLET | Freq: Two times a day (BID) | ORAL | Status: DC

## 2012-09-29 NOTE — Telephone Encounter (Signed)
Can her caregiver collect urine and bring this in for UA , I will f/u on that result

## 2012-09-29 NOTE — Telephone Encounter (Signed)
Caregiver in to collect.

## 2012-09-29 NOTE — Telephone Encounter (Signed)
Patient called in with UTI symptoms, dysuria, urinary frequency x a couple days. Tried to schedule her but she said she had no way to get here. I told her that Dr Jeanice Lim wouldn't be back until Monday. (This is the pt Dr Jeanice Lim did a home visit on)

## 2012-09-29 NOTE — Telephone Encounter (Signed)
Patient states that she will have someone to come by the office to pickup specimen cup.

## 2012-09-30 ENCOUNTER — Other Ambulatory Visit: Payer: Self-pay

## 2012-09-30 MED ORDER — AMOXICILLIN-POT CLAVULANATE 875-125 MG PO TABS: 1.0000 | ORAL_TABLET | Freq: Two times a day (BID) | ORAL | Status: AC

## 2012-10-02 LAB — URINE CULTURE: Colony Count: >=100000

## 2012-10-04 ENCOUNTER — Other Ambulatory Visit: Payer: Self-pay | Admitting: Family Medicine

## 2012-10-04 NOTE — Telephone Encounter (Signed)
Pt doing better, had some constipation, this relieved with prune juice, advised to use stool softener  Given red flags, continue augmentin

## 2012-11-03 ENCOUNTER — Other Ambulatory Visit: Payer: Self-pay | Admitting: Family Medicine

## 2012-11-14 ENCOUNTER — Ambulatory Visit (INDEPENDENT_AMBULATORY_CARE_PROVIDER_SITE_OTHER): Payer: Medicare Other | Admitting: Family Medicine

## 2012-11-14 ENCOUNTER — Encounter: Payer: Self-pay | Admitting: Family Medicine

## 2012-11-14 VITALS — BP 130/62 | HR 64 | Resp 16 | Ht <= 58 in | Wt 117.0 lb

## 2012-11-14 DIAGNOSIS — F341 Dysthymic disorder: Secondary | ICD-10-CM

## 2012-11-14 DIAGNOSIS — N183 Chronic kidney disease, stage 3 unspecified: Secondary | ICD-10-CM

## 2012-11-14 DIAGNOSIS — I10 Essential (primary) hypertension: Secondary | ICD-10-CM

## 2012-11-14 DIAGNOSIS — K219 Gastro-esophageal reflux disease without esophagitis: Secondary | ICD-10-CM

## 2012-11-14 DIAGNOSIS — T148XXA Other injury of unspecified body region, initial encounter: Secondary | ICD-10-CM

## 2012-11-14 DIAGNOSIS — E039 Hypothyroidism, unspecified: Secondary | ICD-10-CM

## 2012-11-14 NOTE — Progress Notes (Signed)
  Subjective:    Patient ID: [name], female    DOB: [birth year], 79 y.o.   MRN: [id number]  HPI  Pt here for chronic medical problems, fall 1 week ago, resutled in abrasion to bilateral legs, seen by wound care  + heartburn after eating past couple of months, has tried TUMS this has not helped  Review of Systems - per above    GEN- denies fatigue, fever, weight loss,weakness, recent illness HEENT- denies eye drainage, change in vision, nasal discharge, CVS- denies chest pain, palpitations RESP- denies SOB, cough, wheeze ABD- denies N/V, change in stools, abd pain GU- denies dysuria, hematuria, dribbling, incontinence MSK- denies joint pain, muscle aches, injury Neuro- denies headache, dizziness, syncope, seizure activity      Objective:   Physical Exam GEN- NAD, alert and oriented x 3, sitting in wheelchair  HEENT- strabismus of left eye, MMM, PERRL, EOMI,  Neck- supple- no LAD CVS- RRR, no murmur RESP- CTAB  ABD-NABS, soft, NT ND,  Ext- chronic ulcer on left ankle- healing , abrasion left shin, right shin, abrasion with resolving hematoma, venous statis changes, edema R>l  Edema -1+ edema lower ext R> L          Assessment & Plan:

## 2012-11-14 NOTE — Patient Instructions (Addendum)
Try zantac 75mg  for the heart burn once a day Continue all other medications Stop the flexeril Get the labs done for your thyroid F/U 4 months

## 2012-11-15 ENCOUNTER — Other Ambulatory Visit: Payer: Self-pay | Admitting: Family Medicine

## 2012-11-15 LAB — BASIC METABOLIC PANEL
BUN: 27 mg/dL — ABNORMAL HIGH (ref 6–23)
CO2: 32 mEq/L (ref 19–32)
Chloride: 105 mEq/L (ref 96–112)
Creat: 1.41 mg/dL — ABNORMAL HIGH (ref 0.50–1.10)

## 2012-11-15 LAB — CBC
HCT: 39.1 % (ref 36.0–46.0)
MCV: 93.8 fL (ref 78.0–100.0)
RBC: 4.17 MIL/uL (ref 3.87–5.11)
WBC: 7 10*3/uL (ref 4.0–10.5)

## 2012-11-17 DIAGNOSIS — N183 Chronic kidney disease, stage 3 unspecified: Secondary | ICD-10-CM | POA: Insufficient documentation

## 2012-11-17 NOTE — Assessment & Plan Note (Signed)
She is being followed by wound care

## 2012-11-17 NOTE — Assessment & Plan Note (Signed)
Start zantac at bedtime

## 2012-11-17 NOTE — Assessment & Plan Note (Signed)
Repeat TFT within normal limits

## 2012-11-17 NOTE — Assessment & Plan Note (Signed)
Well controlled 

## 2012-11-17 NOTE — Assessment & Plan Note (Signed)
She unfortunately can not afford 24 hour care any more and is home alone, does not want any help, uses [facility] Continue current medications

## 2012-11-21 ENCOUNTER — Telehealth (INDEPENDENT_AMBULATORY_CARE_PROVIDER_SITE_OTHER): Payer: PRIVATE HEALTH INSURANCE | Admitting: Family Medicine

## 2012-11-21 DIAGNOSIS — N3000 Acute cystitis without hematuria: Secondary | ICD-10-CM

## 2012-11-21 MED ORDER — CEPHALEXIN 500 MG PO CAPS: 500.0000 mg | ORAL_CAPSULE | Freq: Four times a day (QID) | ORAL | Status: AC

## 2012-11-21 NOTE — Telephone Encounter (Signed)
Yes, specimen can be brought in Start cranberry juice

## 2012-11-21 NOTE — Telephone Encounter (Signed)
Tried calling pt back, phone busy, keflex QID x 5 days sent

## 2012-11-21 NOTE — Telephone Encounter (Signed)
Spoke with wendy (caregiver) and she states that patient is having urinary frequency, urgency, and odor to urine.  Would like to know if urine specimen can be brought in to office.

## 2012-11-21 NOTE — Telephone Encounter (Signed)
Caregiver will bring in specimen

## 2012-11-21 NOTE — Telephone Encounter (Signed)
Called and left message for patient to return call.  

## 2012-11-22 LAB — POCT URINALYSIS DIPSTICK
Protein, UA: NEGATIVE
Spec Grav, UA: 1.02
Urobilinogen, UA: 0.2

## 2012-11-22 NOTE — Addendum Note (Signed)
Addended by: Kandis Fantasia B on: 11/22/2012 09:42 AM   Modules accepted: Orders

## 2012-11-23 NOTE — Telephone Encounter (Signed)
noted 

## 2012-11-23 NOTE — Telephone Encounter (Signed)
Called patient to verify that she received rx for Keflex.  She stated that she had not.  Called pharmacy to verify that rx had been received and it had.  Pharm tech stated that they would get rx delivered to patient.

## 2012-11-24 LAB — URINE CULTURE

## 2012-12-05 ENCOUNTER — Other Ambulatory Visit: Payer: Self-pay | Admitting: Family Medicine

## 2012-12-06 ENCOUNTER — Ambulatory Visit: Payer: PRIVATE HEALTH INSURANCE | Admitting: Cardiology

## 2012-12-15 ENCOUNTER — Other Ambulatory Visit: Payer: Self-pay | Admitting: Family Medicine

## 2012-12-20 ENCOUNTER — Ambulatory Visit (INDEPENDENT_AMBULATORY_CARE_PROVIDER_SITE_OTHER): Payer: Medicare Other | Admitting: Cardiology

## 2012-12-20 ENCOUNTER — Encounter: Payer: Self-pay | Admitting: Cardiology

## 2012-12-20 VITALS — BP 118/58 | HR 58 | Ht <= 58 in | Wt 114.0 lb

## 2012-12-20 DIAGNOSIS — I4891 Unspecified atrial fibrillation: Secondary | ICD-10-CM

## 2012-12-20 DIAGNOSIS — I2510 Atherosclerotic heart disease of native coronary artery without angina pectoris: Secondary | ICD-10-CM

## 2012-12-20 DIAGNOSIS — I10 Essential (primary) hypertension: Secondary | ICD-10-CM

## 2012-12-20 DIAGNOSIS — N183 Chronic kidney disease, stage 3 unspecified: Secondary | ICD-10-CM

## 2012-12-20 NOTE — Progress Notes (Signed)
HPI [name] today for evaluation and management of paroxysmal A. fib. She is not anticoagulation candidate because of multiple falls. She is very serious fall last July 1. It took her about 3 months to get over it.  She denies any palpitations, chest pain, shortness of breath, edema, presyncope or syncope. She still lives alone but has a caretaker that checks on her daily.  Recent blood work shows normal TSH on amiodarone. She has not had LFTs I can see in over 6 months.  Past Medical History  Diagnosis Date  . Hypertension   . Thyroid disease   . Depression   . Cataract   . Osteoporosis   . IBS (irritable bowel syndrome)   . Anemia   . Hearing loss   . Bilateral leg edema   . Stasis ulcer of lower extremity     followed by wound care  . Peripheral neuropathy     Current Outpatient Prescriptions  Medication Sig Dispense Refill  . albuterol (PROVENTIL HFA;VENTOLIN HFA) 108 (90 BASE) MCG/ACT inhaler Inhale 2 puffs into the lungs every 4 (four) hours as needed for wheezing.  1 Inhaler  1  . amiodarone (PACERONE) 200 MG tablet TAKE (1/2) TABLET BY MOUTH ONCE DAILY AS DIRECTED.  15 tablet  2  . aspirin EC 81 MG tablet Take 81 mg by mouth every morning.       . Calcium-Vitamin D (CALTRATE 600 PLUS-VIT D PO) Take 1 tablet by mouth 2 (two) times daily.        . cloNIDine (CATAPRES) 0.2 MG tablet TAKE ONE TABLET DAILY FOR BLOOD PRESSURE.  30 tablet  3  . gabapentin (NEURONTIN) 300 MG capsule TAKE (1) CAPSULE BY MOUTH IN THE MORNING AND (2) CAPSULES AT BEDTIME.  90 capsule  2  . LASIX 20 MG tablet TAKE ONE TABLET DAILY.  30 tablet  6  . levothyroxine (SYNTHROID, LEVOTHROID) 50 MCG tablet TAKE ONE TABLET DAILY.  30 tablet  3  . Multiple Vitamins-Minerals (SENIOR MULTIVITAMIN PLUS) TABS Take 1 tablet by mouth every morning.       Marland Kitchen PLENDIL 10 MG 24 hr tablet TAKE ONE TABLET DAILY.  30 tablet  6  . Polyethyl Glycol-Propyl Glycol (SYSTANE OP) Apply 1-2 drops to eye daily.      . traMADol  (ULTRAM) 50 MG tablet Take 50 mg by mouth every 6 (six) hours as needed.        . venlafaxine (EFFEXOR) 37.5 MG tablet TAKE ONE TABLET DAILY.  30 tablet  6   No current facility-administered medications for this visit.    Allergies  Allergen Reactions  . Acetaminophen-Codeine     REACTION: causes nausea  . Sulfa Antibiotics Other (See Comments)    Makes her 'sick'    Family History  Problem Relation Age of Onset  . Hypertension Mother   . Hypertension Father   . Stroke Father   . Heart disease Brother   . Heart disease Brother     History   Social History  . Marital Status: Widowed    Spouse Name: N/A    Number of Children: N/A  . Years of Education: N/A   Occupational History  . Not on file.   Social History Main Topics  . Smoking status: Never Smoker   . Smokeless tobacco: Not on file  . Alcohol Use: No     Comment: occasional  . Drug Use: No  . Sexually Active: Not on file   Other Topics Concern  .  Not on file   Social History Narrative  . No narrative on file    ROS ALL NEGATIVE EXCEPT THOSE NOTED IN HPI  PE  General Appearance: well developed, well nourished in no acute distress, looks younger than stated age HEENT: symmetrical face, PERRLA, good dentition  Neck: no JVD, thyromegaly, or adenopathy, trachea midline Chest: symmetric without deformity Cardiac: PMI non-displaced, RRR, normal S1, S2, no gallop or murmur Lung: clear to ausculation and percussion Vascular: all pulses full without bruits  Abdominal: nondistended, nontender, good bowel sounds, no HSM, no bruits Extremities: no cyanosis, clubbing or edema, no sign of DVT, no varicosities  Skin: normal color, no rashes Neuro: alert and oriented x 3, non-focal Pysch: normal affect  EKG  Sinus bradycardia, otherwise normal EKG  BMET    Component Value Date/Time   NA 146* 11/14/2012 1425   K 5.4* 11/14/2012 1425   CL 105 11/14/2012 1425   CO2 32 11/14/2012 1425   GLUCOSE 98 11/14/2012  1425   BUN 27* 11/14/2012 1425   BUN 31* 07/22/2012   CREATININE 1.41* 11/14/2012 1425   CREATININE 1.5* 07/22/2012   CREATININE 1.70* 05/19/2011 1833   CALCIUM 10.4 11/14/2012 1425   CALCIUM 9.3 07/18/2007 2347   GFRNONAA 28* 05/19/2011 1833   GFRAA 34* 05/19/2011 1833    Lipid Panel     Component Value Date/Time   CHOL 234* 08/31/2011 1631   TRIG 136 08/31/2011 1631   HDL 73 08/31/2011 1631   CHOLHDL 3.2 08/31/2011 1631   VLDL 27 08/31/2011 1631   LDLCALC 134* 08/31/2011 1631    CBC    Component Value Date/Time   WBC 7.0 11/14/2012 1425   RBC 4.17 11/14/2012 1425   HGB 12.8 11/14/2012 1425   HCT 39.1 11/14/2012 1425   PLT 302 11/14/2012 1425   MCV 93.8 11/14/2012 1425   MCH 30.7 11/14/2012 1425   MCHC 32.7 11/14/2012 1425   RDW 15.7* 11/14/2012 1425   LYMPHSABS 1.1 06/29/2012 1359   MONOABS 0.8 06/29/2012 1359   EOSABS 0.6 06/29/2012 1359   BASOSABS 0.0 06/29/2012 1359

## 2012-12-20 NOTE — Assessment & Plan Note (Signed)
Stable

## 2012-12-20 NOTE — Assessment & Plan Note (Signed)
Maintaining sinus rhythm. We'll check LFTs on amiodarone. Return the office in 6 months.

## 2012-12-20 NOTE — Patient Instructions (Addendum)
LABS:  Liver Function  Your physician wants you to follow-up in: 6 months at [facility].  You will receive a reminder letter in the mail two months in advance. If you don't receive a letter, please call our office to schedule the follow-up appointment.

## 2012-12-23 ENCOUNTER — Encounter: Payer: Self-pay | Admitting: *Deleted

## 2013-01-02 ENCOUNTER — Other Ambulatory Visit: Payer: Self-pay | Admitting: Cardiology

## 2013-01-02 DIAGNOSIS — I4891 Unspecified atrial fibrillation: Secondary | ICD-10-CM

## 2013-01-03 LAB — HEPATIC FUNCTION PANEL
ALT: 13 U/L (ref 0–35)
AST: 23 U/L (ref 0–37)
Bilirubin, Direct: <0.1 mg/dL (ref 0.0–0.3)

## 2013-01-05 ENCOUNTER — Telehealth: Payer: Self-pay | Admitting: Family Medicine

## 2013-01-05 MED ORDER — CEPHALEXIN 500 MG PO CAPS: 500.0000 mg | ORAL_CAPSULE | Freq: Four times a day (QID) | ORAL | Status: AC

## 2013-01-05 NOTE — Telephone Encounter (Signed)
Verbal given for keflex 500mg  qid x 5 days

## 2013-01-05 NOTE — Telephone Encounter (Signed)
Keflex ordered and patient aware.

## 2013-01-11 ENCOUNTER — Other Ambulatory Visit: Payer: Self-pay | Admitting: Family Medicine

## 2013-02-01 ENCOUNTER — Other Ambulatory Visit: Payer: Self-pay | Admitting: Family Medicine

## 2013-02-06 ENCOUNTER — Other Ambulatory Visit: Payer: Self-pay | Admitting: Family Medicine

## 2013-02-07 ENCOUNTER — Other Ambulatory Visit: Payer: Self-pay

## 2013-02-07 ENCOUNTER — Telehealth: Payer: Self-pay | Admitting: Family Medicine

## 2013-02-07 MED ORDER — TRAMADOL HCL 50 MG PO TABS: 50.0000 mg | ORAL_TABLET | Freq: Two times a day (BID) | ORAL | Status: DC | PRN

## 2013-02-07 NOTE — Telephone Encounter (Signed)
Okay to send 50mg  BID, #30

## 2013-02-07 NOTE — Telephone Encounter (Signed)
Wants a refill on her tramadol 40 1 q 6 hrs. She used to get it from her old DR. How many do you want me to send in? [place] and have them delivered

## 2013-02-14 ENCOUNTER — Ambulatory Visit (INDEPENDENT_AMBULATORY_CARE_PROVIDER_SITE_OTHER): Payer: Medicare Other | Admitting: Family Medicine

## 2013-02-14 ENCOUNTER — Encounter: Payer: Self-pay | Admitting: Family Medicine

## 2013-02-14 VITALS — BP 130/68 | HR 64 | Resp 18 | Ht <= 58 in | Wt 114.1 lb

## 2013-02-14 DIAGNOSIS — L821 Other seborrheic keratosis: Secondary | ICD-10-CM

## 2013-02-14 DIAGNOSIS — M719 Bursopathy, unspecified: Secondary | ICD-10-CM

## 2013-02-14 DIAGNOSIS — R300 Dysuria: Secondary | ICD-10-CM | POA: Insufficient documentation

## 2013-02-14 DIAGNOSIS — E875 Hyperkalemia: Secondary | ICD-10-CM

## 2013-02-14 DIAGNOSIS — M75100 Unspecified rotator cuff tear or rupture of unspecified shoulder, not specified as traumatic: Secondary | ICD-10-CM | POA: Insufficient documentation

## 2013-02-14 DIAGNOSIS — M75101 Unspecified rotator cuff tear or rupture of right shoulder, not specified as traumatic: Secondary | ICD-10-CM

## 2013-02-14 DIAGNOSIS — M67919 Unspecified disorder of synovium and tendon, unspecified shoulder: Secondary | ICD-10-CM

## 2013-02-14 DIAGNOSIS — L729 Follicular cyst of the skin and subcutaneous tissue, unspecified: Secondary | ICD-10-CM | POA: Insufficient documentation

## 2013-02-14 DIAGNOSIS — N3000 Acute cystitis without hematuria: Secondary | ICD-10-CM

## 2013-02-14 DIAGNOSIS — L723 Sebaceous cyst: Secondary | ICD-10-CM

## 2013-02-14 LAB — POCT URINALYSIS DIPSTICK
Bilirubin, UA: NEGATIVE
Glucose, UA: NEGATIVE
Ketones, UA: NEGATIVE
Nitrite, UA: POSITIVE

## 2013-02-14 NOTE — Assessment & Plan Note (Signed)
Acute benign appearing cyst on dorsum right hand, will monitor, pt advised not to pick at skin

## 2013-02-14 NOTE — Patient Instructions (Signed)
Continue cranberry juice We will call if antibiotics are needed Don't pick on your skin Aspercreme to your shoulder Potassium check today F/U 4 months

## 2013-02-14 NOTE — Progress Notes (Signed)
  Subjective:    Patient ID: [name], female    DOB: [birth year], 79 y.o.   MRN: [id number]  HPI  Pt here to f/u chronic medical problems. Right shoulder pain for past week, worse after cleaning out some storage in her home. No recent falls. Noted a spot on her hand 2 weeks ago, no pain, no pus, has scab she keeps picking at [facility] odor to urine past week, mild burning, no abd pain, no N/V,+frequency  Review of Systems   GEN- denies fatigue, fever, weight loss,weakness, recent illness HEENT- denies eye drainage, change in vision, nasal discharge, CVS- denies chest pain, palpitations RESP- denies SOB, cough, wheeze ABD- denies N/V, change in stools, abd pain GU- + dysuria, hematuria, dribbling, incontinence MSK- + joint pain, muscle aches, injury Neuro- denies headache, dizziness, syncope, seizure activity     Objective:   Physical Exam GEN- NAD, alert and oriented x 3,  HEENT- strabismus of left eye, MMM, PERRL, EOMI,  Neck- supple- fair ROM CVS- RRR, no murmur RESP- CTAB   ABD- NABS,soft,NT,ND, no CVA tenderness MSK- Normal inspection shoulder, decreased ROM right side compared to left, +empty can, difficulty scratching back, no impingement signs Ext- chronic ulcer on left ankle-with thick scab , venous statis changes, pedal edema R>l Skin- keraotic lesions on legs, back, dorsum right hand tiny cystic lesion with scab on top       Assessment & Plan:

## 2013-02-14 NOTE — Assessment & Plan Note (Signed)
Benign lesions noted on skin

## 2013-02-14 NOTE — Assessment & Plan Note (Signed)
Await culture before treatment, mild dysuria more of odor to urine Increase fluids some

## 2013-02-14 NOTE — Assessment & Plan Note (Signed)
Aspercreme, ultram as needed

## 2013-02-15 LAB — BASIC METABOLIC PANEL
Calcium: 9.9 mg/dL (ref 8.4–10.5)
Glucose, Bld: 97 mg/dL (ref 70–99)
Potassium: 5.2 mEq/L (ref 3.5–5.3)
Sodium: 143 mEq/L (ref 135–145)

## 2013-02-16 LAB — URINE CULTURE: Colony Count: >=100000

## 2013-02-16 MED ORDER — CIPROFLOXACIN HCL 500 MG PO TABS: 500.0000 mg | ORAL_TABLET | Freq: Two times a day (BID) | ORAL | Status: DC

## 2013-02-16 NOTE — Addendum Note (Signed)
Addended by: Milinda Antis F on: 02/16/2013 05:23 PM   Modules accepted: Orders

## 2013-02-17 ENCOUNTER — Other Ambulatory Visit: Payer: Self-pay

## 2013-02-17 MED ORDER — CIPROFLOXACIN HCL 500 MG PO TABS: 500.0000 mg | ORAL_TABLET | Freq: Two times a day (BID) | ORAL | Status: AC

## 2013-02-27 ENCOUNTER — Telehealth: Payer: Self-pay | Admitting: Family Medicine

## 2013-02-28 ENCOUNTER — Other Ambulatory Visit: Payer: Self-pay | Admitting: Family Medicine

## 2013-02-28 NOTE — Telephone Encounter (Signed)
Tried to call back x 2 line busy

## 2013-02-28 NOTE — Telephone Encounter (Signed)
See if she can come in for appointment, if unable have her take 2 lasix during the daytime for the next 3 days Wash heel with anti-bacterial, apply the bacitracin and then apply a bandage ( Non stick preferred)- Tegaderm if she has at home  Or soft gauze with tape Give her one of the tramadol for pain

## 2013-02-28 NOTE — Telephone Encounter (Signed)
Unable to come today- will follow instructions and come in a couple days if no better

## 2013-02-28 NOTE — Telephone Encounter (Signed)
Swelling in both legs, below her knees. (more on the right leg) and her heel scab has opened and causing her a lot of pain. Has been using the bacitracin. Advanced not coming at all now because her wounds had healed so please advise. Can hardly walk on her left foot.

## 2013-03-14 ENCOUNTER — Ambulatory Visit: Payer: Medicare Other | Admitting: Family Medicine

## 2013-03-20 ENCOUNTER — Other Ambulatory Visit: Payer: Self-pay | Admitting: Family Medicine

## 2013-03-28 ENCOUNTER — Other Ambulatory Visit: Payer: Self-pay | Admitting: Family Medicine

## 2013-03-28 NOTE — Telephone Encounter (Signed)
Forwarding to new office  

## 2013-04-06 ENCOUNTER — Other Ambulatory Visit: Payer: Self-pay | Admitting: Family Medicine

## 2013-04-06 NOTE — Telephone Encounter (Signed)
Medication refilled per protocol. 

## 2013-04-13 ENCOUNTER — Other Ambulatory Visit: Payer: Self-pay | Admitting: Family Medicine

## 2013-04-27 ENCOUNTER — Other Ambulatory Visit: Payer: Self-pay | Admitting: Family Medicine

## 2013-04-27 NOTE — Telephone Encounter (Signed)
Ok to refill 

## 2013-04-30 NOTE — Telephone Encounter (Signed)
Approved. # 30 + 0. 

## 2013-05-01 ENCOUNTER — Other Ambulatory Visit: Payer: Self-pay | Admitting: Family Medicine

## 2013-05-30 ENCOUNTER — Ambulatory Visit (INDEPENDENT_AMBULATORY_CARE_PROVIDER_SITE_OTHER): Payer: Medicare Other | Admitting: Family Medicine

## 2013-05-30 ENCOUNTER — Encounter: Payer: Self-pay | Admitting: Family Medicine

## 2013-05-30 VITALS — BP 128/60 | HR 70 | Temp 97.3°F | Resp 18 | Wt 114.0 lb

## 2013-05-30 DIAGNOSIS — G609 Hereditary and idiopathic neuropathy, unspecified: Secondary | ICD-10-CM

## 2013-05-30 DIAGNOSIS — N390 Urinary tract infection, site not specified: Secondary | ICD-10-CM

## 2013-05-30 DIAGNOSIS — T148XXA Other injury of unspecified body region, initial encounter: Secondary | ICD-10-CM

## 2013-05-30 DIAGNOSIS — R29818 Other symptoms and signs involving the nervous system: Secondary | ICD-10-CM

## 2013-05-30 DIAGNOSIS — I10 Essential (primary) hypertension: Secondary | ICD-10-CM

## 2013-05-30 DIAGNOSIS — R296 Repeated falls: Secondary | ICD-10-CM

## 2013-05-30 DIAGNOSIS — IMO0002 Reserved for concepts with insufficient information to code with codable children: Secondary | ICD-10-CM

## 2013-05-30 DIAGNOSIS — E039 Hypothyroidism, unspecified: Secondary | ICD-10-CM

## 2013-05-30 DIAGNOSIS — G629 Polyneuropathy, unspecified: Secondary | ICD-10-CM

## 2013-05-30 LAB — URINALYSIS, ROUTINE W REFLEX MICROSCOPIC
Hgb urine dipstick: NEGATIVE
Leukocytes, UA: NEGATIVE
Protein, ur: NEGATIVE mg/dL
Urobilinogen, UA: 0.2 mg/dL (ref 0.0–1.0)

## 2013-05-30 NOTE — Assessment & Plan Note (Signed)
Wound cleaned at bedside, tegaderm applied , given instructions to change in 3 days Pt has had multiple skin tears  Call for any signs of infection

## 2013-05-30 NOTE — Assessment & Plan Note (Signed)
Unfortunately she and family decided to decrease level of care, now has aide a few times a week, she has a family friend with her today that checks in on her and takes her out and to appointments

## 2013-05-30 NOTE — Assessment & Plan Note (Signed)
Send urine for culture, no true UTI symptoms today, she may need prophylactic antibiotics, though there is a high possibility she is just colonized as well

## 2013-05-30 NOTE — Assessment & Plan Note (Signed)
Well controlled. -continue current meds  

## 2013-05-30 NOTE — Patient Instructions (Signed)
Culture to be obtained We will call with urine results Change the tegaderm on Friday Call if any evidence of infection F/U 4 months

## 2013-05-30 NOTE — Assessment & Plan Note (Signed)
Repeat studies looked good, no change to dose

## 2013-05-30 NOTE — Assessment & Plan Note (Signed)
Tolerating gabapentin, symptoms fairly well controlled

## 2013-05-30 NOTE — Progress Notes (Signed)
  Subjective:    Patient ID: [name], female    DOB: 1920/08/11, 79 y.o.   MRN: [id number]  HPI Patient here to follow chronic medical problems. She fell today rushing to get dress, she tripped and hit her right shin on the door resulted in a skin tear. She has a bandage over this which was applied this morning. She's also noticed a foul odor to her urine which is typically had her urinary tracts infection start. She denies any abdominal pain, fever, dysuria at this time. She's tolerating her medications without any side effects. Her appetite is well She currently has a nurse aide 3 times a week 4 hours a day to assist her. Cut down hours secondary to finances   Review of Systems  GEN- denies fatigue, fever, weight loss,weakness, recent illness HEENT- denies eye drainage, change in vision, nasal discharge, CVS- denies chest pain, palpitations RESP- denies SOB, cough, wheeze ABD- denies N/V, change in stools, abd pain GU- denies dysuria, hematuria, dribbling, incontinence MSK- denies joint pain, muscle aches, injury Neuro- denies headache, dizziness, syncope, seizure activity      Objective:   Physical Exam GEN- NAD, alert and oriented x 3,  HEENT- strabismus of left eye, MMM, PERRL, EOMI, oropharynx clear CVS- RRR, no murmur RESP- CTAB   ABD- NABS,soft,NT,ND, no CVA tenderness GU- normal urethra, Cath specimen taken Ext- venous statis changes, minimal pedal edema,  Skin- Skin tear RIght shin, minimal bleeding        Assessment & Plan:

## 2013-05-31 ENCOUNTER — Other Ambulatory Visit: Payer: Self-pay | Admitting: Family Medicine

## 2013-05-31 LAB — URINE CULTURE
Colony Count: NO GROWTH
Organism ID, Bacteria: NO GROWTH

## 2013-05-31 NOTE — Telephone Encounter (Signed)
Medication refilled per protocol. 

## 2013-06-21 ENCOUNTER — Telehealth: Payer: Self-pay | Admitting: Family Medicine

## 2013-06-21 MED ORDER — CIPROFLOXACIN HCL 500 MG PO TABS: 500.0000 mg | ORAL_TABLET | Freq: Two times a day (BID) | ORAL | Status: DC

## 2013-06-21 NOTE — Telephone Encounter (Signed)
Send Cipro 500mg  1 tablet twice a day for 5 days Start taking if symptoms return of burning or pressure with urination

## 2013-06-21 NOTE — Telephone Encounter (Signed)
.  Patient aware and rx sent to pharm

## 2013-06-21 NOTE — Telephone Encounter (Signed)
Pt called stating that she is having increased urinary frequency and when it first started she had a small burning sensation but that is gone now. I tried to get her to come in today but she does not have transportation to get here and was wondering what she should do about it as she states she was just here about a month ago and it did not show any infection. Please advise.

## 2013-06-27 ENCOUNTER — Telehealth: Payer: Self-pay | Admitting: Family Medicine

## 2013-06-27 NOTE — Telephone Encounter (Signed)
Letter written

## 2013-06-27 NOTE — Telephone Encounter (Signed)
Pt son Epimenio Sarin needs a letter sent to [employer] stating that she is incapable of walking down drive to get her mail needs to have it delivered to her door.

## 2013-07-18 ENCOUNTER — Other Ambulatory Visit: Payer: Self-pay | Admitting: Family Medicine

## 2013-07-25 ENCOUNTER — Telehealth: Payer: Self-pay | Admitting: Family Medicine

## 2013-07-25 NOTE — Telephone Encounter (Signed)
Clerance Lav said that [name] called her office asking if she can get a hearing test done because her hearing is getting worse. She said that if you can send her an order she will be glad to do it. It needs to be for an audiogram with Dx Code 389.18 (sensorineural hearing loss). Her fax number is [phone].

## 2013-07-25 NOTE — Telephone Encounter (Signed)
Okay to order at pt request

## 2013-07-26 NOTE — Telephone Encounter (Signed)
Order faxed to [facility] at pt request

## 2013-07-31 ENCOUNTER — Other Ambulatory Visit: Payer: Self-pay | Admitting: Family Medicine

## 2013-08-15 ENCOUNTER — Other Ambulatory Visit: Payer: Self-pay | Admitting: Family Medicine

## 2013-08-15 NOTE — Telephone Encounter (Signed)
Medication refilled per protocol. 

## 2013-08-18 ENCOUNTER — Other Ambulatory Visit (HOSPITAL_COMMUNITY): Payer: Self-pay | Admitting: Podiatry

## 2013-08-18 DIAGNOSIS — R52 Pain, unspecified: Secondary | ICD-10-CM

## 2013-08-23 ENCOUNTER — Ambulatory Visit (HOSPITAL_COMMUNITY)
Admission: RE | Admit: 2013-08-23 | Discharge: 2013-08-23 | Disposition: A | Discharge status: Home / Self Care | Payer: Medicare Other | Source: Ambulatory Visit | Attending: Podiatry | Admitting: Podiatry

## 2013-08-23 DIAGNOSIS — X58XXXA Exposure to other specified factors, initial encounter: Secondary | ICD-10-CM | POA: Insufficient documentation

## 2013-08-23 DIAGNOSIS — S8010XA Contusion of unspecified lower leg, initial encounter: Secondary | ICD-10-CM | POA: Insufficient documentation

## 2013-08-23 DIAGNOSIS — M79609 Pain in unspecified limb: Secondary | ICD-10-CM | POA: Insufficient documentation

## 2013-08-23 DIAGNOSIS — R52 Pain, unspecified: Secondary | ICD-10-CM

## 2013-09-05 ENCOUNTER — Other Ambulatory Visit: Payer: Self-pay | Admitting: Family Medicine

## 2013-09-14 ENCOUNTER — Ambulatory Visit (INDEPENDENT_AMBULATORY_CARE_PROVIDER_SITE_OTHER): Payer: Medicare Other | Admitting: Internal Medicine

## 2013-09-14 ENCOUNTER — Encounter: Payer: Self-pay | Admitting: Internal Medicine

## 2013-09-14 VITALS — BP 128/58 | HR 58 | Ht <= 58 in | Wt 106.4 lb

## 2013-09-14 DIAGNOSIS — E785 Hyperlipidemia, unspecified: Secondary | ICD-10-CM

## 2013-09-14 DIAGNOSIS — I4891 Unspecified atrial fibrillation: Secondary | ICD-10-CM

## 2013-09-14 DIAGNOSIS — I10 Essential (primary) hypertension: Secondary | ICD-10-CM

## 2013-09-14 DIAGNOSIS — I739 Peripheral vascular disease, unspecified: Secondary | ICD-10-CM | POA: Insufficient documentation

## 2013-09-14 NOTE — Progress Notes (Signed)
OFFICE NOTE  Chief Complaint:  Nonhealing ulcer, claudication  Primary Care Physician: Tammy Antis, MD  HPI:  [name] is a pleasant 79 year old female with a long past medical history significant for atrial fibrillation in the past. She may have actually seen Dr. Isabel [name] who used to be with our practice. Nevertheless she's been maintained by her primary on amiodarone for some time. In addition she has hypothyroidism, neuropathy, and most likely peripheral arterial disease. She's been having problems with a nonhealing ulcer on her right great toe. This is painless however she does report pain in her calves especially when walking. She is able to go short distances around her house however longer distances does cause her claudication symptoms which improve with rest. She doesn't had swelling and several skin tears to her legs. She denies any chest pain or worsening shortness of breath. She underwent Dopplers at [name] which showed no evidence of DVT. There was however evidence for noncompressible vessels in the right therefore ABI could not be calculated. There were dampened monophasic waveforms. The left ABI was 1.19.  PMHx:  Past Medical History  Diagnosis Date  . Hypertension   . Thyroid disease   . Depression   . Cataract   . Osteoporosis   . IBS (irritable bowel syndrome)   . Anemia   . Hearing loss   . Bilateral leg edema   . Stasis ulcer of lower extremity     followed by wound care  . Peripheral neuropathy     Past Surgical History  Procedure Laterality Date  . Back surgery x2    . Neck surgery    . Hip surgery    . Eye surgery      CATARACT REMOVAL  . Abdominal hysterectomy      FAMHx:  Family History  Problem Relation Age of Onset  . Hypertension Mother   . Hypertension Father   . Stroke Father   . Heart disease Brother   . Heart disease Brother     SOCHx:   reports that she has never smoked. She does not have any smokeless  tobacco history on file. She reports that she does not drink alcohol or use illicit drugs.  ALLERGIES:  Allergies  Allergen Reactions  . Acetaminophen-Codeine     REACTION: causes nausea  . Sulfa Antibiotics Other (See Comments)    Makes her 'sick'    ROS: A comprehensive review of systems was negative except for: poor circulation, claudication, toe ulcer  HOME MEDS: Current Outpatient Prescriptions  Medication Sig Dispense Refill  . amiodarone (PACERONE) 200 MG tablet TAKE (1/2) TABLET BY MOUTH ONCE DAILY AS DIRECTED.  15 tablet  1  . aspirin EC 81 MG tablet Take 81 mg by mouth every morning.       . Calcium-Vitamin D (CALTRATE 600 PLUS-VIT D PO) Take 1 tablet by mouth 2 (two) times daily.        . cloNIDine (CATAPRES) 0.2 MG tablet TAKE ONE TABLET BY MOUTH ONCE DAILY.  30 tablet  3  . felodipine (PLENDIL) 10 MG 24 hr tablet TAKE ONE TABLET DAILY.  30 tablet  3  . furosemide (LASIX) 20 MG tablet TAKE ONE TABLET DAILY.  30 tablet  5  . gabapentin (NEURONTIN) 300 MG capsule TAKE (1) CAPSULE BY MOUTH IN THE MORNING AND (2) CAPSULES AT BEDTIME.  90 capsule  6  . levothyroxine (SYNTHROID, LEVOTHROID) 50 MCG tablet TAKE ONE TABLET DAILY.  30 tablet  3  .  Multiple Vitamins-Minerals (SENIOR MULTIVITAMIN PLUS) TABS Take 1 tablet by mouth every morning.       . mupirocin ointment (BACTROBAN) 2 % Apply 1 application topically daily. Applies to toe      . Polyethyl Glycol-Propyl Glycol (SYSTANE OP) Apply 1-2 drops to eye daily.      . traMADol (ULTRAM) 50 MG tablet TAKE (1) TABLET BY MOUTH TWICE A DAY AS NEEDED.  30 tablet  3  . triamcinolone cream (KENALOG) 0.1 % daily.      Marland Kitchen venlafaxine (EFFEXOR) 37.5 MG tablet TAKE ONE TABLET DAILY.  30 tablet  5   No current facility-administered medications for this visit.    LABS/IMAGING: No results found for this or any previous visit (from the past 48 hour(s)). No results found.  VITALS: BP 128/58  Pulse 58  Ht 4\' 10"  (1.473 m)  Wt 106 lb 6.4  oz (48.263 kg)  BMI 22.24 kg/m2  EXAM: General appearance: alert and no distress Neck: no carotid bruit and no JVD Lungs: clear to auscultation bilaterally Heart: regular rate and rhythm, S1, S2 normal, no murmur, click, rub or gallop Abdomen: soft, non-tender; bowel sounds normal; no masses,  no organomegaly Extremities: varicose veins noted, venous stasis dermatitis noted and ulcer on the right great toe Pulses: difficult to palpate PT/DP Skin: Chronic hemosiderin discoloration of the legs Neurologic: Grossly normal Psych: Mood, affect normal  EKG: Sinus bradycardia at 58  ASSESSMENT: 1. Peripheral arterial disease with dampened, monophasic waveforms in the right leg and preserved ABI in the left leg 2. Atrial fibrillation on amiodarone (managed by PCP) 3. Anticoagulation with aspirin 4. Claudication  PLAN: 1.   [name] is having claudication, which may or may not be lifestyle limiting. It is certainly worse when she does significant exertion. She does have a arterial ulcer on the right great toe which has been slow to heal. I discussed the case with Dr. Allyson [name] who is our expert of peripheral arterial disease. He would recommend a repeat arterial Dopplers and like to see her in the office in 2 weeks. She may ultimately need peripheral angiography. In addition it will be recommended that her amiodarone and atrial fibrillation being managed by a cardiologist and he may wish to do that.  Thank you again for the referral.  Tammy Nose, MD, [name] Attending Cardiologist [facility]  [name],Tammy C 09/14/2013, 4:50 PM

## 2013-09-14 NOTE — Patient Instructions (Addendum)
Your physician recommends that you schedule a follow-up appointment in: 2 weeks with Dr. [name].  Your physician has requested that you have a lower extremity arterial duplex. This test is an ultrasound of the arteries in the legs. It looks at arterial blood flow in the legs. Allow one hour for Lower Arterial scans. There are no restrictions or special instructions

## 2013-09-15 ENCOUNTER — Encounter: Payer: Self-pay | Admitting: Internal Medicine

## 2013-09-18 ENCOUNTER — Ambulatory Visit (HOSPITAL_COMMUNITY)
Admission: RE | Admit: 2013-09-18 | Discharge: 2013-09-18 | Disposition: A | Discharge status: Home / Self Care | Payer: Medicare Other | Source: Ambulatory Visit | Attending: Cardiology | Admitting: Cardiology

## 2013-09-18 DIAGNOSIS — I70209 Unspecified atherosclerosis of native arteries of extremities, unspecified extremity: Secondary | ICD-10-CM | POA: Insufficient documentation

## 2013-09-18 DIAGNOSIS — I70219 Atherosclerosis of native arteries of extremities with intermittent claudication, unspecified extremity: Secondary | ICD-10-CM | POA: Insufficient documentation

## 2013-09-18 DIAGNOSIS — I739 Peripheral vascular disease, unspecified: Secondary | ICD-10-CM

## 2013-09-18 DIAGNOSIS — I743 Embolism and thrombosis of arteries of the lower extremities: Secondary | ICD-10-CM | POA: Insufficient documentation

## 2013-09-18 NOTE — Progress Notes (Signed)
Arterial Duplex Completed. Aryanne Gilleland, BS, RDMS, RVT  

## 2013-09-27 ENCOUNTER — Ambulatory Visit (INDEPENDENT_AMBULATORY_CARE_PROVIDER_SITE_OTHER): Payer: Medicare Other | Admitting: Family Medicine

## 2013-09-27 ENCOUNTER — Encounter: Payer: Self-pay | Admitting: Family Medicine

## 2013-09-27 VITALS — BP 110/68 | HR 78 | Temp 97.4°F | Resp 18 | Wt 106.0 lb

## 2013-09-27 DIAGNOSIS — I10 Essential (primary) hypertension: Secondary | ICD-10-CM

## 2013-09-27 DIAGNOSIS — I739 Peripheral vascular disease, unspecified: Secondary | ICD-10-CM

## 2013-09-27 DIAGNOSIS — L97509 Non-pressure chronic ulcer of other part of unspecified foot with unspecified severity: Secondary | ICD-10-CM

## 2013-09-27 DIAGNOSIS — E039 Hypothyroidism, unspecified: Secondary | ICD-10-CM

## 2013-09-27 DIAGNOSIS — I4891 Unspecified atrial fibrillation: Secondary | ICD-10-CM

## 2013-09-27 DIAGNOSIS — L97519 Non-pressure chronic ulcer of other part of right foot with unspecified severity: Secondary | ICD-10-CM

## 2013-09-27 DIAGNOSIS — N183 Chronic kidney disease, stage 3 unspecified: Secondary | ICD-10-CM

## 2013-09-27 LAB — CBC WITH DIFFERENTIAL/PLATELET
Basophils Absolute: 0 10*3/uL (ref 0.0–0.1)
Eosinophils Absolute: 0.1 10*3/uL (ref 0.0–0.7)
Eosinophils Relative: 1 % (ref 0–5)
Lymphocytes Relative: 26 % (ref 12–46)
Lymphs Abs: 1.6 10*3/uL (ref 0.7–4.0)
MCH: 31.2 pg (ref 26.0–34.0)
MCV: 92.4 fL (ref 78.0–100.0)
Neutro Abs: 4.1 10*3/uL (ref 1.7–7.7)
Platelets: 281 10*3/uL (ref 150–400)
RDW: 15.5 % (ref 11.5–15.5)
WBC: 6.2 10*3/uL (ref 4.0–10.5)

## 2013-09-27 LAB — COMPREHENSIVE METABOLIC PANEL
ALT: 16 U/L (ref 0–35)
Albumin: 4.3 g/dL (ref 3.5–5.2)
Alkaline Phosphatase: 101 U/L (ref 39–117)
CO2: 30 mEq/L (ref 19–32)
Chloride: 100 mEq/L (ref 96–112)
Potassium: 4.3 mEq/L (ref 3.5–5.3)
Sodium: 139 mEq/L (ref 135–145)
Total Bilirubin: 0.3 mg/dL (ref 0.3–1.2)
Total Protein: 6.7 g/dL (ref 6.0–8.3)

## 2013-09-27 LAB — T3, FREE: T3, Free: 2.2 pg/mL — ABNORMAL LOW (ref 2.3–4.2)

## 2013-09-27 MED ORDER — AMLODIPINE BESYLATE 5 MG PO TABS: 5.0000 mg | ORAL_TABLET | Freq: Every day | ORAL | Status: AC

## 2013-09-27 NOTE — Patient Instructions (Signed)
Felodipine will be discontinued  Start norvasc 5mg  when you run out of felodipine COntinue all other medications We will call with lab results  F/U 4 months

## 2013-09-29 ENCOUNTER — Encounter: Payer: Self-pay | Admitting: Family Medicine

## 2013-09-29 DIAGNOSIS — L97509 Non-pressure chronic ulcer of other part of unspecified foot with unspecified severity: Secondary | ICD-10-CM | POA: Insufficient documentation

## 2013-09-29 NOTE — Assessment & Plan Note (Signed)
Recheck renal function. ?

## 2013-09-29 NOTE — Progress Notes (Signed)
   Subjective:    Patient ID: [name], female    DOB: [birth year], 79 y.o.   MRN: [id number]  HPI   Patient here to follow chronic medical problems. She was recently evaluated by cardiology secondary to prolonged healing toe ulcer which was being managed by podiatry. She recently had arterial studies done. She has a followup on the 18th to see if anything can be done but due to her age this is less likely. There was a question about her amiodarone however the cardiologist did not note she has been seen by cardiology in [place] regarding amiodarone and which they decided to continue her on this. She has no specific concerns today. She is a new caregiver who is there with her few hours a day.  Hypertension-her felodipine is no longer cover. She needs to be switched to amlodipine  Medications reviewed      Review of Systems  GEN- denies fatigue, fever, weight loss,weakness, recent illness HEENT- denies eye drainage, change in vision, nasal discharge, CVS- denies chest pain, palpitations RESP- denies SOB, cough, wheeze MSK- denies joint pain, muscle aches, injury Neuro- denies headache, dizziness, syncope, seizure activity      Objective:   Physical Exam GEN- NAD, alert and oriented x 3,  HEENT- strabismus of left eye, MMM, PERRL, EOMI, oropharynx clear CVS- RRR, no murmur RESP- CTAB   ABD- NABS,soft,NT,ND, no CVA tenderness GU- normal urethra, Cath specimen taken Ext- venous statis changes, mild pedal edema, Right foot- great toe ulcer, no drainage Pulses- Radial, DP decreased bilat        Assessment & Plan:

## 2013-09-29 NOTE — Assessment & Plan Note (Addendum)
Blood pressure is well-controlled  I will change her from felodipine to amlodipine due to her insurance coverage, I will also decrease this to 5mg 

## 2013-09-29 NOTE — Assessment & Plan Note (Addendum)
I will recheck her thyroid studies she's currently on levothyroxine, she is also on amiodarone

## 2013-09-29 NOTE — Assessment & Plan Note (Signed)
She's not had any difficulty with her atrial fibrillation in many years.. She was seen by cardiology twice this year which I referred her to with regards for need to continue the amiodarone. Her last visit was back in March and she missed her followup appointment which would have been around September. At that time she was told to continue the amiodarone. She is currently down to a half a tablet a day. I do not see any reason for her to continue the amiodarone however I will defer this to cardiology.

## 2013-09-29 NOTE — Assessment & Plan Note (Signed)
She will f/u with cardiology. Based on her age they may not be much intervention that can be done. She continues to have a slow healing ulcer

## 2013-09-29 NOTE — Assessment & Plan Note (Signed)
Continue f/u with podiatry.  

## 2013-10-04 ENCOUNTER — Ambulatory Visit: Payer: Medicare Other | Admitting: Cardiovascular Disease

## 2013-10-12 ENCOUNTER — Ambulatory Visit (INDEPENDENT_AMBULATORY_CARE_PROVIDER_SITE_OTHER): Payer: Medicare Other | Admitting: Cardiovascular Disease

## 2013-10-12 ENCOUNTER — Encounter: Payer: Self-pay | Admitting: Cardiovascular Disease

## 2013-10-12 VITALS — BP 120/56 | HR 68 | Ht <= 58 in | Wt 106.2 lb

## 2013-10-12 DIAGNOSIS — I739 Peripheral vascular disease, unspecified: Secondary | ICD-10-CM

## 2013-10-12 NOTE — Assessment & Plan Note (Signed)
The patient was referred here by Dr. Theola Sequin, podiatrist in retail, and she originally saw Dr. [place] healthy. She has a poorly healing small ischemic appearing ulcer on her right great toe. Lower extremity arterial Dopplers were performed in our office 09/18/13 which revealed a right ABI of 0.31 with an occluded right SFA and one vessel runoff via the dorsalis pedis. She'll left ABI 0.69 with an occluded distal left SFA. She walks with the aid of a walker and is minimally ambulatory. At this point, given her age and frailty I am inclined to follow her clinically and I recommend an invasive approach unless her ulcer gets worse and she is at risk for loss of limb. I will see her back in one month.

## 2013-10-12 NOTE — Progress Notes (Signed)
10/12/2013 [name]   Feb 03, 1920  [id number]  Primary Physician Milinda Antis, MD Primary Cardiologist: Runell Gess MD Roseanne Reno    HPI:  [name] is a pleasant 79 year old female with a long past medical history significant for atrial fibrillation in the past. She may have actually seen Dr. Isabel Caprice who used to be with our practice. Nevertheless she's been maintained by her primary on amiodarone for some time. In addition she has hypothyroidism, neuropathy, and most likely peripheral arterial disease. She's been having problems with a nonhealing ulcer on her right great toe. This is painless however she does report pain in her calves especially when walking. She is able to go short distances around her house however longer distances does cause her claudication symptoms which improve with rest. She doesn't had swelling and several skin tears to her legs. She denies any chest pain or worsening shortness of breath. She underwent Dopplers at [facility] which showed no evidence of DVT. There was however evidence for noncompressible vessels in the right therefore ABI could not be calculated. There were dampened monophasic waveforms. The left ABI was 1.19.  The patient had lower extremity arterial Doppler studies performed in our office 09/18/13 revealing a right ABI of 0.31, left of 0.69. Her right SFA was occluded and she had one vessel runoff via the dorsalis pedis. Just a small ischemic appearing ulcer on her right great toe which is slowly healing. Dr. Theola Sequin, her podiatrist, is aggressively treating this locally. I suspect that we should reserve any more aggressive interventional approach for failure to heal and potential limb salvage.    Current Outpatient Prescriptions  Medication Sig Dispense Refill  . amiodarone (PACERONE) 200 MG tablet TAKE (1/2) TABLET BY MOUTH ONCE DAILY AS DIRECTED.  15 tablet  1  . amLODipine (NORVASC) 5 MG tablet  Take 1 tablet (5 mg total) by mouth daily.  30 tablet  6  . aspirin EC 81 MG tablet Take 81 mg by mouth every morning.       . Calcium-Vitamin D (CALTRATE 600 PLUS-VIT D PO) Take 1 tablet by mouth 2 (two) times daily.        . cloNIDine (CATAPRES) 0.2 MG tablet TAKE ONE TABLET BY MOUTH ONCE DAILY.  30 tablet  3  . felodipine (PLENDIL) 10 MG 24 hr tablet TAKE ONE TABLET DAILY.  30 tablet  3  . gabapentin (NEURONTIN) 300 MG capsule TAKE (1) CAPSULE BY MOUTH IN THE MORNING AND (2) CAPSULES AT BEDTIME.  90 capsule  6  . levothyroxine (SYNTHROID, LEVOTHROID) 50 MCG tablet TAKE ONE TABLET DAILY.  30 tablet  3  . Multiple Vitamins-Minerals (SENIOR MULTIVITAMIN PLUS) TABS Take 1 tablet by mouth every morning.       . mupirocin ointment (BACTROBAN) 2 % Apply 1 application topically daily. Applies to toe      . Polyethyl Glycol-Propyl Glycol (SYSTANE OP) Apply 1-2 drops to eye daily.      . traMADol (ULTRAM) 50 MG tablet TAKE (1) TABLET BY MOUTH TWICE A DAY AS NEEDED.  30 tablet  3  . triamcinolone cream (KENALOG) 0.1 % daily.      Marland Kitchen venlafaxine (EFFEXOR) 37.5 MG tablet TAKE ONE TABLET DAILY.  30 tablet  5   No current facility-administered medications for this visit.    Allergies  Allergen Reactions  . Acetaminophen-Codeine     REACTION: causes nausea  . Sulfa Antibiotics Other (See Comments)    Makes  her 'sick'    History   Social History  . Marital Status: Widowed    Spouse Name: N/A    Number of Children: N/A  . Years of Education: N/A   Occupational History  . Not on file.   Social History Main Topics  . Smoking status: Never Smoker   . Smokeless tobacco: Not on file  . Alcohol Use: No     Comment: occasional  . Drug Use: No  . Sexual Activity: Not on file   Other Topics Concern  . Not on file   Social History Narrative  . No narrative on file     Review of Systems: General: negative for chills, fever, night sweats or weight changes.  Cardiovascular: negative for chest  pain, dyspnea on exertion, edema, orthopnea, palpitations, paroxysmal nocturnal dyspnea or shortness of breath Dermatological: negative for rash Respiratory: negative for cough or wheezing Urologic: negative for hematuria Abdominal: negative for nausea, vomiting, diarrhea, bright red blood per rectum, melena, or hematemesis Neurologic: negative for visual changes, syncope, or dizziness All other systems reviewed and are otherwise negative except as noted above.    Blood pressure 120/56, pulse 68, height 4\' 10"  (1.473 m), weight 106 lb 3.2 oz (48.172 kg).  General appearance: alert and no distress Neck: no adenopathy, no carotid bruit, no JVD, supple, symmetrical, trachea midline and thyroid not enlarged, symmetric, no tenderness/mass/nodules Lungs: clear to auscultation bilaterally Heart: regular rate and rhythm, S1, S2 normal, no murmur, click, rub or gallop Extremities: she has venous stasis changes on both legs and a nonhealing ulcer on the dorsum aspect of her  right great toe  EKG not performed today  ASSESSMENT AND PLAN:   PAD (peripheral artery disease) The patient was referred here by Dr. Theola Sequin, podiatrist in [place], and she originally saw Dr. [place] healthy. She has a poorly healing small ischemic appearing ulcer on her right great toe. Lower extremity arterial Dopplers were performed in our office 09/18/13 which revealed a right ABI of 0.31 with an occluded right SFA and one vessel runoff via the dorsalis pedis. She'll left ABI 0.69 with an occluded distal left SFA. She walks with the aid of a walker and is minimally ambulatory. At this point, given her age and frailty I am inclined to follow her clinically and I recommend an invasive approach unless her ulcer gets worse and she is at risk for loss of limb. I will see her back in one month.       Runell Gess MD FACP,FACC,FAHA, [facility] 10/12/2013 1:07 PM

## 2013-10-12 NOTE — Patient Instructions (Signed)
Your physician recommends that you schedule a follow-up appointment in: 1 month with Dr [name]

## 2013-10-23 ENCOUNTER — Telehealth: Payer: Self-pay | Admitting: Family Medicine

## 2013-10-23 DIAGNOSIS — R0781 Pleurodynia: Secondary | ICD-10-CM

## 2013-10-23 NOTE — Telephone Encounter (Signed)
Pt is hurting on her left side/rib area that radiates to her back. She states that she has back pain anyway but the pain in the rib area concerns her. Xray of ribs ordered and she will go by [facility] and have xray done tomorrow.

## 2013-10-23 NOTE — Telephone Encounter (Signed)
Okay to get XRAY of ribs- need to know what side and where she has pain Her aide can take her to [facility] to get it   Dx- Fall, Rib pain

## 2013-10-23 NOTE — Telephone Encounter (Signed)
noted 

## 2013-10-23 NOTE — Telephone Encounter (Signed)
Pt states she fell a couple weeks ago she did not break anything but she is having pain near her ribcage she is wanting to know what she should do to get an xray  Call back number is [phone]

## 2013-10-24 ENCOUNTER — Ambulatory Visit (HOSPITAL_COMMUNITY)
Admission: RE | Admit: 2013-10-24 | Discharge: 2013-10-24 | Disposition: A | Discharge status: Home / Self Care | Payer: Medicare Other | Source: Ambulatory Visit | Attending: Family Medicine | Admitting: Family Medicine

## 2013-10-24 DIAGNOSIS — R079 Chest pain, unspecified: Secondary | ICD-10-CM | POA: Insufficient documentation

## 2013-10-24 DIAGNOSIS — W19XXXA Unspecified fall, initial encounter: Secondary | ICD-10-CM | POA: Insufficient documentation

## 2013-10-24 DIAGNOSIS — R0781 Pleurodynia: Secondary | ICD-10-CM

## 2013-10-25 NOTE — Telephone Encounter (Signed)
Pt is aware of her x-ray report and will call back in a week if no better.

## 2013-11-10 ENCOUNTER — Ambulatory Visit: Payer: Medicare Other | Admitting: Cardiovascular Disease

## 2013-11-13 ENCOUNTER — Other Ambulatory Visit: Payer: Self-pay | Admitting: Family Medicine

## 2013-11-13 NOTE — Telephone Encounter (Signed)
Meds refilled.

## 2013-12-26 ENCOUNTER — Other Ambulatory Visit: Payer: Self-pay | Admitting: Family Medicine

## 2013-12-26 NOTE — Telephone Encounter (Signed)
Refill appropriate and filled per protocol. 

## 2014-01-09 ENCOUNTER — Other Ambulatory Visit: Payer: Self-pay | Admitting: Family Medicine

## 2014-01-09 NOTE — Telephone Encounter (Signed)
Medication refilled per protocol. 

## 2014-01-26 ENCOUNTER — Ambulatory Visit: Payer: Medicare Other | Admitting: Family Medicine

## 2014-01-30 ENCOUNTER — Ambulatory Visit (INDEPENDENT_AMBULATORY_CARE_PROVIDER_SITE_OTHER): Payer: Medicare Other | Admitting: Family Medicine

## 2014-01-30 ENCOUNTER — Encounter: Payer: Self-pay | Admitting: Family Medicine

## 2014-01-30 VITALS — BP 110/62 | HR 82 | Temp 97.3°F | Resp 14 | Ht 59.0 in | Wt 106.0 lb

## 2014-01-30 DIAGNOSIS — I739 Peripheral vascular disease, unspecified: Secondary | ICD-10-CM

## 2014-01-30 DIAGNOSIS — I10 Essential (primary) hypertension: Secondary | ICD-10-CM

## 2014-01-30 DIAGNOSIS — R296 Repeated falls: Secondary | ICD-10-CM

## 2014-01-30 DIAGNOSIS — L97509 Non-pressure chronic ulcer of other part of unspecified foot with unspecified severity: Secondary | ICD-10-CM

## 2014-01-30 DIAGNOSIS — Z23 Encounter for immunization: Secondary | ICD-10-CM

## 2014-01-30 DIAGNOSIS — R29818 Other symptoms and signs involving the nervous system: Secondary | ICD-10-CM

## 2014-01-30 NOTE — Assessment & Plan Note (Signed)
The right great toe is healing well and has a nice scab on it. She also has a small ulceration on the lateral side of her left sole which is also stable. Also noted a little bit of mild drainage from where her a clip her toenails on the third digit no ear edema no actual abscess. Advised to use the iodine ointment that was given to her by the wound Center on this as well.

## 2014-01-30 NOTE — Assessment & Plan Note (Signed)
BP on low normal side, D/C Clonidine

## 2014-01-30 NOTE — Assessment & Plan Note (Signed)
Previous toe ulcer looks good, no further ulcerations,

## 2014-01-30 NOTE — Patient Instructions (Signed)
Stop CLONIDINE  Use the cream on your toe as well Prevnar 13 pneumonia shot given Continue all other medications F/U 6 months

## 2014-01-30 NOTE — Assessment & Plan Note (Signed)
Recent falls, she only has help during the day, declines 24 hour nursing again which she had, has life alert and neighbors to check on her

## 2014-01-30 NOTE — Progress Notes (Signed)
Patient ID: [name], female   DOB: [birth year], 80 y.o.   MRN: [id number]   Subjective:    Patient ID: [name], female    DOB: [birth year], 80 y.o.   MRN: [id number]  Patient presents for 4 month F/U   is here follow chronic medical problems. She's been doing well overall. She did have 2 falls in December she is here with her age today who spends about 3-4 hours with her during the day. Her falls have been mostly in the evening time she did have one around 2 AM where she had called the neighbors after pushing her life alert. She states that she was working at her desk in the chair moved from underneath her. She's been taken off her medications as prescribed we did stop the amiodarone at the last visit. She continues to have leg pain and foot pain due to her severe peripheral arterial disease as well as venous insufficiency. She rarely takes the tramadol but does take the gabapentin which he thinks helps.  She still being followed by the wound Center for her toe ulcer which is now stable she notices once a month and she is using a topical iodine on it. She is eating fairly well. Her weight has been stable She is due for Prevnar 13    Review Of Systems:  GEN- denies fatigue, fever, weight loss,weakness, recent illness HEENT- denies eye drainage, change in vision, nasal discharge, CVS- denies chest pain, palpitations RESP- denies SOB, cough, wheeze ABD- denies N/V, change in stools, abd pain GU- denies dysuria, hematuria, dribbling, incontinence MSK- denies joint pain, muscle aches, injury Neuro- denies headache, dizziness, syncope, seizure activity       Objective:    BP 110/62  Pulse 82  Temp(Src) 97.3 F (36.3 C) (Oral)  Resp 14  Ht 4\' 11"  (1.499 m)  Wt 106 lb (48.081 kg)  BMI 21.40 kg/m2 GEN- NAD, alert and oriented x 3,  HEENT- strabismus of left eye, MMM, PERRL, EOMI, oropharynx clear CVS- RRR, no murmur RESP- CTAB   ABD- NABS,soft,NT,ND, no CVA  tenderness Ext- venous statis changes, mild pedal edema, Right foot- great toe ulcer with scab, no erythema, 3rd toe- above nail clear drainage noted, no fluctance, NT, scabbed ulceration lateral aspect of right sole Pulses- Radial, DP decreased bilat      Assessment & Plan:      Problem List Items Addressed This Visit   Toe ulcer     The right great toe is healing well and has a nice scab on it. She also has a small ulceration on the lateral side of her left sole which is also stable. Also noted a little bit of mild drainage from where her a clip her toenails on the third digit no ear edema no actual abscess. Advised to use the iodine ointment that was given to her by the wound Center on this as well.    Repeated falls     Recent falls, she only has help during the day, declines 24 hour nursing again which she had, has life alert and neighbors to check on her     PAD (peripheral artery disease)     Previous toe ulcer looks good, no further ulcerations,     HYPERTENSION     BP on low normal side, D/C Clonidine     Other Visit Diagnoses   Need for prophylactic vaccination against Streptococcus pneumoniae (pneumococcus)    -  Primary    Relevant Orders  Pneumococcal conjugate vaccine 13-valent (Completed)       Note: This dictation was prepared with Dragon dictation along with smaller phrase technology. Any transcriptional errors that result from this process are unintentional.

## 2014-02-12 ENCOUNTER — Telehealth: Payer: Self-pay | Admitting: *Deleted

## 2014-02-12 MED ORDER — CEPHALEXIN 500 MG PO CAPS: 500.0000 mg | ORAL_CAPSULE | Freq: Four times a day (QID) | ORAL | Status: DC

## 2014-02-12 NOTE — Telephone Encounter (Signed)
Prescription sent to pharmacy. .   Call placed to patient and patient made aware.  

## 2014-02-12 NOTE — Telephone Encounter (Signed)
Returned call to patient.   Reports that for the last 2 days she has had increased frequency of urine. States that she has had UTI before and this was the only symptom she exhibited.   States that urine color is not pale, but not dark either. Reports that urine has slight foul odor, but no pain noted.   Requested to have medication called in for her.   MD please advise.

## 2014-02-12 NOTE — Telephone Encounter (Signed)
Send keflex 500mg  QID x 5 days Have her Aide bring by a sample if she does not get better

## 2014-02-12 NOTE — Telephone Encounter (Signed)
Message copied by Phillips OdorSIX, CHRISTINA H on Mon Feb 12, 2014  3:20 PM ------      Message from: Malvin JohnsBULLINS, SUSAN S      Created: Mon Feb 12, 2014  3:03 PM       [phone]  Patient would like us to call her  regarding a possible bladder infection  ------

## 2014-02-15 ENCOUNTER — Other Ambulatory Visit: Payer: Self-pay | Admitting: Family Medicine

## 2014-02-15 NOTE — Telephone Encounter (Signed)
Refill appropriate and filled per protocol. 

## 2014-02-26 ENCOUNTER — Encounter: Payer: Self-pay | Admitting: Family Medicine

## 2014-02-26 ENCOUNTER — Ambulatory Visit (INDEPENDENT_AMBULATORY_CARE_PROVIDER_SITE_OTHER): Payer: Medicare Other | Admitting: Family Medicine

## 2014-02-26 VITALS — BP 138/76 | HR 64 | Temp 97.5°F | Resp 16 | Ht 59.0 in | Wt 103.0 lb

## 2014-02-26 DIAGNOSIS — N3946 Mixed incontinence: Secondary | ICD-10-CM

## 2014-02-26 DIAGNOSIS — I739 Peripheral vascular disease, unspecified: Secondary | ICD-10-CM

## 2014-02-26 DIAGNOSIS — L97509 Non-pressure chronic ulcer of other part of unspecified foot with unspecified severity: Secondary | ICD-10-CM

## 2014-02-26 DIAGNOSIS — L608 Other nail disorders: Secondary | ICD-10-CM

## 2014-02-26 DIAGNOSIS — L601 Onycholysis: Secondary | ICD-10-CM

## 2014-02-26 DIAGNOSIS — R350 Frequency of micturition: Secondary | ICD-10-CM

## 2014-02-26 DIAGNOSIS — M159 Polyosteoarthritis, unspecified: Secondary | ICD-10-CM

## 2014-02-26 LAB — URINALYSIS, ROUTINE W REFLEX MICROSCOPIC
BILIRUBIN URINE: NEGATIVE
Glucose, UA: NEGATIVE mg/dL
HGB URINE DIPSTICK: NEGATIVE
KETONES UR: NEGATIVE mg/dL
Leukocytes, UA: NEGATIVE
Nitrite: NEGATIVE
PROTEIN: NEGATIVE mg/dL
Specific Gravity, Urine: 1.015 (ref 1.005–1.030)
UROBILINOGEN UA: 0.2 mg/dL (ref 0.0–1.0)
pH: 7 (ref 5.0–8.0)

## 2014-02-26 MED ORDER — TRAMADOL HCL 50 MG PO TABS: ORAL_TABLET | ORAL | Status: DC

## 2014-02-26 NOTE — Assessment & Plan Note (Signed)
No signs of superficial infection Continue wound care

## 2014-02-26 NOTE — Patient Instructions (Addendum)
Continue current medications Use Depends Keep nail clean Use Ultram twice a day as needed  F/U as previous

## 2014-02-26 NOTE — Assessment & Plan Note (Signed)
Discussed diagnosis, no signs of UTI, I think medications would be more harmful due to SE , she lives alone, anti-cholinergic effects would cause more issues Start depends

## 2014-02-26 NOTE — Assessment & Plan Note (Signed)
Continue f/u for ulcer, based on age, there is not much that can be done for her PAD Ultram for pain

## 2014-02-26 NOTE — Progress Notes (Signed)
Patient ID: [name], female   DOB: Jul 15, 1920, 80 y.o.   MRN: [id number]   Subjective:    Patient ID: [name], female    DOB: Jul 15, 1920, 80 y.o.   MRN: [id number]  Patient presents for 2 month F/U and Urinary Frequency  patient here for acute visit for urinary frequency. She was actually treated with Keflex prophylactically she's had multiple bladder infections. She continues to have some incontinence during the day as well as at night. She does not get up quickly she will leak. She's currently wearing police pads at night she has more difficulty and since the wet through these. She went to have her urine rechecked for infection.  Chronic ulcers or bladder currently being followed by the wound Center. She will like me to recheck these today. She has been having some increased episodes of pains in her knees as well as her legs. She has known peripheral neuropathy as well as peripheral vascular disease. She is taking her gabapentin as prescribed and is doing fairly well with this however she's not using the tramadol. She's not had any recent falls. She's done well off of the clonidine was also discontinued at her last visit.    Review Of Systems:  GEN- denies fatigue, fever, weight loss,weakness, recent illness HEENT- denies eye drainage, change in vision, nasal discharge, CVS- denies chest pain, palpitations RESP- denies SOB, cough, wheeze ABD- denies N/V, change in stools, abd pain GU- denies dysuria, hematuria, dribbling, incontinence MSK- +joint pain, muscle aches, injury Neuro- denies headache, dizziness, syncope, seizure activity       Objective:    BP 138/76  Pulse 64  Temp(Src) 97.5 F (36.4 C) (Oral)  Resp 16  Ht 4\' 11"  (1.499 m)  Wt 103 lb (46.72 kg)  BMI 20.79 kg/m2 GEN- NAD, alert and oriented x 3,  HEENT- strabismus of left eye, MMM, PERRL, EOMI, oropharynx clear CVS- RRR, no murmur RESP- CTAB   ABD- NABS,soft,NT,ND, Ext- venous statis changes, trace  pedal edema, Right foot- great toe ulcer with scab, no erythema, 3rd toe- nail hanging off by edge, clear drainage beneath nail, no fluctance, NT,  scabbed ulceration lateral aspect of right sole Pulses- Radial, DP decreased bilaterally Neuro- decreased sensation LE, dark erythematous hue to bilat feet       Assessment & Plan:      Problem List Items Addressed This Visit   Urinary incontinence, mixed     Discussed diagnosis, no signs of UTI, I think medications would be more harmful due to SE , she lives alone, anti-cholinergic effects would cause more issues Start depends    Toe ulcer     No signs of superficial infection Continue wound care    PAD (peripheral artery disease)     Continue f/u for ulcer, based on age, there is not much that can be done for her PAD Ultram for pain    Onycholysis of toenail     Nail was 90% off, I just pulled remainder off, cleaned at bedside, ointment applied, recent antibiotics Likley due to trauma     Other Visit Diagnoses   Urinary frequency    -  Primary    Relevant Orders       Urinalysis, Routine w reflex microscopic (Completed)       Note: This dictation was prepared with Dragon dictation along with smaller phrase technology. Any transcriptional errors that result from this process are unintentional.

## 2014-02-26 NOTE — Assessment & Plan Note (Signed)
Nail was 90% off, I just pulled remainder off, cleaned at bedside, ointment applied, recent antibiotics Likley due to trauma

## 2014-04-13 ENCOUNTER — Ambulatory Visit: Payer: Medicare Other | Admitting: Family Medicine

## 2014-04-16 ENCOUNTER — Encounter (HOSPITAL_COMMUNITY): Payer: Self-pay | Admitting: Emergency Medicine

## 2014-04-16 ENCOUNTER — Emergency Department (HOSPITAL_COMMUNITY)
Admission: EM | Admit: 2014-04-16 | Discharge: 2014-04-16 | Disposition: A | Discharge status: Home / Self Care | Payer: Medicare Other | Attending: Emergency Medicine | Admitting: Emergency Medicine

## 2014-04-16 ENCOUNTER — Emergency Department (HOSPITAL_COMMUNITY): Payer: Medicare Other

## 2014-04-16 DIAGNOSIS — Y9389 Activity, other specified: Secondary | ICD-10-CM | POA: Insufficient documentation

## 2014-04-16 DIAGNOSIS — Z8719 Personal history of other diseases of the digestive system: Secondary | ICD-10-CM | POA: Insufficient documentation

## 2014-04-16 DIAGNOSIS — S81809A Unspecified open wound, unspecified lower leg, initial encounter: Principal | ICD-10-CM

## 2014-04-16 DIAGNOSIS — Z872 Personal history of diseases of the skin and subcutaneous tissue: Secondary | ICD-10-CM | POA: Insufficient documentation

## 2014-04-16 DIAGNOSIS — IMO0002 Reserved for concepts with insufficient information to code with codable children: Secondary | ICD-10-CM | POA: Insufficient documentation

## 2014-04-16 DIAGNOSIS — Z8669 Personal history of other diseases of the nervous system and sense organs: Secondary | ICD-10-CM | POA: Insufficient documentation

## 2014-04-16 DIAGNOSIS — F329 Major depressive disorder, single episode, unspecified: Secondary | ICD-10-CM | POA: Insufficient documentation

## 2014-04-16 DIAGNOSIS — H9190 Unspecified hearing loss, unspecified ear: Secondary | ICD-10-CM | POA: Insufficient documentation

## 2014-04-16 DIAGNOSIS — M129 Arthropathy, unspecified: Secondary | ICD-10-CM | POA: Insufficient documentation

## 2014-04-16 DIAGNOSIS — F3289 Other specified depressive episodes: Secondary | ICD-10-CM | POA: Insufficient documentation

## 2014-04-16 DIAGNOSIS — Z79899 Other long term (current) drug therapy: Secondary | ICD-10-CM | POA: Insufficient documentation

## 2014-04-16 DIAGNOSIS — S81811A Laceration without foreign body, right lower leg, initial encounter: Secondary | ICD-10-CM

## 2014-04-16 DIAGNOSIS — Z862 Personal history of diseases of the blood and blood-forming organs and certain disorders involving the immune mechanism: Secondary | ICD-10-CM | POA: Insufficient documentation

## 2014-04-16 DIAGNOSIS — Z7982 Long term (current) use of aspirin: Secondary | ICD-10-CM | POA: Insufficient documentation

## 2014-04-16 DIAGNOSIS — W19XXXA Unspecified fall, initial encounter: Secondary | ICD-10-CM

## 2014-04-16 DIAGNOSIS — Y92009 Unspecified place in unspecified non-institutional (private) residence as the place of occurrence of the external cause: Secondary | ICD-10-CM | POA: Insufficient documentation

## 2014-04-16 DIAGNOSIS — W06XXXA Fall from bed, initial encounter: Secondary | ICD-10-CM | POA: Insufficient documentation

## 2014-04-16 DIAGNOSIS — I10 Essential (primary) hypertension: Secondary | ICD-10-CM | POA: Insufficient documentation

## 2014-04-16 DIAGNOSIS — Z23 Encounter for immunization: Secondary | ICD-10-CM | POA: Insufficient documentation

## 2014-04-16 DIAGNOSIS — S91009A Unspecified open wound, unspecified ankle, initial encounter: Principal | ICD-10-CM

## 2014-04-16 DIAGNOSIS — S81009A Unspecified open wound, unspecified knee, initial encounter: Secondary | ICD-10-CM | POA: Insufficient documentation

## 2014-04-16 MED ORDER — TETANUS-DIPHTH-ACELL PERTUSSIS 5-2.5-18.5 LF-MCG/0.5 IM SUSP
0.5000 mL | Freq: Once | INTRAMUSCULAR | Status: AC
  Administered 2014-04-16: 0.5 mL via INTRAMUSCULAR
  Filled 2014-04-16: qty 0.5

## 2014-04-16 MED ORDER — BACITRACIN ZINC 500 UNIT/GM EX OINT
TOPICAL_OINTMENT | CUTANEOUS | Status: AC
  Administered 2014-04-16: 05:00:00
  Filled 2014-04-16: qty 1.8

## 2014-04-16 NOTE — ED Notes (Signed)
Patient presents to ER via [facility] s/p falling out of bed.  Patient is staying at a friend's house and fell out of bed.  Patient with multiple skin tears noted to right leg.

## 2014-04-16 NOTE — ED Provider Notes (Signed)
CSN: [id number]     Arrival date & time 04/16/14  [id number] History   First MD Initiated Contact with Patient 04/16/14 [phone]     Chief Complaint  Patient presents with  . Fall      Patient is a 80 y.o. female presenting with fall. The history is provided by the patient.  Fall This is a new problem. The current episode started less than 1 hour ago. The problem has not changed since onset.Pertinent negatives include no chest pain and no headaches. Nothing aggravates the symptoms. Nothing relieves the symptoms.  pt is staying at friend's house (power is out at her own house) and during the night she slid out of bed and injured her right leg on her walker.  She reports several large skin tears to her leg No head injury from fall.  She reports pain in her right thigh and tibial surface She has no other complaints  Past Medical History  Diagnosis Date  . Hypertension   . Thyroid disease   . Depression   . Cataract   . Osteoporosis   . IBS (irritable bowel syndrome)   . Anemia   . Hearing loss   . Bilateral leg edema   . Stasis ulcer of lower extremity     followed by wound care  . Peripheral neuropathy   . Critical lower limb ischemia   . Arthritis    Past Surgical History  Procedure Laterality Date  . Back surgery x2    . Neck surgery    . Hip surgery    . Eye surgery      CATARACT REMOVAL  . Abdominal hysterectomy     Family History  Problem Relation Age of Onset  . Hypertension Mother   . Hypertension Father   . Stroke Father   . Heart disease Brother   . Heart disease Brother    History  Substance Use Topics  . Smoking status: Never Smoker   . Smokeless tobacco: Never Used  . Alcohol Use: No     Comment: occasional   OB History   Grav Para Term Preterm Abortions TAB SAB Ect Mult Living                 Review of Systems  Cardiovascular: Negative for chest pain.  Neurological: Negative for headaches.  All other systems reviewed and are  negative.     Allergies  Acetaminophen-codeine and Sulfa antibiotics  Home Medications   Prior to Admission medications   Medication Sig Start Date End Date Taking? Authorizing Freyja Govea  amLODipine (NORVASC) 5 MG tablet Take 1 tablet (5 mg total) by mouth daily. 09/27/13   Salley ScarletKawanta F Laurie, MD  aspirin EC 81 MG tablet Take 81 mg by mouth every morning.     Historical Isacc Turney, MD  Calcium-Vitamin D (CALTRATE 600 PLUS-VIT D PO) Take 1 tablet by mouth 2 (two) times daily.      Historical Tremar Wickens, MD  furosemide (LASIX) 20 MG tablet TAKE ONE TABLET DAILY. 12/26/13   Salley ScarletKawanta F [place], MD  gabapentin (NEURONTIN) 300 MG capsule TAKE (1) CAPSULE BY MOUTH IN THE MORNING AND (2) CAPSULES AT BEDTIME. 02/15/14   Salley ScarletKawanta F [place], MD  levothyroxine (SYNTHROID, LEVOTHROID) 50 MCG tablet TAKE ONE TABLET DAILY. 01/09/14   Salley ScarletKawanta F [place], MD  Multiple Vitamins-Minerals (SENIOR MULTIVITAMIN PLUS) TABS Take 1 tablet by mouth every morning.     Historical Ekam Besson, MD  Polyethyl Glycol-Propyl Glycol (SYSTANE OP) Apply 1-2 drops to eye daily.  Historical Candiss Galeana, MD  traMADol (ULTRAM) 50 MG tablet TAKE (1) TABLET BY MOUTH TWICE A DAY AS NEEDED. 02/26/14   Salley ScarletKawanta F [place], MD  triamcinolone cream (KENALOG) 0.1 % daily. 06/15/13   Historical Izel Hochberg, MD  venlafaxine (EFFEXOR) 37.5 MG tablet TAKE ONE TABLET DAILY. 12/26/13   Salley ScarletKawanta F [place], MD   BP 136/77  Pulse 121  Temp(Src) 98.5 F (36.9 C) (Oral)  Resp 20  Ht 4\' 11"  (1.499 m)  Wt 104 lb (47.174 kg)  BMI 20.99 kg/m2  SpO2 96% Physical Exam CONSTITUTIONAL: Well developed/well nourished HEAD: Normocephalic/atraumatic ENMT: Mucous membranes moist NECK: supple no meningeal signs SPINE:entire spine nontender CV: S1/S2 noted, tachycardic LUNGS: Lungs are clear to auscultation bilaterally, no apparent distress ABDOMEN: soft, nontender, no rebound or guarding GU:no cva tenderness NEURO: Pt is awake/alert, moves all extremitiesx4 EXTREMITIES: pulses  normal, full ROM Diffuse tenderness to right thigh and right tibial surface.  No deformity noted to right LE SKIN: warm, color normal. Several skin tears to right LE with largest noted to right mid tibial surface.  There is no bone exposed.  No foreign bodies noted PSYCH: no abnormalities of mood noted  ED Course  Procedures  Imaging negative Pt has no other complaints Nonadherent dressing applied by nursing.  She has PCP f/u in one day.  Advised to leave bandage in place until see by PCP.  Wound not amenable to suturing Repeat HR 89 Imaging Review Dg Femur Right  04/16/2014   CLINICAL DATA:  Right hip pain and distal tibial skin tear after fall out of bed.  EXAM: RIGHT FEMUR - 2 VIEW  COMPARISON:  Right hip 04/26/2008  FINDINGS: Postoperative changes with short intra medullary rod and compression screw fixation of the right proximal femur. Old healed fracture deformity of the inner trochanteric region femoral neck. Periosteal reaction around the distal locking screw. No evidence of acute fracture or dislocation. Distal right femur is unremarkable. Vascular calcifications.  IMPRESSION: Old postoperative/posttraumatic changes in the right hip. No acute fractures demonstrated.   Electronically Signed   By: Burman NievesWilliam  Stevens M.D.   On: 04/16/2014 04:29   Dg Tibia/fibula Right  04/16/2014   CLINICAL DATA:  Right hip pain in skin tear after fall out of bed.  EXAM: RIGHT TIBIA AND FIBULA - 2 VIEW  COMPARISON:  None.  FINDINGS: There is no evidence of fracture or other focal bone lesions. Soft tissues are unremarkable. Vascular calcifications.  IMPRESSION: Negative.   Electronically Signed   By: Burman NievesWilliam  Stevens M.D.   On: 04/16/2014 04:30    MDM   Final diagnoses:  Fall, initial encounter  Noninfected skin tear of right lower extremity, initial encounter    Nursing notes including past medical history and social history reviewed and considered in documentation xrays reviewed and  considered     Joya Gaskinsonald W Wickline, MD 04/16/14 [phone]

## 2014-04-17 ENCOUNTER — Encounter: Payer: Self-pay | Admitting: Family Medicine

## 2014-04-17 ENCOUNTER — Ambulatory Visit (INDEPENDENT_AMBULATORY_CARE_PROVIDER_SITE_OTHER): Payer: Medicare Other | Admitting: Family Medicine

## 2014-04-17 VITALS — BP 136/78 | HR 70 | Temp 98.2°F | Resp 18 | Ht 59.0 in | Wt 103.0 lb

## 2014-04-17 DIAGNOSIS — R296 Repeated falls: Secondary | ICD-10-CM

## 2014-04-17 DIAGNOSIS — R29818 Other symptoms and signs involving the nervous system: Secondary | ICD-10-CM

## 2014-04-17 DIAGNOSIS — N3946 Mixed incontinence: Secondary | ICD-10-CM

## 2014-04-17 DIAGNOSIS — M159 Polyosteoarthritis, unspecified: Secondary | ICD-10-CM

## 2014-04-17 DIAGNOSIS — R269 Unspecified abnormalities of gait and mobility: Secondary | ICD-10-CM

## 2014-04-17 DIAGNOSIS — T148XXA Other injury of unspecified body region, initial encounter: Secondary | ICD-10-CM

## 2014-04-17 NOTE — Assessment & Plan Note (Signed)
Repeated falls and gait instability I will set her up for physical therapy
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
## 2014-04-17 NOTE — Assessment & Plan Note (Signed)
She is very concerned about her urinary pattern does not like the depends very much. I will try her on a low dose of myrtrebiq, which has less anti-cholinergic effects, this is more quality of life and this is something she continues to complain with

## 2014-04-17 NOTE — Patient Instructions (Signed)
Increase tramadol to at least twice a day  Start the bladder pill Myrtrebriq  Wound care Home Health to be set up  Physical therapy to be set up We will call to see how bladder pill is doing in a few weeks F/U as previous

## 2014-04-17 NOTE — Assessment & Plan Note (Addendum)
Multiple skin tears to her right lower leg. Some are quite severe. I will have her set up with home health so that they can attend her wounds ,he also has appointments with the wound care center

## 2014-04-17 NOTE — Assessment & Plan Note (Signed)
Her leg and hip pain are all due to her osteoarthritis she does not want injections I do not think that she needs any surgical intervention at her age we need to try to control her pain better with medications. I will have her increase her tramadol to take it twice a day she can even take one in the middle of day if needed but I doubt she will need this much.

## 2014-04-19 ENCOUNTER — Telehealth: Payer: Self-pay | Admitting: *Deleted

## 2014-04-19 NOTE — Telephone Encounter (Signed)
Message copied by Phillips OdorSIX, CHRISTINA H on Thu Apr 19, 2014 11:03 AM ------      Message from: Malvin JohnsBULLINS, SUSAN S      Created: Thu Apr 19, 2014 10:52 AM       DAWN FROM [facility] CALLING TO SPEAK WITH YOU ABOUT THIS PATIENT PLEASE CALL HER AT       [phone] ------

## 2014-04-19 NOTE — Telephone Encounter (Signed)
[facility]   Reports that patient HR noted elevated (130-140/ hovering). States that patient is not symptomatic, not SOB, and not winded. States that all other VS WNL.   Patient aide also reports that while she was in ER, her HR was elevated at 129.  Reports that Amiodarone was D/C'ed d/t hypotension, but she was wondering if she should resume medication.   Advised that patient has cardiologist and information will be passed to them.   MD to be made aware.

## 2014-04-20 NOTE — Telephone Encounter (Signed)
[facility]. LMTRC.

## 2014-04-20 NOTE — Telephone Encounter (Signed)
See if they were able to speak to her cardiologist, I would not resume the amiodarone unless they agreed I did take her off clonidine a few months back,  Her HR have been normal in the office, in the ED she had just sustained a fall so I would not rely on that one measuraement  They can go back out and spot check her,if her heart rate is still up ,she can go back on Clonidine 0.1mg  at bedtime

## 2014-04-20 NOTE — Telephone Encounter (Signed)
[facility] call and stated that she would have vitals taken again today.

## 2014-04-23 ENCOUNTER — Other Ambulatory Visit: Payer: Self-pay | Admitting: Family Medicine

## 2014-04-23 NOTE — Telephone Encounter (Signed)
Refill appropriate and filled per protocol. 

## 2014-04-24 ENCOUNTER — Telehealth: Payer: Self-pay | Admitting: *Deleted

## 2014-04-24 MED ORDER — CLONIDINE HCL 0.1 MG PO TABS: 0.1000 mg | ORAL_TABLET | Freq: Every day | ORAL | Status: AC

## 2014-04-24 NOTE — Telephone Encounter (Signed)
Received call from [place], [facility].   Reports that patient has been seen by them in regards to wounds to legs. Reports that [facility], but patient is requesting to change to [facility]. Before Wound Center put in referral, she wanted to make sure that it was ok with MD.   Algis DownsAdvised that if patient is requesting [facility] for dressing changes, then that would be ok.   MD to be made aware.

## 2014-04-24 NOTE — Telephone Encounter (Signed)
Call placed to [facility] [phone] and spoke with Britta MccreedyBarbara, case Production designer, theatre/television/filmmanager.   Reports that patient was seen on 04/23/2014, and HR noted 117. Other VS noted WNL. No SOB or distress noted.   Per MD advice, Clonidine 0.1mg  resumed at bedtime. Advised case manager to continue to monitor HR and notify [facility] if it stays >100.  Call placed to patient to make aware. Prescription sent to pharmacy.

## 2014-04-25 ENCOUNTER — Telehealth: Payer: Self-pay | Admitting: *Deleted

## 2014-04-25 ENCOUNTER — Other Ambulatory Visit: Payer: Self-pay | Admitting: *Deleted

## 2014-04-25 NOTE — Telephone Encounter (Signed)
Call returned to patient son, Onalee HuaDavid.   Reports that patient has become increasingly weak and he feels that she can no longer stay at home alone.   Sitter only comes to home x3 hours per day.   Requested to have MD advise.   Advised that he could need to contact SNF or ALF to determine if there is availability for patient. Patient would need someone to stay with her if she cannot walk or care for self.   Requested to have FL2 completed.   FL2 completed and awaiting instructions from patient son.

## 2014-04-26 ENCOUNTER — Emergency Department (HOSPITAL_COMMUNITY): Payer: Medicare Other

## 2014-04-26 ENCOUNTER — Inpatient Hospital Stay (HOSPITAL_COMMUNITY)
Admission: EM | Admit: 2014-04-26 | Discharge: 2014-04-30 | DRG: 309 | Disposition: A | Discharge status: Skilled Nursing Facility | Payer: Medicare Other | Attending: Internal Medicine | Admitting: Internal Medicine

## 2014-04-26 ENCOUNTER — Other Ambulatory Visit: Payer: Self-pay

## 2014-04-26 ENCOUNTER — Encounter (HOSPITAL_COMMUNITY): Payer: Self-pay | Admitting: Emergency Medicine

## 2014-04-26 DIAGNOSIS — Z823 Family history of stroke: Secondary | ICD-10-CM

## 2014-04-26 DIAGNOSIS — Z66 Do not resuscitate: Secondary | ICD-10-CM | POA: Diagnosis present

## 2014-04-26 DIAGNOSIS — R509 Fever, unspecified: Secondary | ICD-10-CM

## 2014-04-26 DIAGNOSIS — L821 Other seborrheic keratosis: Secondary | ICD-10-CM

## 2014-04-26 DIAGNOSIS — T148XXA Other injury of unspecified body region, initial encounter: Secondary | ICD-10-CM

## 2014-04-26 DIAGNOSIS — N3946 Mixed incontinence: Secondary | ICD-10-CM

## 2014-04-26 DIAGNOSIS — I872 Venous insufficiency (chronic) (peripheral): Secondary | ICD-10-CM

## 2014-04-26 DIAGNOSIS — N390 Urinary tract infection, site not specified: Secondary | ICD-10-CM

## 2014-04-26 DIAGNOSIS — E039 Hypothyroidism, unspecified: Secondary | ICD-10-CM

## 2014-04-26 DIAGNOSIS — R0602 Shortness of breath: Secondary | ICD-10-CM

## 2014-04-26 DIAGNOSIS — L03115 Cellulitis of right lower limb: Secondary | ICD-10-CM

## 2014-04-26 DIAGNOSIS — L601 Onycholysis: Secondary | ICD-10-CM

## 2014-04-26 DIAGNOSIS — F341 Dysthymic disorder: Secondary | ICD-10-CM

## 2014-04-26 DIAGNOSIS — H9190 Unspecified hearing loss, unspecified ear: Secondary | ICD-10-CM | POA: Diagnosis present

## 2014-04-26 DIAGNOSIS — L02419 Cutaneous abscess of limb, unspecified: Secondary | ICD-10-CM | POA: Diagnosis present

## 2014-04-26 DIAGNOSIS — H5000 Unspecified esotropia: Secondary | ICD-10-CM

## 2014-04-26 DIAGNOSIS — R296 Repeated falls: Secondary | ICD-10-CM

## 2014-04-26 DIAGNOSIS — E785 Hyperlipidemia, unspecified: Secondary | ICD-10-CM

## 2014-04-26 DIAGNOSIS — I739 Peripheral vascular disease, unspecified: Secondary | ICD-10-CM

## 2014-04-26 DIAGNOSIS — I509 Heart failure, unspecified: Secondary | ICD-10-CM | POA: Diagnosis present

## 2014-04-26 DIAGNOSIS — F329 Major depressive disorder, single episode, unspecified: Secondary | ICD-10-CM | POA: Diagnosis present

## 2014-04-26 DIAGNOSIS — K5900 Constipation, unspecified: Secondary | ICD-10-CM | POA: Diagnosis present

## 2014-04-26 DIAGNOSIS — I129 Hypertensive chronic kidney disease with stage 1 through stage 4 chronic kidney disease, or unspecified chronic kidney disease: Secondary | ICD-10-CM | POA: Diagnosis present

## 2014-04-26 DIAGNOSIS — K589 Irritable bowel syndrome without diarrhea: Secondary | ICD-10-CM | POA: Diagnosis present

## 2014-04-26 DIAGNOSIS — Z7982 Long term (current) use of aspirin: Secondary | ICD-10-CM

## 2014-04-26 DIAGNOSIS — M810 Age-related osteoporosis without current pathological fracture: Secondary | ICD-10-CM

## 2014-04-26 DIAGNOSIS — N183 Chronic kidney disease, stage 3 unspecified: Secondary | ICD-10-CM

## 2014-04-26 DIAGNOSIS — I1310 Hypertensive heart and chronic kidney disease without heart failure, with stage 1 through stage 4 chronic kidney disease, or unspecified chronic kidney disease: Secondary | ICD-10-CM

## 2014-04-26 DIAGNOSIS — G609 Hereditary and idiopathic neuropathy, unspecified: Secondary | ICD-10-CM | POA: Diagnosis present

## 2014-04-26 DIAGNOSIS — I10 Essential (primary) hypertension: Secondary | ICD-10-CM

## 2014-04-26 DIAGNOSIS — M129 Arthropathy, unspecified: Secondary | ICD-10-CM | POA: Diagnosis present

## 2014-04-26 DIAGNOSIS — M159 Polyosteoarthritis, unspecified: Secondary | ICD-10-CM

## 2014-04-26 DIAGNOSIS — I2510 Atherosclerotic heart disease of native coronary artery without angina pectoris: Secondary | ICD-10-CM

## 2014-04-26 DIAGNOSIS — H269 Unspecified cataract: Secondary | ICD-10-CM

## 2014-04-26 DIAGNOSIS — I4891 Unspecified atrial fibrillation: Principal | ICD-10-CM

## 2014-04-26 DIAGNOSIS — L03119 Cellulitis of unspecified part of limb: Secondary | ICD-10-CM

## 2014-04-26 DIAGNOSIS — Z8249 Family history of ischemic heart disease and other diseases of the circulatory system: Secondary | ICD-10-CM

## 2014-04-26 DIAGNOSIS — R269 Unspecified abnormalities of gait and mobility: Secondary | ICD-10-CM

## 2014-04-26 DIAGNOSIS — G629 Polyneuropathy, unspecified: Secondary | ICD-10-CM

## 2014-04-26 DIAGNOSIS — K259 Gastric ulcer, unspecified as acute or chronic, without hemorrhage or perforation: Secondary | ICD-10-CM

## 2014-04-26 DIAGNOSIS — M4800 Spinal stenosis, site unspecified: Secondary | ICD-10-CM

## 2014-04-26 DIAGNOSIS — I5030 Unspecified diastolic (congestive) heart failure: Secondary | ICD-10-CM | POA: Diagnosis present

## 2014-04-26 DIAGNOSIS — M62838 Other muscle spasm: Secondary | ICD-10-CM

## 2014-04-26 DIAGNOSIS — D649 Anemia, unspecified: Secondary | ICD-10-CM

## 2014-04-26 DIAGNOSIS — F3289 Other specified depressive episodes: Secondary | ICD-10-CM | POA: Diagnosis present

## 2014-04-26 DIAGNOSIS — Z79899 Other long term (current) drug therapy: Secondary | ICD-10-CM

## 2014-04-26 DIAGNOSIS — L729 Follicular cyst of the skin and subcutaneous tissue, unspecified: Secondary | ICD-10-CM

## 2014-04-26 DIAGNOSIS — J189 Pneumonia, unspecified organism: Secondary | ICD-10-CM

## 2014-04-26 DIAGNOSIS — E860 Dehydration: Secondary | ICD-10-CM

## 2014-04-26 LAB — RETICULOCYTES
RBC.: 3.27 MIL/uL — ABNORMAL LOW (ref 3.87–5.11)
Retic Count, Absolute: 39.2 10*3/uL (ref 19.0–186.0)
Retic Ct Pct: 1.2 % (ref 0.4–3.1)

## 2014-04-26 LAB — URINALYSIS, ROUTINE W REFLEX MICROSCOPIC
Bilirubin Urine: NEGATIVE
Glucose, UA: NEGATIVE mg/dL
Ketones, ur: NEGATIVE mg/dL
NITRITE: NEGATIVE
PH: 6 (ref 5.0–8.0)
Specific Gravity, Urine: 1.02 (ref 1.005–1.030)
Urobilinogen, UA: 0.2 mg/dL (ref 0.0–1.0)

## 2014-04-26 LAB — CBC WITH DIFFERENTIAL/PLATELET
Basophils Absolute: 0 10*3/uL (ref 0.0–0.1)
Basophils Relative: 0 % (ref 0–1)
Eosinophils Absolute: 0 10*3/uL (ref 0.0–0.7)
Eosinophils Relative: 0 % (ref 0–5)
HCT: 30.6 % — ABNORMAL LOW (ref 36.0–46.0)
Hemoglobin: 10.3 g/dL — ABNORMAL LOW (ref 12.0–15.0)
Lymphocytes Relative: 9 % — ABNORMAL LOW (ref 12–46)
Lymphs Abs: 0.9 10*3/uL (ref 0.7–4.0)
MCH: 30.9 pg (ref 26.0–34.0)
MCHC: 33.7 g/dL (ref 30.0–36.0)
MCV: 91.9 fL (ref 78.0–100.0)
Monocytes Absolute: 0.9 10*3/uL (ref 0.1–1.0)
Monocytes Relative: 9 % (ref 3–12)
Neutro Abs: 8.1 10*3/uL — ABNORMAL HIGH (ref 1.7–7.7)
Neutrophils Relative %: 82 % — ABNORMAL HIGH (ref 43–77)
Platelets: 280 10*3/uL (ref 150–400)
RBC: 3.33 MIL/uL — ABNORMAL LOW (ref 3.87–5.11)
RDW: 14.4 % (ref 11.5–15.5)
WBC: 10 10*3/uL (ref 4.0–10.5)

## 2014-04-26 LAB — TROPONIN I: Troponin I: <0.3 ng/mL (ref <0.30)

## 2014-04-26 LAB — BASIC METABOLIC PANEL
Anion gap: 10 (ref 5–15)
BUN: 24 mg/dL — AB (ref 6–23)
CALCIUM: 8.8 mg/dL (ref 8.4–10.5)
CO2: 28 mEq/L (ref 19–32)
Chloride: 99 mEq/L (ref 96–112)
Creatinine, Ser: 1.21 mg/dL — ABNORMAL HIGH (ref 0.50–1.10)
GFR calc Af Amer: 44 mL/min — ABNORMAL LOW (ref >90)
GFR calc non Af Amer: 38 mL/min — ABNORMAL LOW (ref >90)
GLUCOSE: 128 mg/dL — AB (ref 70–99)
Potassium: 3.9 mEq/L (ref 3.7–5.3)
Sodium: 137 mEq/L (ref 137–147)

## 2014-04-26 LAB — URINE MICROSCOPIC-ADD ON

## 2014-04-26 LAB — PHOSPHORUS: Phosphorus: 3.2 mg/dL (ref 2.3–4.6)

## 2014-04-26 LAB — IRON AND TIBC
Iron: <10 ug/dL — ABNORMAL LOW (ref 42–135)
UIBC: 221 ug/dL (ref 125–400)

## 2014-04-26 LAB — MRSA PCR SCREENING: MRSA by PCR: NEGATIVE

## 2014-04-26 LAB — MAGNESIUM: Magnesium: 1.9 mg/dL (ref 1.5–2.5)

## 2014-04-26 MED ORDER — GABAPENTIN 300 MG PO CAPS
300.0000 mg | ORAL_CAPSULE | Freq: Every day | ORAL | Status: DC
  Administered 2014-04-27 – 2014-04-30 (×4): 300 mg via ORAL
  Filled 2014-04-26 (×4): qty 1

## 2014-04-26 MED ORDER — AMIODARONE LOAD VIA INFUSION
150.0000 mg | Freq: Once | INTRAVENOUS | Status: AC
  Administered 2014-04-26: 150 mg via INTRAVENOUS
  Filled 2014-04-26: qty 83.34

## 2014-04-26 MED ORDER — DEXTROSE 5 % IV SOLN
5.0000 mg/h | INTRAVENOUS | Status: DC
  Filled 2014-04-26: qty 100

## 2014-04-26 MED ORDER — CLONIDINE HCL 0.1 MG PO TABS: 0.1000 mg | ORAL_TABLET | Freq: Every day | ORAL | Status: DC

## 2014-04-26 MED ORDER — DILTIAZEM HCL 100 MG IV SOLR
5.0000 mg/h | INTRAVENOUS | Status: DC
  Administered 2014-04-26: 5 mg/h via INTRAVENOUS
  Filled 2014-04-26: qty 100

## 2014-04-26 MED ORDER — COLLAGENASE 250 UNIT/GM EX OINT
TOPICAL_OINTMENT | Freq: Every day | CUTANEOUS | Status: DC
  Administered 2014-04-26 – 2014-04-30 (×5): via TOPICAL
  Filled 2014-04-26: qty 30

## 2014-04-26 MED ORDER — FUROSEMIDE 20 MG PO TABS
20.0000 mg | ORAL_TABLET | Freq: Every day | ORAL | Status: DC
  Administered 2014-04-27: 20 mg via ORAL
  Filled 2014-04-26: qty 1

## 2014-04-26 MED ORDER — AMIODARONE HCL IN DEXTROSE 360-4.14 MG/200ML-% IV SOLN
60.0000 mg/h | INTRAVENOUS | Status: AC
  Administered 2014-04-26 (×2): 60 mg/h via INTRAVENOUS
  Filled 2014-04-26 (×2): qty 200

## 2014-04-26 MED ORDER — ASPIRIN EC 81 MG PO TBEC
81.0000 mg | DELAYED_RELEASE_TABLET | ORAL | Status: DC
  Administered 2014-04-27 – 2014-04-30 (×5): 81 mg via ORAL
  Filled 2014-04-26 (×5): qty 1

## 2014-04-26 MED ORDER — LEVOTHYROXINE SODIUM 50 MCG PO TABS
50.0000 ug | ORAL_TABLET | Freq: Every day | ORAL | Status: DC
  Administered 2014-04-27 – 2014-04-30 (×4): 50 ug via ORAL
  Filled 2014-04-26 (×2): qty 1
  Filled 2014-04-26 (×2): qty 2

## 2014-04-26 MED ORDER — SODIUM CHLORIDE 0.9 % IJ SOLN: 3.0000 mL | INTRAMUSCULAR | Status: DC | PRN

## 2014-04-26 MED ORDER — ADULT MULTIVITAMIN W/MINERALS CH
1.0000 | ORAL_TABLET | ORAL | Status: DC
  Administered 2014-04-27 – 2014-04-30 (×5): 1 via ORAL
  Filled 2014-04-26 (×5): qty 1

## 2014-04-26 MED ORDER — TRAMADOL HCL 50 MG PO TABS
50.0000 mg | ORAL_TABLET | Freq: Four times a day (QID) | ORAL | Status: DC | PRN
  Administered 2014-04-26 – 2014-04-27 (×2): 50 mg via ORAL
  Filled 2014-04-26 (×3): qty 1

## 2014-04-26 MED ORDER — AMLODIPINE BESYLATE 5 MG PO TABS
5.0000 mg | ORAL_TABLET | Freq: Every day | ORAL | Status: DC
  Administered 2014-04-27 – 2014-04-30 (×4): 5 mg via ORAL
  Filled 2014-04-26 (×4): qty 1

## 2014-04-26 MED ORDER — DILTIAZEM HCL 25 MG/5ML IV SOLN
10.0000 mg | Freq: Once | INTRAVENOUS | Status: AC
  Administered 2014-04-26: 10 mg via INTRAVENOUS

## 2014-04-26 MED ORDER — AMIODARONE HCL IN DEXTROSE 360-4.14 MG/200ML-% IV SOLN
30.0000 mg/h | INTRAVENOUS | Status: DC
  Administered 2014-04-26 – 2014-04-27 (×2): 30 mg/h via INTRAVENOUS
  Filled 2014-04-26 (×6): qty 200

## 2014-04-26 MED ORDER — HEPARIN SODIUM (PORCINE) 5000 UNIT/ML IJ SOLN
5000.0000 [IU] | Freq: Three times a day (TID) | INTRAMUSCULAR | Status: DC
  Administered 2014-04-26 – 2014-04-30 (×11): 5000 [IU] via SUBCUTANEOUS
  Filled 2014-04-26 (×12): qty 1

## 2014-04-26 MED ORDER — SODIUM CHLORIDE 0.9 % IJ SOLN
3.0000 mL | Freq: Two times a day (BID) | INTRAMUSCULAR | Status: DC
  Administered 2014-04-26 – 2014-04-29 (×3): 3 mL via INTRAVENOUS

## 2014-04-26 MED ORDER — DOXYCYCLINE HYCLATE 100 MG PO TABS
100.0000 mg | ORAL_TABLET | Freq: Once | ORAL | Status: AC
  Administered 2014-04-26: 100 mg via ORAL
  Filled 2014-04-26: qty 1

## 2014-04-26 MED ORDER — DOXYCYCLINE HYCLATE 100 MG IV SOLR
100.0000 mg | Freq: Two times a day (BID) | INTRAVENOUS | Status: DC
  Administered 2014-04-26 – 2014-04-28 (×4): 100 mg via INTRAVENOUS
  Filled 2014-04-26 (×8): qty 100

## 2014-04-26 MED ORDER — GABAPENTIN 300 MG PO CAPS
600.0000 mg | ORAL_CAPSULE | Freq: Every day | ORAL | Status: DC
  Administered 2014-04-26 – 2014-04-29 (×4): 600 mg via ORAL
  Filled 2014-04-26 (×4): qty 2

## 2014-04-26 MED ORDER — SODIUM CHLORIDE 0.9 % IV SOLN: 250.0000 mL | INTRAVENOUS | Status: DC | PRN

## 2014-04-26 MED ORDER — VENLAFAXINE HCL 37.5 MG PO TABS
37.5000 mg | ORAL_TABLET | Freq: Every day | ORAL | Status: DC
  Administered 2014-04-27 – 2014-04-30 (×4): 37.5 mg via ORAL
  Filled 2014-04-26 (×4): qty 1

## 2014-04-26 MED ORDER — DILTIAZEM HCL 25 MG/5ML IV SOLN
10.0000 mg | Freq: Once | INTRAVENOUS | Status: AC
  Administered 2014-04-26: 10 mg via INTRAVENOUS
  Filled 2014-04-26: qty 5

## 2014-04-26 MED ORDER — MIRABEGRON ER 25 MG PO TB24
25.0000 mg | ORAL_TABLET | Freq: Every day | ORAL | Status: DC
  Administered 2014-04-27 – 2014-04-30 (×4): 25 mg via ORAL
  Filled 2014-04-26 (×7): qty 1

## 2014-04-26 NOTE — Consult Note (Signed)
Primary cardiologist: Former patient Dr Daleen SquibbWall Consulting cardiologist: Dr Dina RichJonathan Kamareon [name]  Clinical Summary [name] is a 80 y.o.female history of PAF not an anticoag candidate per Dr Anola GurneyWalls last note due to frequent falls (multiple falls by history with most recent 04/16/14), HTN, PAD with right toe ulcer followed by Dr Allyson SabalBerry, DNR per epic charting, admitted with generalized weakness. She denies any chest pain, SOB, or significant palpitations. In ER noted to be in afib with rates in 130s. She had previously been on longterm amio according to Dr Anola GurneyWalls notes, from Dr Hazle CocaBerry's note 10/02/13 patient was on amio 100mg  daily. From telephone note from pcp dated 04/19/14 notes there is report amio was stopped due to  sometime in the last few months, potentially for low blood pressures. Patient started on dilt drip in ER, on 15 currently with rates still in 120s with soft bps with systolics in low 100s.   Admitted with generalized weakness CXR small bilateral effusions without pulm edema, trop neg, K 3.9, Cr 1.2, GFR 38, TSH pending. EKG afib in 130s. ER vitals p 138 bp 124/67     Allergies  Allergen Reactions  . Acetaminophen-Codeine     REACTION: causes nausea  . Sulfa Antibiotics Other (See Comments)    Makes her 'sick'    Medications Scheduled Medications: . amLODipine  5 mg Oral Daily  . [START ON 04/27/2014] aspirin EC  81 mg Oral BH-q7a  . cloNIDine  0.1 mg Oral QHS  . collagenase   Topical Daily  . doxycycline (VIBRAMYCIN) IV  100 mg Intravenous Q12H  . furosemide  20 mg Oral Daily  . gabapentin  300 mg Oral Daily  . gabapentin  600 mg Oral QHS  . heparin  5,000 Units Subcutaneous 3 times per day  . [START ON 04/27/2014] levothyroxine  50 mcg Oral QAC breakfast  . mirabegron ER  25 mg Oral Daily  . [START ON 04/27/2014] multivitamin with minerals  1 tablet Oral BH-q7a  . sodium chloride  3 mL Intravenous Q12H  . venlafaxine  37.5 mg Oral Daily     Infusions: . diltiazem (CARDIZEM)  infusion 5 mg/hr (04/26/14 1228)  . diltiazem (CARDIZEM) infusion 10 mg/hr (04/26/14 1420)     PRN Medications:  sodium chloride, sodium chloride, traMADol   Past Medical History  Diagnosis Date  . Hypertension   . Thyroid disease   . Depression   . Cataract   . Osteoporosis   . IBS (irritable bowel syndrome)   . Anemia   . Hearing loss   . Bilateral leg edema   . Stasis ulcer of lower extremity     followed by wound care  . Peripheral neuropathy   . Critical lower limb ischemia   . Arthritis     Past Surgical History  Procedure Laterality Date  . Back surgery x2    . Neck surgery    . Hip surgery    . Eye surgery      CATARACT REMOVAL  . Abdominal hysterectomy      Family History  Problem Relation Age of Onset  . Hypertension Mother   . Hypertension Father   . Stroke Father   . Heart disease Brother   . Heart disease Brother     Social History Ms. Slates reports that she has never smoked. She has never used smokeless tobacco. [name] reports that she does not drink alcohol.  Review of Systems CONSTITUTIONAL: generalized weakness HEENT: Eyes: No visual loss, blurred  vision, double vision or yellow sclerae. No hearing loss, sneezing, congestion, runny nose or sore throat.  SKIN: No rash or itching.  CARDIOVASCULAR: No chest pain, chest pressure or chest discomfort. No palpitations or edema.  RESPIRATORY: No shortness of breath, cough or sputum.  GASTROINTESTINAL: No anorexia, nausea, vomiting or diarrhea. No abdominal pain or blood.  GENITOURINARY: no polyuria, no dysuria NEUROLOGICAL: No headache, dizziness, syncope, paralysis, ataxia, numbness or tingling in the extremities. No change in bowel or bladder control.  MUSCULOSKELETAL: No muscle, back pain, joint pain or stiffness.  HEMATOLOGIC: No anemia, bleeding or bruising.  LYMPHATICS: No enlarged nodes. No history of splenectomy.  PSYCHIATRIC: No history of depression or anxiety.      Physical  Examination Blood pressure 114/50, pulse 131, temperature 98.8 F (37.1 C), temperature source Oral, resp. rate 19, height 4\' 10"  (1.473 m), weight 105 lb 13.1 oz (48 kg), SpO2 90.00%. No intake or output data in the 24 hours ending 04/26/14 1459  HEENT: sclera clear, throat clear  Cardiovascular: irreg, rate 110, no m/r/g, no JVD  Respiratory: CTAB  GI: abdomen soft, NT, ND  MSK: no LE edema  Neuro: no focal deficits  Psych: appropriate affect   Lab Results  Basic Metabolic Panel:  Recent Labs Lab 04/26/14 1112  NA 137  K 3.9  CL 99  CO2 28  GLUCOSE 128*  BUN 24*  CREATININE 1.21*  CALCIUM 8.8    Liver Function Tests: No results found for this basename: AST, ALT, ALKPHOS, BILITOT, PROT, ALBUMIN,  in the last 168 hours  CBC:  Recent Labs Lab 04/26/14 1112  WBC 10.0  NEUTROABS 8.1*  HGB 10.3*  HCT 30.6*  MCV 91.9  PLT 280    Cardiac Enzymes:  Recent Labs Lab 04/26/14 1112  TROPONINI <0.30    BNP: No components found with this basename: POCBNP,    ECG   Imaging 12/2007 Echo LEFT VENTRICLE: - Left ventricular size was normal. - There were no left ventricular regional wall motion abnormalities. - Left ventricular wall thickness was mildly increased.  Doppler interpretation(s): - The ratio of early left ventricular filling to atrial contraction was within the normal range.  AORTIC VALVE: - The aortic valve was grossly normal. - The aortic valve was not well visualized. - Aortic valve thickness was mildly increased. - There was normal aortic valve leaflet excursion.  Doppler interpretation(s): - There was no evidence for aortic valve stenosis. - There was no significant aortic valvular regurgitation.  AORTA: - The aortic root was normal in size.  RIGHT VENTRICLE: - Right ventricular systolic function was normal.  TRICUSPID VALVE: - The tricuspid valve structure was normal.  Doppler interpretation(s): - There was mild to  moderate tricuspid valvular regurgitation.  ---------------------------------------------------------------  SUMMARY - There were no left ventricular regional wall motion abnormalities. Left ventricular wall thickness was mildly increased. - Aortic valve thickness was mildly increased. - There was mild to moderate tricuspid valvular regurgitation. - Features were consistent with pulmonary hypertension.  IMPRESSIONS - Features were consistent with pulmonary hypertension.  --------------------------------------------------------------- .    Impression/Recommendations  1. Afib - from notes had done well long term on amiodarone. Recently stopped a few months ago, it appears due to low blood pressures. - patient with maintained elevated rates despite fairly high dose of dilt gtt. From notes appears borderline to low bp's have been an issue, don't anticipate she would be likely to tolerate long term metoprolol or diltiazem. Digoxin unlikely to be sufficient therapy - will  restart amio, do not suspect her prior maitenence dose of 100mg  of daily would cause any significant hypotension. Will stop dilt gtt, load with amio over 24 hours. Then start oral amio 200mg  bid for 3 weeks then 200mg  daily.  - patient with extensive contiued fall history, deemed to high risk previously for anticoagulation. Will not start this admission. - follow up TSH, echo    Dina RichJonathan Hargis Vandyne, M.D., F.A.C.C.

## 2014-04-26 NOTE — H&P (Signed)
[facility] History and Physical  Tammy LipsRachel M Giacomo ZOX:[id number] DOB: September 16, 1920 DOA: 04/26/2014  Referring physician:  PCP: Milinda AntisURHAM, KAWANTA, MD  Specialists:   Chief Complaint: Fast heart rate  HPI: [name] is a 80 y.o. female  With a history of type to thyroidism, hypertension, that presented to the emergency department with complaints of generalized weakness and fast heart rate. Patient states that her weakness has been ongoing for the last several days. She is accompanied by her granddaughter who also stated that she has not been herself lately. Patient admits to hitting her leg a few days ago, and coming to the emergency department on 04/16/2014 after falling out of bed. Patient was seen and sent home. She was to follow up with Dr. Tanda RockersNichols for wound care. Again over the last several days, patient has had weakness, she was seen by her home health nurse today and was found to have a rapid heart rate. Patient was then sent to the emergency department. At this time patient denies any chest pain shortness of breath, abdominal pain, nausea, vomiting, diarrhea, recent travel, or sick contacts.  Review of Systems:  Constitutional: Denies fever, chills, diaphoresis, appetite change.  Complains of weakness.  HEENT: Denies photophobia, eye pain, redness, hearing loss, ear pain, congestion, sore throat, rhinorrhea, sneezing, mouth sores, trouble swallowing, neck pain, neck stiffness and tinnitus.   Respiratory: Denies SOB, DOE, cough, chest tightness,  and wheezing.   Cardiovascular: Complains of fast heart rate. Denies chest pain. Gastrointestinal: Denies nausea, vomiting, abdominal pain, diarrhea, constipation, blood in stool and abdominal distention.  Genitourinary: Denies dysuria, urgency, frequency, hematuria, flank pain and difficulty urinating.  Musculoskeletal: Denies myalgias, back pain, joint swelling, arthralgias and gait problem.  Skin: Has injury to her right leg.     Neurological: Feels weak.   Hematological: Denies adenopathy. Easy bruising, personal or family bleeding history  Psychiatric/Behavioral: Denies suicidal ideation, mood changes, confusion, nervousness, sleep disturbance and agitation  Past Medical History  Diagnosis Date  . Hypertension   . Thyroid disease   . Depression   . Cataract   . Osteoporosis   . IBS (irritable bowel syndrome)   . Anemia   . Hearing loss   . Bilateral leg edema   . Stasis ulcer of lower extremity     followed by wound care  . Peripheral neuropathy   . Critical lower limb ischemia   . Arthritis    Past Surgical History  Procedure Laterality Date  . Back surgery x2    . Neck surgery    . Hip surgery    . Eye surgery      CATARACT REMOVAL  . Abdominal hysterectomy     Social History:  reports that she has never smoked. She has never used smokeless tobacco. She reports that she does not drink alcohol or use illicit drugs. Lives at home alone, however has home health, and granddaughter who lives close by. Uses a walker for ambulation  Allergies  Allergen Reactions  . Acetaminophen-Codeine     REACTION: causes nausea  . Sulfa Antibiotics Other (See Comments)    Makes her 'sick'    Family History  Problem Relation Age of Onset  . Hypertension Mother   . Hypertension Father   . Stroke Father   . Heart disease Brother   . Heart disease Brother    Prior to Admission medications   Medication Sig Start Date End Date Taking? Authorizing Provider  amLODipine (NORVASC) 5 MG tablet Take 1 tablet (  5 mg total) by mouth daily. 09/27/13  Yes Salley Scarlet, MD  aspirin EC 81 MG tablet Take 81 mg by mouth every morning.    Yes Historical Provider, MD  Calcium-Vitamin D (CALTRATE 600 PLUS-VIT D PO) Take 1 tablet by mouth 2 (two) times daily.     Yes Historical Provider, MD  cloNIDine (CATAPRES) 0.1 MG tablet Take 1 tablet (0.1 mg total) by mouth at bedtime. 04/24/14  Yes Salley Scarlet, MD  furosemide  (LASIX) 20 MG tablet TAKE ONE TABLET DAILY. 04/23/14  Yes Salley Scarlet, MD  gabapentin (NEURONTIN) 300 MG capsule TAKE (1) CAPSULE BY MOUTH IN THE MORNING AND (2) CAPSULES AT BEDTIME. 02/15/14  Yes Salley Scarlet, MD  levothyroxine (SYNTHROID, LEVOTHROID) 50 MCG tablet TAKE ONE TABLET DAILY. 01/09/14  Yes Salley Scarlet, MD  mirabegron ER (MYRBETRIQ) 25 MG TB24 tablet Take 25 mg by mouth daily.   Yes Historical Provider, MD  Multiple Vitamins-Minerals (SENIOR MULTIVITAMIN PLUS) TABS Take 1 tablet by mouth every morning.    Yes Historical Provider, MD  Polyethyl Glycol-Propyl Glycol (SYSTANE OP) Apply 1-2 drops to eye daily.   Yes Historical Provider, MD  traMADol (ULTRAM) 50 MG tablet TAKE (1) TABLET BY MOUTH TWICE A DAY AS NEEDED. 02/26/14  Yes Salley Scarlet, MD  triamcinolone cream (KENALOG) 0.1 % Apply 1 application topically every other day.  06/15/13  Yes Historical Provider, MD  venlafaxine (EFFEXOR) 37.5 MG tablet TAKE ONE TABLET DAILY. 04/23/14  Yes Salley Scarlet, MD   Physical Exam: Filed Vitals:   04/26/14 1300  BP: 109/80  Pulse: 111  Temp:   Resp: 17     General: Well developed, frail, NAD, appears stated age  HEENT: NCAT, PERRLA, EOMI, Anicteic Sclera, mucous membranes moist.   Neck: Supple, no JVD, no masses  Cardiovascular: S1 S2 auscultated, tachycardic, irregular.  Respiratory: Clear to auscultation bilaterally with equal chest rise  Abdomen: Soft, nontender, nondistended, + bowel sounds  Extremities: warm dry without cyanosis clubbing or edema  Neuro: AAOx3, cranial nerves grossly intact. Strength 5/5 in patient's upper and lower extremities bilaterally  Skin: RLE- erythema, skin tears.  LLE bruising with color change  Psych: Normal affect and demeanor with intact judgement and insight  Labs on Admission:  Basic Metabolic Panel:  Recent Labs Lab 04/26/14 1112  NA 137  K 3.9  CL 99  CO2 28  GLUCOSE 128*  BUN 24*  CREATININE 1.21*  CALCIUM  8.8   Liver Function Tests: No results found for this basename: AST, ALT, ALKPHOS, BILITOT, PROT, ALBUMIN,  in the last 168 hours No results found for this basename: LIPASE, AMYLASE,  in the last 168 hours No results found for this basename: AMMONIA,  in the last 168 hours CBC:  Recent Labs Lab 04/26/14 1112  WBC 10.0  NEUTROABS 8.1*  HGB 10.3*  HCT 30.6*  MCV 91.9  PLT 280   Cardiac Enzymes:  Recent Labs Lab 04/26/14 1112  TROPONINI <0.30    BNP (last 3 results) No results found for this basename: PROBNP,  in the last 8760 hours CBG: No results found for this basename: GLUCAP,  in the last 168 hours  Radiological Exams on Admission: Dg Chest Portable 1 View  04/26/2014   CLINICAL DATA:  Weakness.  Atrial fibrillation  EXAM: PORTABLE CHEST - 1 VIEW  COMPARISON:  12/30/2010  FINDINGS: Mild cardiac enlargement. Vascularity is normal. Negative for edema  Small bilateral pleural effusions and mild left lower  lobe atelectasis. Negative for pneumonia.  IMPRESSION: Small bilateral effusions and mild bibasilar atelectasis. Negative for edema.   Electronically Signed   By: Marlan Palauharles  Clark M.D.   On: 04/26/2014 11:49    EKG: Independently reviewed. Atrial fibrillation with RVR, rate 131  Assessment/Plan  Atrial fibrillation with rapid ventricular response -Patient will be admitted to step down unit -Patient started on Cardizem drip in the emergency department, will continue per protocol -Will obtain TSH, magnesium, phosphate levels -Will also consult cardiology for further management -Will obtain 2-D echocardiogram -Patient has had a history of atrial fibrillation in the past however was only taking an aspirin, was on any anticoagulation or antiarrhythmic medications  Generalized weakness -Will check TSH, vitamin B 12, folate, vitamin D levels -Possibly related to patient's atrial fibrillation with rapid ventricular response -Will also rule out infection, will obtain urine  analysis -Chest x-ray showed small bowel effusions and mild bibasilar atelectasis, negative for edema or pneumonia -Will also consult physical therapy -Will consult case management/social work for discharge planning and possible placement  Right lower extremity cellulitis -Will place patient on doxycycline -Currently afebrile no leukocytosis -Will consult wound care  Normocytic Anemia -Baseline hemoglobin appears to be 12 -Currently 10.3 -Will obtain anemia panel as well as fecal occult -Will continue to monitor CBC  Chronic kidney disease, stage III -Creatinine appears to be at baseline, continue to monitor BMP  Hypothyroidism -Will obtain TSH level -Continue Synthroid  Peripheral neuropathy -Will continue gabapentin  Constipation -Will place on stool softener  Depression -Continue Effexor  Peripheral arterial disease -Continue aspirin  Hypertension -Stable continue amlodipine, Lasix, clonidine  DVT prophylaxis: Heparin  Code Status: DNR  Condition: Guarded  Family Communication: Granddaughter at bedside. Admission, patients condition and plan of care including tests being ordered have been discussed with the patient and granddaughter who indicate understanding and agree with the plan and Code Status.  Disposition Plan: Admitted.   Time spent: 65 minutes  Retha Bither D.O. [facility] Pager [phone]  If 7PM-7AM, please contact night-coverage [email or web address] Password [email or web address] 04/26/2014, 1:22 PM

## 2014-04-26 NOTE — Progress Notes (Signed)
UR chart review completed.  

## 2014-04-26 NOTE — ED Notes (Signed)
Hospitalist at bedside at this time 

## 2014-04-26 NOTE — Consult Note (Addendum)
WOC wound consult note Reason for Consult: Consult requested for bilat legs.  Performed by remote camera with bedside nursing assistance. Pt is followed as an outpatient by Dr Tanda RockersNichols [place].  She states that wounds have been present for several years; they improve then she gets others.  They are usually traumatic wounds related to falls.  Skin is very thin and fragile.  Left leg with multiple dark purple bruises but no open wounds or drainage; no need for topical treatment at this time.  Right leg with multiple full thickness wounds.  Above knee: 3X3X.2cm, 50% red, 50% yellow, small amt yellow drainage, no odor. Right leg below knee: 5.5X4X.1cm abrasion with skin 90% approximated over wound; 100% red and dry without odor or drainage. Right shin: 8.5X2.5X.2cm 100% red and dry without odor or drainage. Right lower leg: 10X4.5X.2cm; 90% yellow slough, 10% red, mod amt yellow drainage, no odor. Right posterior leg: 4.5X3X.2cm, 100% red, minimal amt drainage, no odor. Dressing procedure/placement/frequency: Santyl for chemical debridement of nonviable tissue to upper knee and lower shin wound.  Xeroform to protect and promote healing to other wounds.  Pt can resume follow-up with outpatient wound care center after discharge. Please re-consult if further assistance is needed.  Thank-you,  Cammie Mcgeeawn Haeleigh Streiff MSN, RN, CWOCN, [place], CNS [phone]

## 2014-04-26 NOTE — ED Notes (Signed)
[facility]

## 2014-04-26 NOTE — ED Provider Notes (Signed)
CSN: [id number]     Arrival date & time 04/26/14  1055 History  This chart was scribed for Joya Gaskinsonald W Charnette Younkin, MD by Leone PayorSonum Patel, ED Scribe. This patient was seen in room APA02/APA02 and the patient's care was started 11:14 AM.    Chief Complaint  Patient presents with  . Extremity Weakness    Patient is a 80 y.o. female presenting with extremity weakness. The history is provided by the patient. No language interpreter was used.  Extremity Weakness This is a new problem. The current episode started more than 2 days ago. The problem occurs constantly. The problem has been gradually worsening. Pertinent negatives include no chest pain and no shortness of breath. The symptoms are aggravated by walking. She has tried rest for the symptoms. The treatment provided no relief.    HPI Comments: Madelon LipsRachel M Belen is a 80 y.o. female who presents to the Emergency Department complaining of constant, gradually worsening generalized weakness for the past few days. Patient reports having bilateral leg weakness this morning and had to be lowered to the ground by caregiver. She uses a walker at baseline but has been unable to recently. She was seen on 04/16/14 for a fall and was discharged home at that time. She has been following up with Dr. Tanda RockersNichols for wound care for the skin tears from the fall. She denies chest pain, SOB, fever, vomiting, LOC, lightheadedness.   [facility]  Past Medical History  Diagnosis Date  . Hypertension   . Thyroid disease   . Depression   . Cataract   . Osteoporosis   . IBS (irritable bowel syndrome)   . Anemia   . Hearing loss   . Bilateral leg edema   . Stasis ulcer of lower extremity     followed by wound care  . Peripheral neuropathy   . Critical lower limb ischemia   . Arthritis    Past Surgical History  Procedure Laterality Date  . Back surgery x2    . Neck surgery    . Hip surgery    . Eye surgery      CATARACT REMOVAL  . Abdominal hysterectomy     Family History   Problem Relation Age of Onset  . Hypertension Mother   . Hypertension Father   . Stroke Father   . Heart disease Brother   . Heart disease Brother    History  Substance Use Topics  . Smoking status: Never Smoker   . Smokeless tobacco: Never Used  . Alcohol Use: No     Comment: occasional   OB History   Grav Para Term Preterm Abortions TAB SAB Ect Mult Living                 Review of Systems  Constitutional: Negative for fever.  Respiratory: Negative for shortness of breath.   Cardiovascular: Negative for chest pain.  Gastrointestinal: Negative for vomiting.  Musculoskeletal: Positive for extremity weakness.  Skin: Positive for wound (from previous injury ).  Neurological: Positive for weakness (generalized). Negative for syncope and light-headedness.  All other systems reviewed and are negative.     Allergies  Acetaminophen-codeine and Sulfa antibiotics  Home Medications   Prior to Admission medications   Medication Sig Start Date End Date Taking? Authorizing Provider  amLODipine (NORVASC) 5 MG tablet Take 1 tablet (5 mg total) by mouth daily. 09/27/13   Salley ScarletKawanta F [place], MD  aspirin EC 81 MG tablet Take 81 mg by mouth every morning.  Historical Provider, MD  Calcium-Vitamin D (CALTRATE 600 PLUS-VIT D PO) Take 1 tablet by mouth 2 (two) times daily.      Historical Provider, MD  cloNIDine (CATAPRES) 0.1 MG tablet Take 1 tablet (0.1 mg total) by mouth at bedtime. 04/24/14   Salley ScarletKawanta F [place], MD  furosemide (LASIX) 20 MG tablet TAKE ONE TABLET DAILY. 04/23/14   Salley ScarletKawanta F [place], MD  gabapentin (NEURONTIN) 300 MG capsule TAKE (1) CAPSULE BY MOUTH IN THE MORNING AND (2) CAPSULES AT BEDTIME. 02/15/14   Salley ScarletKawanta F [place], MD  levothyroxine (SYNTHROID, LEVOTHROID) 50 MCG tablet TAKE ONE TABLET DAILY. 01/09/14   Salley ScarletKawanta F [place], MD  mirabegron ER (MYRBETRIQ) 25 MG TB24 tablet Take 25 mg by mouth daily.    Historical Provider, MD  Multiple Vitamins-Minerals (SENIOR MULTIVITAMIN  PLUS) TABS Take 1 tablet by mouth every morning.     Historical Provider, MD  Polyethyl Glycol-Propyl Glycol (SYSTANE OP) Apply 1-2 drops to eye daily.    Historical Provider, MD  traMADol (ULTRAM) 50 MG tablet TAKE (1) TABLET BY MOUTH TWICE A DAY AS NEEDED. 02/26/14   Salley ScarletKawanta F [place], MD  triamcinolone cream (KENALOG) 0.1 % daily. 06/15/13   Historical Provider, MD  venlafaxine (EFFEXOR) 37.5 MG tablet TAKE ONE TABLET DAILY. 04/23/14   Salley ScarletKawanta F [place], MD   BP 124/67  Pulse 138  Temp(Src) 98.3 F (36.8 C) (Oral)  Resp 17  Ht 4\' 10"  (1.473 m)  Wt 105 lb (47.628 kg)  BMI 21.95 kg/m2  SpO2 95% BP 113/67  Pulse 124  Temp(Src) 98.8 F (37.1 C) (Oral)  Resp 15  Ht 4\' 10"  (1.473 m)  Wt 105 lb 13.1 oz (48 kg)  BMI 22.12 kg/m2  SpO2 96%  Physical Exam  Nursing note and vitals reviewed.  CONSTITUTIONAL: elderly and frail  HEAD: Normocephalic/atraumatic ENMT: Mucous membranes moist NECK: supple no meningeal signs SPINE:entire spine nontender CV: tachycardic and irregular  LUNGS: Lungs are clear to auscultation bilaterally, no apparent distress ABDOMEN: soft, nontender, no rebound or guarding GU:no cva tenderness NEURO: Pt is awake/alert, moves all extremitiesx4, no arm or leg drift noted, no focal weakness noted.  EXTREMITIES: pulses normal, full ROM, evidence of previous skin tears to lower extremities with overlying erythema and tenderness, no crepitus noted SKIN: warm, color normal PSYCH: no abnormalities of mood noted  ED Course  Procedures  CRITICAL CARE Performed by: Joya GaskinsWICKLINE,Charls Custer W Total critical care time: 35 Critical care time was exclusive of separately billable procedures and treating other patients. Critical care was necessary to treat or prevent imminent or life-threatening deterioration. Critical care was time spent personally by me on the following activities: development of treatment plan with patient and/or surrogate as well as nursing, discussions with  consultants, evaluation of patient's response to treatment, examination of patient, obtaining history from patient or surrogate, ordering and performing treatments and interventions, ordering and review of laboratory studies, ordering and review of radiographic studies, pulse oximetry and re-evaluation of patient's condition. Pt found to be in atrial fibrillation with RVR with heart rate >130 requiring IV cardizem drip  DIAGNOSTIC STUDIES: Oxygen Saturation is 95% on RA, adequate by my interpretation.    COORDINATION OF CARE: 11:20 AM Discussed the need for admission. Discussed treatment plan with pt at bedside and pt agreed to plan.  Pt found to be in afib with RVR cardizem started She required IV drip as it did not respond to initial cardizem bolus Pt stabilized in the ED I also gave dose of oral  doxycycline for possible cellulitis to right LE Pt admittted to stepdown unit D/w dr Catha Gosselin will admit to stepdown   Labs Review Labs Reviewed  BASIC METABOLIC PANEL - Abnormal; Notable for the following:    Glucose, Bld 128 (*)    BUN 24 (*)    Creatinine, Ser 1.21 (*)    GFR calc non Af Amer 38 (*)    GFR calc Af Amer 44 (*)    All other components within normal limits  CBC WITH DIFFERENTIAL - Abnormal; Notable for the following:    RBC 3.33 (*)    Hemoglobin 10.3 (*)    HCT 30.6 (*)    Neutrophils Relative % 82 (*)    Neutro Abs 8.1 (*)    Lymphocytes Relative 9 (*)    All other components within normal limits  URINE CULTURE  MRSA PCR SCREENING  TROPONIN I  TSH  URINALYSIS, ROUTINE W REFLEX MICROSCOPIC  MAGNESIUM  PHOSPHORUS  VITAMIN B12  FOLATE RBC  VITAMIN D 1,25 DIHYDROXY  VITAMIN D 25 HYDROXY  FOLATE  IRON AND TIBC  FERRITIN  RETICULOCYTES    Imaging Review Dg Chest Portable 1 View  04/26/2014   CLINICAL DATA:  Weakness.  Atrial fibrillation  EXAM: PORTABLE CHEST - 1 VIEW  COMPARISON:  12/30/2010  FINDINGS: Mild cardiac enlargement. Vascularity is normal.  Negative for edema  Small bilateral pleural effusions and mild left lower lobe atelectasis. Negative for pneumonia.  IMPRESSION: Small bilateral effusions and mild bibasilar atelectasis. Negative for edema.   Electronically Signed   By: Marlan Palau M.D.   On: 04/26/2014 11:49     Date: 04/26/2014  Rate: 131  Rhythm: atrial fibrillation  QRS Axis: normal  Intervals: normal  ST/T Wave abnormalities: nonspecific ST changes  Conduction Disutrbances:none  Narrative Interpretation:   Old EKG Reviewed: changes noted and atrial fibrillation is new    MDM   Final diagnoses:  Atrial fibrillation with rapid ventricular response  Cellulitis of right lower extremity  Dehydration    Nursing notes including past medical history and social history reviewed and considered in documentation xrays reviewed and considered Labs/vital reviewed and considered   I personally performed the services described in this documentation, which was scribed in my presence. The recorded information has been reviewed and is accurate.      Joya Gaskins, MD 04/26/14 [phone]

## 2014-04-26 NOTE — ED Notes (Signed)
PT has been feeling weak and sleeping more for a few days. PT had leg weakness this morning with caregiver and lowered pt to floor.

## 2014-04-27 DIAGNOSIS — L02419 Cutaneous abscess of limb, unspecified: Secondary | ICD-10-CM

## 2014-04-27 DIAGNOSIS — I059 Rheumatic mitral valve disease, unspecified: Secondary | ICD-10-CM

## 2014-04-27 DIAGNOSIS — L03119 Cellulitis of unspecified part of limb: Secondary | ICD-10-CM

## 2014-04-27 DIAGNOSIS — E785 Hyperlipidemia, unspecified: Secondary | ICD-10-CM

## 2014-04-27 DIAGNOSIS — N3946 Mixed incontinence: Secondary | ICD-10-CM

## 2014-04-27 DIAGNOSIS — N390 Urinary tract infection, site not specified: Secondary | ICD-10-CM

## 2014-04-27 LAB — TSH: TSH: 0.181 u[IU]/mL — AB (ref 0.350–4.500)

## 2014-04-27 LAB — COMPREHENSIVE METABOLIC PANEL
ALBUMIN: 2.7 g/dL — AB (ref 3.5–5.2)
ALT: 15 U/L (ref 0–35)
AST: 34 U/L (ref 0–37)
Alkaline Phosphatase: 101 U/L (ref 39–117)
Anion gap: 11 (ref 5–15)
BUN: 27 mg/dL — AB (ref 6–23)
CO2: 28 mEq/L (ref 19–32)
Calcium: 8.8 mg/dL (ref 8.4–10.5)
Chloride: 95 mEq/L — ABNORMAL LOW (ref 96–112)
Creatinine, Ser: 1.37 mg/dL — ABNORMAL HIGH (ref 0.50–1.10)
GFR calc Af Amer: 38 mL/min — ABNORMAL LOW (ref >90)
GFR calc non Af Amer: 33 mL/min — ABNORMAL LOW (ref >90)
Glucose, Bld: 103 mg/dL — ABNORMAL HIGH (ref 70–99)
Potassium: 3.4 mEq/L — ABNORMAL LOW (ref 3.7–5.3)
Sodium: 134 mEq/L — ABNORMAL LOW (ref 137–147)
TOTAL PROTEIN: 6.2 g/dL (ref 6.0–8.3)
Total Bilirubin: 0.2 mg/dL — ABNORMAL LOW (ref 0.3–1.2)

## 2014-04-27 LAB — FOLATE RBC: RBC FOLATE: 1674 ng/mL — AB (ref >280)

## 2014-04-27 LAB — CBC
HEMATOCRIT: 28.8 % — AB (ref 36.0–46.0)
Hemoglobin: 9.7 g/dL — ABNORMAL LOW (ref 12.0–15.0)
MCH: 30.4 pg (ref 26.0–34.0)
MCHC: 33.7 g/dL (ref 30.0–36.0)
MCV: 90.3 fL (ref 78.0–100.0)
Platelets: 257 10*3/uL (ref 150–400)
RBC: 3.19 MIL/uL — ABNORMAL LOW (ref 3.87–5.11)
RDW: 14 % (ref 11.5–15.5)
WBC: 7.5 10*3/uL (ref 4.0–10.5)

## 2014-04-27 LAB — FOLATE: Folate: >20 ng/mL

## 2014-04-27 LAB — FERRITIN: FERRITIN: 147 ng/mL (ref 10–291)

## 2014-04-27 LAB — VITAMIN D 25 HYDROXY (VIT D DEFICIENCY, FRACTURES): Vit D, 25-Hydroxy: 67 ng/mL (ref 30–89)

## 2014-04-27 LAB — VITAMIN B12: VITAMIN B 12: 867 pg/mL (ref 211–911)

## 2014-04-27 MED ORDER — ALBUTEROL SULFATE (2.5 MG/3ML) 0.083% IN NEBU
2.5000 mg | INHALATION_SOLUTION | RESPIRATORY_TRACT | Status: DC | PRN
  Administered 2014-04-27 – 2014-04-29 (×4): 2.5 mg via RESPIRATORY_TRACT
  Filled 2014-04-27 (×5): qty 3

## 2014-04-27 MED ORDER — AMIODARONE HCL 200 MG PO TABS
200.0000 mg | ORAL_TABLET | Freq: Two times a day (BID) | ORAL | Status: DC
  Administered 2014-04-27 – 2014-04-30 (×6): 200 mg via ORAL
  Filled 2014-04-27 (×6): qty 1

## 2014-04-27 MED ORDER — FERROUS SULFATE 325 (65 FE) MG PO TABS
325.0000 mg | ORAL_TABLET | Freq: Two times a day (BID) | ORAL | Status: DC
  Administered 2014-04-27 – 2014-04-30 (×8): 325 mg via ORAL
  Filled 2014-04-27 (×8): qty 1

## 2014-04-27 MED ORDER — POTASSIUM CHLORIDE CRYS ER 20 MEQ PO TBCR
40.0000 meq | EXTENDED_RELEASE_TABLET | Freq: Once | ORAL | Status: AC
  Administered 2014-04-27: 40 meq via ORAL
  Filled 2014-04-27: qty 2

## 2014-04-27 MED ORDER — POLYETHYLENE GLYCOL 3350 17 G PO PACK
17.0000 g | PACK | Freq: Every day | ORAL | Status: DC
  Administered 2014-04-27 – 2014-04-30 (×4): 17 g via ORAL
  Filled 2014-04-27 (×4): qty 1

## 2014-04-27 NOTE — Clinical Social Work Psychosocial (Signed)
Clinical Social Work Department BRIEF PSYCHOSOCIAL ASSESSMENT 04/27/2014  Patient:  [name], [name]     Account Number:  [id number]     Admit date:  04/26/2014  Clinical Social Worker:  Wyatt Haste  Date/Time:  04/27/2014 12:43 PM  Referred by:  RN  Date Referred:  04/27/2014 Referred for  SNF Placement   Other Referral:   Interview type:  Patient Other interview type:   son- [name]    PSYCHOSOCIAL DATA Living Status:  ALONE Admitted from facility:   Level of care:   Primary support name:  [name]/David Primary support relationship to patient:  CHILD, ADULT Degree of support available:   supportive    CURRENT CONCERNS Current Concerns  Post-Acute Placement   Other Concerns:    SOCIAL WORK ASSESSMENT / PLAN CSW met with pt and pt's son [name] at bedside. Pt alert and oriented and reports she lives alone. She has two sons, [name] and [name] who both live several hours away. Sons are supportive and do what they can being out of town. Pt reports her best local support are friends. They assist pt with errands and taking her to appointments as pt has never driven. Her husband died about 12 years ago and pt has lived alone since then. She has managed fairly well until the last few weeks when she began experiencing leg weakness. She was staying with a friend because her power was out and pt fell out of bed, resulting in wounds to right leg. She had home health come out for wound care and when they saw pt yesterday, pt's heart rate was elevated. They called EMS and pt waas brought to ED. At baseline, pt ambulates with a walker. She has hired someone to come into the home 5 days a week for 3 hours a day to help with household tasks. PT evaluated pt today and recommendation is for SNF. Pt states she has been multiple times before after fractures. She has been to [facility] and [facility]. Pt agreeable to [facility] placement and son definitely feels pt would benefit. Family was beginning to look into  SNF from home prior to admission. Pt has long term care policy. They are aware of Medicare coverage/criteria. SNF list provided.   Assessment/plan status:  Psychosocial Support/Ongoing Assessment of Needs Other assessment/ plan:   Information/referral to community resources:   SNF list    PATIENT'S/FAMILY'S RESPONSE TO PLAN OF CARE: Pt and son report positive feelings regarding SNF for rehab and wound care. CSW will initiate bed search and follow up with bed offers when available.       Benay Pike, [place]

## 2014-04-27 NOTE — Clinical Social Work Placement (Signed)
Clinical Social Work Department CLINICAL SOCIAL WORK PLACEMENT NOTE 04/27/2014  Patient:  [name],[name]  Account Number:  [id number] Admit date:  04/26/2014  Clinical Social Worker:  Derenda FennelKARA Rayley Gao, LCSW  Date/time:  04/27/2014 12:41 PM  Clinical Social Work is seeking post-discharge placement for this patient at the following level of care:   SKILLED NURSING   (*CSW will update this form in Epic as items are completed)   04/27/2014  Patient/family provided with [name] Department of Clinical Social Work's list of facilities offering this level of care within the geographic area requested by the patient (or if unable, by the patient's family).  04/27/2014  Patient/family informed of their freedom to choose among providers that offer the needed level of care, that participate in Medicare, Medicaid or managed care program needed by the patient, have an available bed and are willing to accept the patient.  04/27/2014  Patient/family informed of [facility]' ownership interest in [facility], as well as of the fact that they are under no obligation to receive care at this facility.  PASARR submitted to EDS on  PASARR number received on   FL2 transmitted to all facilities in geographic area requested by pt/family on  04/27/2014 FL2 transmitted to all facilities within larger geographic area on   Patient informed that his/her managed care company has contracts with or will negotiate with  certain facilities, including the following:     Patient/family informed of bed offers received:  04/27/2014 [facility] [place] Physician [facility] and patient [facility]   Patient to be transferred to  on   Patient to be transferred to facility by  Patient and family notified of transfer on  Name of family member notified:    The following physician request were entered in Epic:   Additional Comments: pt has existing pasarr.  Derenda FennelKara  Gibran Veselka, [place] [phone]

## 2014-04-27 NOTE — Evaluation (Signed)
Physical Therapy Evaluation Patient Details Name: [name] MRN: [id number] DOMadelon LipsB: 09-02-1920 Today's Date: 04/27/2014   History of Present Illness  Pt is a 80 year old female with a history of type to thyroidism, hypertension, that presented to the emergency department with complaints of generalized weakness and fast heart rate. Patient states that her weakness has been ongoing for the last several days. She is accompanied by her granddaughter who also stated that she has not been herself lately. Patient admits to hitting her leg a few days ago, and coming to the emergency department on 04/16/2014 after falling out of bed. Patient was seen and sent home. She was to follow up with Dr. Tanda RockersNichols for wound care. Again over the last several days, patient has had weakness, she was seen by her home health nurse today and was found to have a rapid heart rate. Patient was then sent to the emergency department. At this time patient denies any chest pain shortness of breath, abdominal pain, nausea, vomiting, diarrhea, recent travel, or sick contacts.  Clinical Impression  Pt is a 80 year old female who presents to physical therapy with dx of generalized weakness and increased heart rate.  Pt reports she had a fall out of bed on 04/16/14, wounding her Rt LE.  Rt LE is bandaged, and she is currently unable to fully straighten this leg.  Due to fall, pt was having an RN come to the house to assess health and wound, though at the first visit the pt was referred to the ED due to increased HR.  Pt lives alone in a single story home with 1 step to enter.  Pts granddaughter and her husband live across the street and are available PRN, and she has an aide that comes to the house to assist 3 hours a day M-F; the rest of the day the patient is alone.  In the past week, the pt reports increasing weakness as she has been unable to move around as much due to the wounds on the Rt leg.  Previously, the pt was mod (I) for bed  mobility skills, transfers, and household amb skills with use of a RW.  During evaluation, the pt required mod assist to complete bed mobility skills and min/mod assist for transfers.  Pt unable to amb after transfer to chair secondary to fatigue.  Noted limited WB on the Rt LE and pt used the Lt LE to complete pivot transfer to chair.  Recommend continued PT while in the hospital to address strengthening, ROM, activity tolerance, and balance to improve functional mobility skills with transition to SNF at discharge.  No DME recommendations at this time.      Follow Up Recommendations SNF    Equipment Recommendations  None recommended by PT       Precautions / Restrictions Precautions Precautions: Fall Restrictions Weight Bearing Restrictions: No Other Position/Activity Restrictions: Pt demonstrates decreased WB on the Rt LE secondary to recent fall with wounds to the Rt leg      Mobility  Bed Mobility Overal bed mobility: Needs Assistance Bed Mobility: Supine to Sit     Supine to sit: Mod assist     General bed mobility comments: Mod assist for trunk, increased time to complete task.  Mod assist to scoot to EOB.   Transfers Overall transfer level: Needs assistance Equipment used: Rolling walker (2 wheeled) Transfers: Sit to/from [employer]Stand;Stand Pivot Transfers Sit to Stand: Mod assist Stand pivot transfers: Min assist  General transfer comment: Min assist for SPT to navigate RW and maintain balance as pt had limited WB on the Rt LE as pt was unable to fully extend knee due to wounds on this leg.   Ambulation/Gait             General Gait Details: Not assessed as pt too fatigued after transfer to chair.        Balance Overall balance assessment: Needs assistance Sitting-balance support: Feet unsupported;Bilateral upper extremity supported Sitting balance-Leahy Scale: Fair   Postural control: Posterior lean Standing balance support: Bilateral upper extremity  supported;During functional activity (On RW with min assist to maintain balance. ) Standing balance-Leahy Scale: Fair                               Pertinent Vitals/Pain No pain reported.     Home Living Family/patient expects to be discharged to:: Skilled nursing facility Living Arrangements: Alone               Additional Comments: Pt lives alone in a single story home with 1 step to enter.  Pts granddaughter and her husband live across the street and are available PRN.  Pt has an aide who assists with shopping, cooking, cleaning, showering 3 hours a day M-F, and recently an RN who is assisting with wound care on Rt LE.  DME : grab bars by toilet/shower, BSC, shower seat, RW, rollator, amnual W/C, electric scooter, cane, walk in shower    Prior Function Level of Independence: Independent with assistive device(s);Needs assistance   Gait / Transfers Assistance Needed: Pt reports she was mod (I) with bed mobility skills, transfers, and amb in the home with use of RW.   ADL's / Homemaking Assistance Needed: Pt reports she requires assistance with dressing, bathing, showering.  Pt has an aide who does the driving, cooking, shopping, cleaning.            Extremity/Trunk Assessment               Lower Extremity Assessment: Generalized weakness;RLE deficits/detail;LLE deficits/detail RLE Deficits / Details: MMT hip 3+/5, knee 4/5, ankle 4/5 LLE Deficits / Details: MMT hip 3+/5, knee/ankle deferred due to wrappings/pain on wounds to lower leg     Communication   Communication: No difficulties  Cognition Arousal/Alertness: Awake/alert Behavior During Therapy: WFL for tasks assessed/performed Overall Cognitive Status: Within Functional Limits for tasks assessed                               Assessment/Plan    PT Assessment Patient needs continued PT services  PT Diagnosis Difficulty walking;Generalized weakness   PT Problem List Decreased  strength;Decreased range of motion;Decreased activity tolerance;Decreased balance;Decreased mobility  PT Treatment Interventions Balance training;Gait training;Neuromuscular re-education;Functional mobility training;Therapeutic activities;Therapeutic exercise;Stair training   PT Goals (Current goals can be found in the Care Plan section) Acute Rehab PT Goals Patient Stated Goal: get stronger PT Goal Formulation: With patient Time For Goal Achievement: 05/11/14 Potential to Achieve Goals: Good    Frequency Min 3X/week   Barriers to discharge Decreased caregiver support         End of Session Equipment Utilized During Treatment: Gait belt;Oxygen Activity Tolerance: Patient limited by fatigue Patient left: in chair;with call bell/phone within reach;with chair alarm set           Time: 1610-9604 PT Time Calculation (min):  31 min   Charges:   PT Evaluation $Initial PT Evaluation Tier I: 1 Procedure          Karalee Hauter 04/27/2014, 9:48 AM

## 2014-04-27 NOTE — Clinical Social Work Note (Signed)
CSW presented bed offer at [facility] and pt accepts. [facility] discuss [facility] if bed is available. [facility] notified of acceptance and possible d/c Sunday per MD.  Derenda FennelKara Verlan Grotz, LCSW [phone]

## 2014-04-27 NOTE — Clinical Social Work Placement (Signed)
Clinical Social Work Department CLINICAL SOCIAL WORK PLACEMENT NOTE 04/27/2014  Patient:  [name],[name]  Account Number:  [id number] Admit date:  04/26/2014  Clinical Social Worker:  Derenda FennelKARA Annalena Piatt, LCSW  Date/time:  04/27/2014 12:41 PM  Clinical Social Work is seeking post-discharge placement for this patient at the following level of care:   SKILLED NURSING   (*CSW will update this form in Epic as items are completed)   04/27/2014  Patient/family provided with [name] Department of Clinical Social Work's list of facilities offering this level of care within the geographic area requested by the patient (or if unable, by the patient's family).  04/27/2014  Patient/family informed of their freedom to choose among providers that offer the needed level of care, that participate in Medicare, Medicaid or managed care program needed by the patient, have an available bed and are willing to accept the patient.  04/27/2014  Patient/family informed of [facility]' ownership interest in [facility], as well as of the fact that they are under no obligation to receive care at this facility.  PASARR submitted to EDS on  PASARR number received on   FL2 transmitted to all facilities in geographic area requested by pt/family on  04/27/2014 FL2 transmitted to all facilities within larger geographic area on   Patient informed that his/her managed care company has contracts with or will negotiate with  certain facilities, including the following:     Patient/family informed of bed offers received:   Patient chooses bed at  Physician recommends and patient chooses bed at    Patient to be transferred to  on   Patient to be transferred to facility by  Patient and family notified of transfer on  Name of family member notified:    The following physician request were entered in Epic:   Additional Comments: pt has existing pasarr.  Derenda FennelKara Olene Godfrey, [place] [phone]

## 2014-04-27 NOTE — Progress Notes (Signed)
  Echocardiogram 2D Echocardiogram has been performed.  [name] 04/27/2014, 2:54 PM

## 2014-04-27 NOTE — Progress Notes (Addendum)
[facility]                                                                              Patient Demographics  [name], is a 80 y.o. female, DOB - Dec 30, 1919, UXL:[id number]  Admit date - 04/26/2014   Admitting Physician Edsel Petrin, DO  Outpatient Primary MD for the patient is Milinda Antis, MD  LOS - 1   Chief Complaint  Patient presents with  . Extremity Weakness      HPI: [name] is a 80 y.o. 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 Patient states that her weakness has been ongoing for the last several days. She was accompanied by her granddaughter who also stated that she has not been herself lately. Patient admitted to hitting her leg a few days ago, and coming to the emergency department on 04/16/2014 after falling out of bed. Patient was seen and sent home. She was to follow up with Dr. Tanda Rockers for wound care. Again over the last several days, patient has had weakness, she was seen by her home health nurse today and was found to have a rapid heart rate. Patient was then sent to the emergency department. During admission, patient denied any chest pain shortness of breath, abdominal pain, nausea, vomiting, diarrhea, recent travel, or sick contacts.   Assessment & Plan   Atrial fibrillation with rapid ventricular response  -Patient was initially placed on Cardizem drip in the emergency department and continued with little improvement in her HR -Cardiology was consulted and appreciated and placed patient on amiodarone drip, and will likely be transitioned to PO today -Magnesium and phosphate levels WNL  -TSH 0.181 -Pending echocardiogram -Will continue aspirin -Patient is not a candidate for anticoagulation due to history of falls  Generalized weakness  -TSH 0.181, Vit D 25 hydroxy 67, Vit B12 867, folate >20  -Possibly related to  patient's atrial fibrillation with rapid ventricular response vs UTI -Chest x-ray showed small bowel effusions and mild bibasilar atelectasis, negative for edema or pneumonia  -PT consulted, and pending evaluation  -CM and SW consulted for possible placement  Urinary tract infection -UA: WBC TNTC, Large leukocytes, many bacteria -Will continue doxycycline -pending urine culture  Right lower extremity cellulitis  -Will place patient on doxycycline  -Currently afebrile no leukocytosis  -Will consult wound care   Normocytic Anemia  -Baseline hemoglobin appears to be 12  -Continues to drop, Hb 9.7 -Anemia panel: Iron <10, Ferritin  147 -Pending FOBT -Will add iron supplementation   Chronic kidney disease, stage III  -Creatinine appears to be at baseline, continue to monitor BMP   Hypothyroidism  -TSH 0.181 -Will check Free T4 -Continue Synthroid   Peripheral neuropathy  -Continue gabapentin   Constipation  -Continue miralax  Depression  -Continue Effexor   Peripheral arterial disease  -Continue aspirin   Hypertension  -Stable continue amlodipine, Lasix, clonidine   Code Status: DNR   Family Communication: Friend at bedside  Disposition Plan: Admitted  Time Spent in minutes   30 minutes  Procedures  Echocardiogram  Consults   Cardiology  DVT  Prophylaxis  Heparin  Lab Results  Component Value Date   PLT 257 04/27/2014    Medications  Scheduled Meds: . amLODipine  5 mg Oral Daily  . aspirin EC  81 mg Oral BH-q7a  . collagenase   Topical Daily  . doxycycline (VIBRAMYCIN) IV  100 mg Intravenous Q12H  . furosemide  20 mg Oral Daily  . gabapentin  300 mg Oral Daily  . gabapentin  600 mg Oral QHS  . heparin  5,000 Units Subcutaneous 3 times per day  . levothyroxine  50 mcg Oral QAC breakfast  . mirabegron ER  25 mg Oral Daily  . multivitamin with minerals  1 tablet Oral BH-q7a  . polyethylene glycol  17 g Oral Daily  . potassium chloride  40 mEq Oral  Once  . sodium chloride  3 mL Intravenous Q12H  . venlafaxine  37.5 mg Oral Daily   Continuous Infusions: . amiodarone 30 mg/hr (04/27/14 0748)   PRN Meds:.sodium chloride, sodium chloride, traMADol  Antibiotics    Anti-infectives   Start     Dose/Rate Route Frequency Ordered Stop   04/26/14 1500  doxycycline (VIBRAMYCIN) 100 mg in dextrose 5 % 250 mL IVPB     100 mg 125 mL/hr over 120 Minutes Intravenous Every 12 hours 04/26/14 1351     04/26/14 1130  doxycycline (VIBRA-TABS) tablet 100 mg     100 mg Oral  Once 04/26/14 1122 04/26/14 1131        Subjective:   [name] seen and examined today.  Patient states she is feeling better overall, however, she has not been out of bed.  She states she still has not had a bowel movement.  She denies dizziness, chest pain, shortness of breath, abdominal pain, N/V/D/, new weakness, numbess, tingling.    Objective:   Filed Vitals:   04/27/14 0645 04/27/14 0700 04/27/14 0732 04/27/14 0800  BP: 146/59 128/57  131/52  Pulse: 67 64  65  Temp:   98.2 F (36.8 C)   TempSrc:   Oral   Resp: 15 14  10   Height:      Weight:      SpO2: 93% 94%  100%    Wt Readings from Last 3 Encounters:  04/27/14 49 kg (108 lb 0.4 oz)  04/17/14 46.72 kg (103 lb)  04/16/14 47.174 kg (104 lb)     Intake/Output Summary (Last 24 hours) at 04/27/14 16100821 Last data filed at 04/27/14 0600  Gross per 24 hour  Intake 929.29 ml  Output     40 ml  Net 889.29 ml   Exam General: Well developed, frail, NAD, appears stated age  HEENT: NCAT, PERRLA, EOMI, Anicteic Sclera, mucous membranes moist.  Neck: Supple, no JVD, no masses  Cardiovascular: S1 S2 auscultated, RRR  Respiratory: Clear to auscultation bilaterally with equal chest rise  Abdomen: Soft, nontender, nondistended, + bowel sounds  Extremities: warm dry without cyanosis clubbing or edema  Neuro: AAOx3, No focal deficits Skin: RLE- erythema, skin tears- 2 wounds. LLE bruising with color change    Psych: Normal affect and demeanor with intact judgement and insight  Data Review   Micro Results Recent Results (from the past 240 hour(s))  MRSA PCR SCREENING     Status: None   Collection Time    04/26/14  1:53 PM      Result Value Ref Range Status   MRSA by PCR NEGATIVE  NEGATIVE Final   Comment:  The GeneXpert MRSA Assay (FDA     approved for NASAL specimens     only), is one component of a     comprehensive MRSA colonization     surveillance program. It is not     intended to diagnose MRSA     infection nor to guide or     monitor treatment for     MRSA infections.    Radiology Reports Dg Femur Right  04/16/2014   CLINICAL DATA:  Right hip pain and distal tibial skin tear after fall out of bed.  EXAM: RIGHT FEMUR - 2 VIEW  COMPARISON:  Right hip 04/26/2008  FINDINGS: Postoperative changes with short intra medullary rod and compression screw fixation of the right proximal femur. Old healed fracture deformity of the inner trochanteric region femoral neck. Periosteal reaction around the distal locking screw. No evidence of acute fracture or dislocation. Distal right femur is unremarkable. Vascular calcifications.  IMPRESSION: Old postoperative/posttraumatic changes in the right hip. No acute fractures demonstrated.   Electronically Signed   By: Burman Nieves M.D.   On: 04/16/2014 04:29   Dg Tibia/fibula Right  04/16/2014   CLINICAL DATA:  Right hip pain in skin tear after fall out of bed.  EXAM: RIGHT TIBIA AND FIBULA - 2 VIEW  COMPARISON:  None.  FINDINGS: There is no evidence of fracture or other focal bone lesions. Soft tissues are unremarkable. Vascular calcifications.  IMPRESSION: Negative.   Electronically Signed   By: Burman Nieves M.D.   On: 04/16/2014 04:30   Dg Chest Portable 1 View  04/26/2014   CLINICAL DATA:  Weakness.  Atrial fibrillation  EXAM: PORTABLE CHEST - 1 VIEW  COMPARISON:  12/30/2010  FINDINGS: Mild cardiac enlargement. Vascularity is normal.  Negative for edema  Small bilateral pleural effusions and mild left lower lobe atelectasis. Negative for pneumonia.  IMPRESSION: Small bilateral effusions and mild bibasilar atelectasis. Negative for edema.   Electronically Signed   By: Marlan Palau M.D.   On: 04/26/2014 11:49    CBC  Recent Labs Lab 04/26/14 1112 04/27/14 0703  WBC 10.0 7.5  HGB 10.3* 9.7*  HCT 30.6* 28.8*  PLT 280 257  MCV 91.9 90.3  MCH 30.9 30.4  MCHC 33.7 33.7  RDW 14.4 14.0  LYMPHSABS 0.9  --   MONOABS 0.9  --   EOSABS 0.0  --   BASOSABS 0.0  --     Chemistries   Recent Labs Lab 04/26/14 1112 04/26/14 1524 04/27/14 0703  NA 137  --  134*  K 3.9  --  3.4*  CL 99  --  95*  CO2 28  --  28  GLUCOSE 128*  --  103*  BUN 24*  --  27*  CREATININE 1.21*  --  1.37*  CALCIUM 8.8  --  8.8  MG  --  1.9  --   AST  --   --  34  ALT  --   --  15  ALKPHOS  --   --  101  BILITOT  --   --  0.2*   ------------------------------------------------------------------------------------------------------------------ estimated creatinine clearance is 17.3 ml/min (by C-G formula based on Cr of 1.37). ------------------------------------------------------------------------------------------------------------------ No results found for this basename: HGBA1C,  in the last 72 hours ------------------------------------------------------------------------------------------------------------------ No results found for this basename: CHOL, HDL, LDLCALC, TRIG, CHOLHDL, LDLDIRECT,  in the last 72 hours ------------------------------------------------------------------------------------------------------------------  Recent Labs  04/26/14 1524  TSH 0.181*   ------------------------------------------------------------------------------------------------------------------  Recent Labs  04/26/14 1524  VITAMINB12 867  FOLATE >20.0  FERRITIN 147  TIBC Not calculated due to Iron <10.  IRON <10*  RETICCTPCT 1.2     Coagulation profile No results found for this basename: INR, PROTIME,  in the last 168 hours  No results found for this basename: DDIMER,  in the last 72 hours  Cardiac Enzymes  Recent Labs Lab 04/26/14 1112  TROPONINI <0.30   ------------------------------------------------------------------------------------------------------------------ No components found with this basename: POCBNP,     Cyril Woodmansee D.O. on 04/27/2014 at 8:21 AM  Between 7am to 7pm - Pager - [phone]  After 7pm go to [email or web address] - password [email or web address]  And look for the night coverage person covering for me after hours  [facility] Office  [phone]

## 2014-04-28 DIAGNOSIS — T148XXA Other injury of unspecified body region, initial encounter: Secondary | ICD-10-CM

## 2014-04-28 LAB — CBC
HCT: 29.4 % — ABNORMAL LOW (ref 36.0–46.0)
HEMOGLOBIN: 9.9 g/dL — AB (ref 12.0–15.0)
MCH: 30.3 pg (ref 26.0–34.0)
MCHC: 33.7 g/dL (ref 30.0–36.0)
MCV: 89.9 fL (ref 78.0–100.0)
Platelets: 293 10*3/uL (ref 150–400)
RBC: 3.27 MIL/uL — ABNORMAL LOW (ref 3.87–5.11)
RDW: 14 % (ref 11.5–15.5)
WBC: 7.7 10*3/uL (ref 4.0–10.5)

## 2014-04-28 LAB — BASIC METABOLIC PANEL
Anion gap: 11 (ref 5–15)
BUN: 30 mg/dL — ABNORMAL HIGH (ref 6–23)
CHLORIDE: 98 meq/L (ref 96–112)
CO2: 26 mEq/L (ref 19–32)
CREATININE: 1.41 mg/dL — AB (ref 0.50–1.10)
Calcium: 8.7 mg/dL (ref 8.4–10.5)
GFR calc Af Amer: 37 mL/min — ABNORMAL LOW (ref >90)
GFR calc non Af Amer: 32 mL/min — ABNORMAL LOW (ref >90)
GLUCOSE: 126 mg/dL — AB (ref 70–99)
POTASSIUM: 4.2 meq/L (ref 3.7–5.3)
Sodium: 135 mEq/L — ABNORMAL LOW (ref 137–147)

## 2014-04-28 LAB — T4, FREE: FREE T4: 1.14 ng/dL (ref 0.80–1.80)

## 2014-04-28 MED ORDER — ONDANSETRON 4 MG PO TBDP
4.0000 mg | ORAL_TABLET | Freq: Four times a day (QID) | ORAL | Status: DC | PRN
  Administered 2014-04-28: 4 mg via ORAL

## 2014-04-28 MED ORDER — CLONIDINE HCL 0.1 MG PO TABS
0.1000 mg | ORAL_TABLET | Freq: Every day | ORAL | Status: DC
  Administered 2014-04-28 – 2014-04-29 (×2): 0.1 mg via ORAL
  Filled 2014-04-28 (×2): qty 1

## 2014-04-28 MED ORDER — ACETAMINOPHEN 650 MG RE SUPP
650.0000 mg | Freq: Four times a day (QID) | RECTAL | Status: DC | PRN
  Administered 2014-04-28: 650 mg via RECTAL
  Filled 2014-04-28: qty 1

## 2014-04-28 MED ORDER — DOXYCYCLINE HYCLATE 100 MG PO TABS
100.0000 mg | ORAL_TABLET | Freq: Two times a day (BID) | ORAL | Status: DC
  Administered 2014-04-28 – 2014-04-29 (×2): 100 mg via ORAL
  Filled 2014-04-28 (×2): qty 1

## 2014-04-28 MED ORDER — ONDANSETRON HCL 4 MG PO TABS: 4.0000 mg | ORAL_TABLET | Freq: Once | ORAL | Status: DC

## 2014-04-28 NOTE — Progress Notes (Signed)
[facility]                                                                              Patient Demographics  [name], is a 80 y.o. female, DOB - 02-08-1920, AVW:[id number]  Admit date - 04/26/2014   Admitting Physician Edsel Petrin, DO  Outpatient Primary MD for the patient is Milinda Antis, MD  LOS - 2   Chief Complaint  Patient presents with  . Extremity Weakness      HPI: [name] is a 80 y.o. 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 Patient states that her weakness has been ongoing for the last several days. She was accompanied by her granddaughter who also stated that she has not been herself lately. Patient admitted to hitting her leg a few days ago, and coming to the emergency department on 04/16/2014 after falling out of bed. Patient was seen and sent home. She was to follow up with Dr. Tanda Rockers for wound care. Again over the last several days, patient has had weakness, she was seen by her home health nurse today and was found to have a rapid heart rate. Patient was then sent to the emergency department. During admission, patient denied any chest pain shortness of breath, abdominal pain, nausea, vomiting, diarrhea, recent travel, or sick contacts.   Assessment & Plan   Atrial fibrillation with rapid ventricular response  -Patient was initially placed on Cardizem drip in the emergency department and continued with little improvement in her HR -Cardiology was consulted and appreciated and placed patient on amiodarone drip which was discontinued on 7/3, and patient was transitioned to PO  -Will continue Amiodarone 200mg  PO BID for 3 weeks, and then daily thereafter. -Magnesium and phosphate levels WNL  -TSH 0.181 -Echocardiogram: EF 55-60%, grade 2 diastolic dysfunction -Will continue aspirin -Patient is not a candidate for  anticoagulation due to history of falls  Generalized weakness  -TSH 0.181, Vit D 25 hydroxy 67, Vit B12 867, folate >20  -Possibly related to patient's atrial fibrillation with rapid ventricular response vs UTI -Chest x-ray showed small bowel effusions and mild bibasilar atelectasis, negative for edema or pneumonia  -PT consulted, and pending evaluation  -CM and SW consulted for possible placement  Urinary tract infection -UA: WBC TNTC, Large leukocytes, many bacteria -Will continue doxycycline -pending urine culture  Right lower extremity cellulitis  -Will place patient on doxycycline  -Currently afebrile no leukocytosis  -wound care consulted  Normocytic Anemia  -Baseline hemoglobin appears to be 12  -Continues to drop, Hb 9.9 -Anemia panel: Iron <10, Ferritin  147 -Pending FOBT -Continue iron supplementation   Chronic kidney disease, stage III  -Creatinine appears to be at baseline, continue to monitor BMP   Hypothyroidism  -TSH 0.181, Free T4 1.14 -Continue Synthroid at current dose  Peripheral neuropathy  -Continue gabapentin   Constipation  -Continue miralax  Depression  -Continue Effexor   Peripheral arterial disease  -Continue aspirin   Hypertension  -Stable continue amlodipine, Lasix, clonidine   Code Status: DNR   Family Communication: Friend at bedside  Disposition Plan: Admitted.  Will transfer patient to medical floor today.  Likely discharge to SNF 7/5.  Time Spent in minutes   25 minutes  Procedures  Echocardiogram Impressions: Mild LVH with LVEF 55-60%, grade 2 diastolic dysfunction. Mild left atrial enlargement. Mild mitral regurgitation. Mildly sclerotic aortic valve with trivial aortic regurgitation. Mild tricuspid regurgitation with PASP 49 mmHg.  Consults   Cardiology  DVT Prophylaxis  Heparin  Lab Results  Component Value Date   PLT 293 04/28/2014    Medications  Scheduled Meds: . amiodarone  200 mg Oral BID  . amLODipine   5 mg Oral Daily  . aspirin EC  81 mg Oral BH-q7a  . collagenase   Topical Daily  . doxycycline (VIBRAMYCIN) IV  100 mg Intravenous Q12H  . ferrous sulfate  325 mg Oral BID WC  . gabapentin  300 mg Oral Daily  . gabapentin  600 mg Oral QHS  . heparin  5,000 Units Subcutaneous 3 times per day  . levothyroxine  50 mcg Oral QAC breakfast  . mirabegron ER  25 mg Oral Daily  . multivitamin with minerals  1 tablet Oral BH-q7a  . polyethylene glycol  17 g Oral Daily  . sodium chloride  3 mL Intravenous Q12H  . venlafaxine  37.5 mg Oral Daily   Continuous Infusions: . amiodarone Stopped (04/27/14 2200)   PRN Meds:.sodium chloride, albuterol, sodium chloride, traMADol  Antibiotics    Anti-infectives   Start     Dose/Rate Route Frequency Ordered Stop   04/26/14 1500  doxycycline (VIBRAMYCIN) 100 mg in dextrose 5 % 250 mL IVPB     100 mg 125 mL/hr over 120 Minutes Intravenous Every 12 hours 04/26/14 1351     04/26/14 1130  doxycycline (VIBRA-TABS) tablet 100 mg     100 mg Oral  Once 04/26/14 1122 04/26/14 1131        Subjective:   [name] seen and examined today.  Patient states she is feeling better overall, however, she feels tired because her sleep is constantly interrupted. Patient did have a bowel movement yesterday.  She denies dizziness, chest pain, shortness of breath, abdominal pain, N/V/D/, new weakness, numbess, tingling.    Objective:   Filed Vitals:   04/28/14 0600 04/28/14 0700 04/28/14 0731 04/28/14 0800  BP: 158/62 161/87  170/71  Pulse: 76 85  76  Temp:   98.6 F (37 C)   TempSrc:   Oral   Resp: 18 23  16   Height:      Weight:      SpO2: 88% 88%  94%    Wt Readings from Last 3 Encounters:  04/27/14 49 kg (108 lb 0.4 oz)  04/17/14 46.72 kg (103 lb)  04/16/14 47.174 kg (104 lb)     Intake/Output Summary (Last 24 hours) at 04/28/14 0827 Last data filed at 04/28/14 0416  Gross per 24 hour  Intake 1514.4 ml  Output      0 ml  Net 1514.4 ml    Exam General: Well developed, frail, NAD, appears stated age  HEENT: NCAT, mucous membranes moist.  Neck: Supple, no JVD, no masses  Cardiovascular: S1 S2 auscultated, RRR  Respiratory: Clear to auscultation bilaterally with equal chest rise  Abdomen: Soft, nontender, nondistended, + bowel sounds  Extremities: warm dry without cyanosis clubbing or edema  Neuro: AAOx3, No focal deficits Skin: RLE- wrapped. LLE bruising with color change  Psych: Normal affect and demeanor with intact judgement and insight  Data Review   Micro Results Recent  Results (from the past 240 hour(s))  MRSA PCR SCREENING     Status: None   Collection Time    04/26/14  1:53 PM      Result Value Ref Range Status   MRSA by PCR NEGATIVE  NEGATIVE Final   Comment:            The GeneXpert MRSA Assay (FDA     approved for NASAL specimens     only), is one component of a     comprehensive MRSA colonization     surveillance program. It is not     intended to diagnose MRSA     infection nor to guide or     monitor treatment for     MRSA infections.    Radiology Reports Dg Femur Right  04/16/2014   CLINICAL DATA:  Right hip pain and distal tibial skin tear after fall out of bed.  EXAM: RIGHT FEMUR - 2 VIEW  COMPARISON:  Right hip 04/26/2008  FINDINGS: Postoperative changes with short intra medullary rod and compression screw fixation of the right proximal femur. Old healed fracture deformity of the inner trochanteric region femoral neck. Periosteal reaction around the distal locking screw. No evidence of acute fracture or dislocation. Distal right femur is unremarkable. Vascular calcifications.  IMPRESSION: Old postoperative/posttraumatic changes in the right hip. No acute fractures demonstrated.   Electronically Signed   By: Burman Nieves M.D.   On: 04/16/2014 04:29   Dg Tibia/fibula Right  04/16/2014   CLINICAL DATA:  Right hip pain in skin tear after fall out of bed.  EXAM: RIGHT TIBIA AND FIBULA - 2 VIEW   COMPARISON:  None.  FINDINGS: There is no evidence of fracture or other focal bone lesions. Soft tissues are unremarkable. Vascular calcifications.  IMPRESSION: Negative.   Electronically Signed   By: Burman Nieves M.D.   On: 04/16/2014 04:30   Dg Chest Portable 1 View  04/26/2014   CLINICAL DATA:  Weakness.  Atrial fibrillation  EXAM: PORTABLE CHEST - 1 VIEW  COMPARISON:  12/30/2010  FINDINGS: Mild cardiac enlargement. Vascularity is normal. Negative for edema  Small bilateral pleural effusions and mild left lower lobe atelectasis. Negative for pneumonia.  IMPRESSION: Small bilateral effusions and mild bibasilar atelectasis. Negative for edema.   Electronically Signed   By: Marlan Palau M.D.   On: 04/26/2014 11:49    CBC  Recent Labs Lab 04/26/14 1112 04/27/14 0703 04/28/14 0455  WBC 10.0 7.5 7.7  HGB 10.3* 9.7* 9.9*  HCT 30.6* 28.8* 29.4*  PLT 280 257 293  MCV 91.9 90.3 89.9  MCH 30.9 30.4 30.3  MCHC 33.7 33.7 33.7  RDW 14.4 14.0 14.0  LYMPHSABS 0.9  --   --   MONOABS 0.9  --   --   EOSABS 0.0  --   --   BASOSABS 0.0  --   --     Chemistries   Recent Labs Lab 04/26/14 1112 04/26/14 1524 04/27/14 0703 04/28/14 0455  NA 137  --  134* 135*  K 3.9  --  3.4* 4.2  CL 99  --  95* 98  CO2 28  --  28 26  GLUCOSE 128*  --  103* 126*  BUN 24*  --  27* 30*  CREATININE 1.21*  --  1.37* 1.41*  CALCIUM 8.8  --  8.8 8.7  MG  --  1.9  --   --   AST  --   --  34  --  ALT  --   --  15  --   ALKPHOS  --   --  101  --   BILITOT  --   --  0.2*  --    ------------------------------------------------------------------------------------------------------------------ estimated creatinine clearance is 16.8 ml/min (by C-G formula based on Cr of 1.41). ------------------------------------------------------------------------------------------------------------------ No results found for this basename: HGBA1C,  in the last 72  hours ------------------------------------------------------------------------------------------------------------------ No results found for this basename: CHOL, HDL, LDLCALC, TRIG, CHOLHDL, LDLDIRECT,  in the last 72 hours ------------------------------------------------------------------------------------------------------------------  Recent Labs  04/26/14 1524  TSH 0.181*   ------------------------------------------------------------------------------------------------------------------  Recent Labs  04/26/14 1524  VITAMINB12 867  FOLATE >20.0  FERRITIN 147  TIBC Not calculated due to Iron <10.  IRON <10*  RETICCTPCT 1.2    Coagulation profile No results found for this basename: INR, PROTIME,  in the last 168 hours  No results found for this basename: DDIMER,  in the last 72 hours  Cardiac Enzymes  Recent Labs Lab 04/26/14 1112  TROPONINI <0.30   ------------------------------------------------------------------------------------------------------------------ No components found with this basename: POCBNP,     [name] D.O. on 04/28/2014 at 8:27 AM  Between 7am to 7pm - Pager - [phone]  After 7pm go to [email or web address] - password [email or web address]  And look for the night coverage person covering for me after hours  [facility] Office  [phone]

## 2014-04-28 NOTE — Progress Notes (Signed)
Pt's IV began to leak this morning. IV removed and,  But nursing unsuccessful in obtaining  New access. MD notified she is agreeable to leaving IV out as pt is taking food, fluids, and meds PO. Will continue to monitor.

## 2014-04-28 NOTE — Progress Notes (Signed)
Informed by tech patient febrile at 101.9. Pt vomited when I entered room. Paged on call MDF. Orders given for PR tylenol and SL zofran. Patient has no IV access.

## 2014-04-28 NOTE — Progress Notes (Signed)
Pt transferring to 300 today. Pt is in no acute distress, VS are stable, all belongings will be transferred with pt. Receiving nurse has been given report and family is aware of pt transferring upstairs. Will continue to monitor.

## 2014-04-28 NOTE — Progress Notes (Signed)
PHARMACIST - PHYSICIAN COMMUNICATION DR:   Catha GosselinMikhail CONCERNING: Antibiotic IV to Oral Route Change Policy  RECOMMENDATION: This patient is receiving Doxycycline by the intravenous route.  Based on criteria approved by the Pharmacy and Therapeutics Committee, the antibiotic(s) is/are being converted to the equivalent oral dose form(s).   DESCRIPTION: These criteria include:  Patient being treated for a respiratory tract infection, urinary tract infection, cellulitis or clostridium difficile associated diarrhea if on metronidazole  The patient is not neutropenic and does not exhibit a GI malabsorption state  The patient is eating (either orally or via tube) and/or has been taking other orally administered medications for a least 24 hours  The patient is improving clinically and has a Tmax < 100.5  If you have questions about this conversion, please contact the Pharmacy Department  [x]   [phone]( [phone] )  [facility] []   [phone]( [phone] )  [name]  []   [phone]( [phone] )  [facility] []   [phone]( [phone]  [facility]    Junita PushMichelle Trevaris Pennella, PharmD, BCPS 04/28/2014@10 :06 AM

## 2014-04-29 ENCOUNTER — Inpatient Hospital Stay (HOSPITAL_COMMUNITY): Payer: Medicare Other

## 2014-04-29 DIAGNOSIS — J189 Pneumonia, unspecified organism: Secondary | ICD-10-CM

## 2014-04-29 DIAGNOSIS — E860 Dehydration: Secondary | ICD-10-CM

## 2014-04-29 DIAGNOSIS — R509 Fever, unspecified: Secondary | ICD-10-CM

## 2014-04-29 DIAGNOSIS — I872 Venous insufficiency (chronic) (peripheral): Secondary | ICD-10-CM

## 2014-04-29 LAB — URINE CULTURE: Colony Count: >=100000

## 2014-04-29 LAB — CBC
HCT: 28.1 % — ABNORMAL LOW (ref 36.0–46.0)
Hemoglobin: 9.4 g/dL — ABNORMAL LOW (ref 12.0–15.0)
MCH: 30.2 pg (ref 26.0–34.0)
MCHC: 33.5 g/dL (ref 30.0–36.0)
MCV: 90.4 fL (ref 78.0–100.0)
PLATELETS: 270 10*3/uL (ref 150–400)
RBC: 3.11 MIL/uL — ABNORMAL LOW (ref 3.87–5.11)
RDW: 14.1 % (ref 11.5–15.5)
WBC: 5.8 10*3/uL (ref 4.0–10.5)

## 2014-04-29 LAB — BASIC METABOLIC PANEL
ANION GAP: 14 (ref 5–15)
BUN: 27 mg/dL — ABNORMAL HIGH (ref 6–23)
CALCIUM: 9.1 mg/dL (ref 8.4–10.5)
CO2: 26 mEq/L (ref 19–32)
Chloride: 99 mEq/L (ref 96–112)
Creatinine, Ser: 1.27 mg/dL — ABNORMAL HIGH (ref 0.50–1.10)
GFR calc non Af Amer: 36 mL/min — ABNORMAL LOW (ref >90)
GFR, EST AFRICAN AMERICAN: 41 mL/min — AB (ref >90)
Glucose, Bld: 94 mg/dL (ref 70–99)
Potassium: 4.3 mEq/L (ref 3.7–5.3)
Sodium: 139 mEq/L (ref 137–147)

## 2014-04-29 MED ORDER — DEXTROSE 5 % IV SOLN
1.0000 g | INTRAVENOUS | Status: DC
  Administered 2014-04-29: 1 g via INTRAVENOUS
  Filled 2014-04-29 (×2): qty 1

## 2014-04-29 MED ORDER — VANCOMYCIN HCL 500 MG IV SOLR
INTRAVENOUS | Status: AC
  Filled 2014-04-29: qty 500

## 2014-04-29 MED ORDER — FUROSEMIDE 20 MG PO TABS
20.0000 mg | ORAL_TABLET | Freq: Every day | ORAL | Status: DC
  Administered 2014-04-29 – 2014-04-30 (×2): 20 mg via ORAL
  Filled 2014-04-29 (×2): qty 1

## 2014-04-29 MED ORDER — SODIUM CHLORIDE 0.9 % IV SOLN
500.0000 mg | INTRAVENOUS | Status: DC
  Administered 2014-04-29: 500 mg via INTRAVENOUS
  Filled 2014-04-29: qty 500

## 2014-04-29 MED ORDER — DEXTROSE 5 % IV SOLN
INTRAVENOUS | Status: AC
  Filled 2014-04-29: qty 1

## 2014-04-29 NOTE — Telephone Encounter (Signed)
noted 

## 2014-04-29 NOTE — Progress Notes (Signed)
ANTIBIOTIC CONSULT NOTE - INITIAL  Pharmacy Consult for Vancomycin & Cefepime Indication: pneumonia  Allergies  Allergen Reactions  . Acetaminophen-Codeine     REACTION: causes nausea  . Sulfa Antibiotics Other (See Comments)    Makes her 'sick'    Patient Measurements: Height: 4\' 10"  (147.3 cm) Weight: 108 lb 0.4 oz (49 kg) IBW/kg (Calculated) : 40.9  Vital Signs: Temp: 97.8 F (36.6 C) (07/05 1455) Temp src: Oral (07/05 0521) BP: 113/55 mmHg (07/05 1455) Pulse Rate: 69 (07/05 1455) Intake/Output from previous day:   Intake/Output from this shift: Total I/O In: 240 [P.O.:240] Out: -   Labs:  Recent Labs  04/27/14 0703 04/28/14 0455 04/29/14 0810  WBC 7.5 7.7 5.8  HGB 9.7* 9.9* 9.4*  PLT 257 293 270  CREATININE 1.37* 1.41* 1.27*   Estimated Creatinine Clearance: 18.6 ml/min (by C-G formula based on Cr of 1.27). No results found for this basename: VANCOTROUGH, Leodis BinetVANCOPEAK, VANCORANDOM, GENTTROUGH, GENTPEAK, GENTRANDOM, TOBRATROUGH, TOBRAPEAK, TOBRARND, AMIKACINPEAK, AMIKACINTROU, AMIKACIN,  in the last 72 hours   Microbiology: Recent Results (from the past 720 hour(s))  MRSA PCR SCREENING     Status: None   Collection Time    04/26/14  1:53 PM      Result Value Ref Range Status   MRSA by PCR NEGATIVE  NEGATIVE Final   Comment:            The GeneXpert MRSA Assay (FDA     approved for NASAL specimens     only), is one component of a     comprehensive MRSA colonization     surveillance program. It is not     intended to diagnose MRSA     infection nor to guide or     monitor treatment for     MRSA infections.  URINE CULTURE     Status: None   Collection Time    04/26/14  5:47 PM      Result Value Ref Range Status   Specimen Description URINE, RANDOM   Final   Special Requests NONE   Final   Culture  Setup Time     Final   Value: 04/27/2014 23:21     Performed at [facility]   Colony Count     Final   Value: >=100,000 COLONIES/ML   [facility]formed at [facility]   Culture     Final   Value: GRAM POSITIVE COCCI     Performed at [facility]   Report Status PENDING   Incomplete  CULTURE, BLOOD (ROUTINE X 2)     Status: None   Collection Time    04/28/14  9:07 PM      Result Value Ref Range Status   Specimen Description LEFT ANTECUBITAL   Final   Special Requests BOTTLES DRAWN AEROBIC ONLY 6CC   Final   Culture NO GROWTH 1 DAY   Final   Report Status PENDING   Incomplete  CULTURE, BLOOD (ROUTINE X 2)     Status: None   Collection Time    04/28/14  9:07 PM      Result Value Ref Range Status   Specimen Description RIGHT ANTECUBITAL   Final   Special Requests BOTTLES DRAWN AEROBIC AND ANAEROBIC 14CC   Final   Culture NO GROWTH 1 DAY   Final   Report Status PENDING   Incomplete    Medical History: Past Medical History  Diagnosis Date  . Hypertension   . Thyroid disease   .  Depression   . Cataract   . Osteoporosis   . IBS (irritable bowel syndrome)   . Anemia   . Hearing loss   . Bilateral leg edema   . Stasis ulcer of lower extremity     followed by wound care  . Peripheral neuropathy   . Critical lower limb ischemia   . Arthritis     Medications:  Scheduled:  . amiodarone  200 mg Oral BID  . amLODipine  5 mg Oral Daily  . aspirin EC  81 mg Oral BH-q7a  . ceFEPime (MAXIPIME) IV  1 g Intravenous Q24H  . cloNIDine  0.1 mg Oral QHS  . collagenase   Topical Daily  . ferrous sulfate  325 mg Oral BID WC  . furosemide  20 mg Oral Daily  . gabapentin  300 mg Oral Daily  . gabapentin  600 mg Oral QHS  . heparin  5,000 Units Subcutaneous 3 times per day  . levothyroxine  50 mcg Oral QAC breakfast  . mirabegron ER  25 mg Oral Daily  . multivitamin with minerals  1 tablet Oral BH-q7a  . polyethylene glycol  17 g Oral Daily  . sodium chloride  3 mL Intravenous Q12H  . vancomycin  500 mg Intravenous Q48H  . venlafaxine  37.5 mg Oral Daily   Assessment: 80 yo F admitted 04/26/14 with  generalized weakness and Afib.  She was placed on doxycycline at that time for cellulitis & UTI.  She spiked fever today.  CXR + PNA.  Urine cx growing GPCC.  Blood cx drawn today.   She has Stage 3 CKD.  Renal function is at patient's baseline.   Cefepime 7/5>> Vancomycin 7/5>> Doxycycline 7/2>>7/5  Goal of Therapy:  Vancomycin trough level 15-20 mcg/ml  Plan:  Cefepime 1gm IV q24h Vancomycin 500mg  IV q48h Check Vancomycin trough at steady state Monitor renal function and cx data   Elson ClanLilliston, Eldrick Penick Michelle 04/29/2014,4:24 PM

## 2014-04-29 NOTE — Progress Notes (Signed)
[facility]                                                                              Patient Demographics  [name], is a 80 y.o. female, DOB - 05-22-1920, ZOX:[id number]  Admit date - 04/26/2014   Admitting Physician Edsel Petrin, DO  Outpatient Primary MD for the patient is Milinda Antis, MD  LOS - 3   Chief Complaint  Patient presents with  . Extremity Weakness      HPI: [name] is a 80 y.o. 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 Patient states that her weakness has been ongoing for the last several days. She was accompanied by her granddaughter who also stated that she has not been herself lately. Patient admitted to hitting her leg a few days ago, and coming to the emergency department on 04/16/2014 after falling out of bed. Patient was seen and sent home. She was to follow up with Dr. Tanda Rockers for wound care. Again over the last several days, patient has had weakness, she was seen by her home health nurse today and was found to have a rapid heart rate. Patient was then sent to the emergency department. During admission, patient denied any chest pain shortness of breath, abdominal pain, nausea, vomiting, diarrhea, recent travel, or sick contacts.   Assessment & Plan   Fever possible secondary to UTI vs HCAP -Patient had fever of 101.9 overnight -CXR: ?Superimposed pneumoinia -UTI, patient on doxycycline -urine culture shows Gram + cocci -Will start patient on vanc and cefepime for HCAP  -Blood cultures pending  Diastolic CHF -Echo showed grade 2 diastolic dysfunction -Will restart lasix today  Atrial fibrillation with rapid ventricular response  -Patient was initially placed on Cardizem drip in the emergency department and continued with little improvement in her HR -Cardiology was consulted and appreciated and placed patient on  amiodarone drip which was discontinued on 7/3, and patient was transitioned to PO  -Will continue Amiodarone 200mg  PO BID for 3 weeks, and then daily thereafter. -Magnesium and phosphate levels WNL  -TSH 0.181 -Echocardiogram: EF 55-60%, grade 2 diastolic dysfunction -Will continue aspirin -Patient is not a candidate for anticoagulation due to history of falls -Patient converted to sinus rhythm  Generalized weakness  -TSH 0.181, Vit D 25 hydroxy 67, Vit B12 867, folate >20  -Possibly related to patient's atrial fibrillation with rapid ventricular response vs UTI -Chest x-ray showed small bowel effusions and mild bibasilar atelectasis, negative for edema or pneumonia  -PT consulted, and pending evaluation  -CM and SW consulted for possible placement  Urinary tract infection -UA: WBC TNTC, Large leukocytes, many bacteria -Will continue doxycycline -Urine culture shows Gram + cocci -No abdominal pain  Right lower extremity cellulitis  -Will place patient on doxycycline  -No leukocytosis  -wound care consulted  Normocytic Anemia  -Baseline hemoglobin appears to be 12  -Continues to drop, Hb 9.4 -Anemia panel: Iron <10, Ferritin  147 -Pending FOBT -Continue iron supplementation   Chronic kidney disease, stage III  -Creatinine appears to be at baseline, continue to monitor BMP  Hypothyroidism  -TSH 0.181, Free T4 1.14 -Continue Synthroid at current dose  Peripheral neuropathy  -Continue gabapentin   Constipation  -Continue miralax  Depression  -Continue Effexor   Peripheral arterial disease  -Continue aspirin   Hypertension  -Stable continue amlodipine, Lasix, clonidine   Code Status: DNR   Family Communication: Friend at bedside  Disposition Plan: Admitted.   Time Spent in minutes   25 minutes  Procedures  Echocardiogram Impressions: Mild LVH with LVEF 55-60%, grade 2 diastolic dysfunction. Mild left atrial enlargement. Mild mitral regurgitation. Mildly  sclerotic aortic valve with trivial aortic regurgitation. Mild tricuspid regurgitation with PASP 49 mmHg.  Consults   Cardiology  DVT Prophylaxis  Heparin  Lab Results  Component Value Date   PLT 270 04/29/2014    Medications  Scheduled Meds: . amiodarone  200 mg Oral BID  . amLODipine  5 mg Oral Daily  . aspirin EC  81 mg Oral BH-q7a  . cloNIDine  0.1 mg Oral QHS  . collagenase   Topical Daily  . doxycycline  100 mg Oral Q12H  . ferrous sulfate  325 mg Oral BID WC  . furosemide  20 mg Oral Daily  . gabapentin  300 mg Oral Daily  . gabapentin  600 mg Oral QHS  . heparin  5,000 Units Subcutaneous 3 times per day  . levothyroxine  50 mcg Oral QAC breakfast  . mirabegron ER  25 mg Oral Daily  . multivitamin with minerals  1 tablet Oral BH-q7a  . polyethylene glycol  17 g Oral Daily  . sodium chloride  3 mL Intravenous Q12H  . venlafaxine  37.5 mg Oral Daily   Continuous Infusions:   PRN Meds:.sodium chloride, acetaminophen, albuterol, ondansetron, sodium chloride, traMADol  Antibiotics    Anti-infectives   Start     Dose/Rate Route Frequency Ordered Stop   04/28/14 1800  doxycycline (VIBRA-TABS) tablet 100 mg     100 mg Oral Every 12 hours 04/28/14 1007     04/26/14 1500  doxycycline (VIBRAMYCIN) 100 mg in dextrose 5 % 250 mL IVPB  Status:  Discontinued     100 mg 125 mL/hr over 120 Minutes Intravenous Every 12 hours 04/26/14 1351 04/28/14 1007   04/26/14 1130  doxycycline (VIBRA-TABS) tablet 100 mg     100 mg Oral  Once 04/26/14 1122 04/26/14 1131        Subjective:   [name] seen and examined today.  Patient states she is feeling better than last night.  She had an episode of nausea and vomiting.  She also had a fever.  Patient states her breathing is "a little worse than yesterday."  She denies any currently nausea or vomiting, but states she does not have much of an appetite.  Denies chest pain, abdominal pain.   Objective:   Filed Vitals:   04/28/14  2048 04/28/14 2321 04/29/14 0521 04/29/14 1105  BP: 152/55  110/38   Pulse: 97  62   Temp: 101.9 F (38.8 C) 99.5 F (37.5 C) 97.7 F (36.5 C)   TempSrc: Oral Oral Oral   Resp: 20  20   Height:      Weight:      SpO2: 92%  96% 93%    Wt Readings from Last 3 Encounters:  04/27/14 49 kg (108 lb 0.4 oz)  04/17/14 46.72 kg (103 lb)  04/16/14 47.174 kg (104 lb)    No intake or output data in the 24 hours ending 04/29/14 1126 Exam General:  Well developed, frail, NAD, appears stated age  HEENT: NCAT, mucous membranes moist.  Neck: Supple, no JVD, no masses  Cardiovascular: S1 S2 auscultated, RRR  Respiratory: Clear to auscultation bilaterally with equal chest rise  Abdomen: Soft, nontender, nondistended, + bowel sounds  Extremities: warm dry without cyanosis clubbing or edema  Neuro: AAOx3, No focal deficits Skin: RLE- wrapped. LLE bruising with color change  Psych: Normal affect and demeanor with intact judgement and insight  Data Review   Micro Results Recent Results (from the past 240 hour(s))  MRSA PCR SCREENING     Status: None   Collection Time    04/26/14  1:53 PM      Result Value Ref Range Status   MRSA by PCR NEGATIVE  NEGATIVE Final   Comment:            The GeneXpert MRSA Assay (FDA     approved for NASAL specimens     only), is one component of a     comprehensive MRSA colonization     surveillance program. It is not     intended to diagnose MRSA     infection nor to guide or     monitor treatment for     MRSA infections.  URINE CULTURE     Status: None   Collection Time    04/26/14  5:47 PM      Result Value Ref Range Status   Specimen Description URINE, RANDOM   Final   Special Requests NONE   Final   Culture  Setup Time     Final   Value: 04/27/2014 23:21     Performed at [facility] Count     Final   Value: >=100,000 COLONIES/ML     Performed at [facility]   Culture     Final   Value: GRAM POSITIVE COCCI      Performed at [facility]   Report Status PENDING   Incomplete  CULTURE, BLOOD (ROUTINE X 2)     Status: None   Collection Time    04/28/14  9:07 PM      Result Value Ref Range Status   Specimen Description LEFT ANTECUBITAL   Final   Special Requests BOTTLES DRAWN AEROBIC ONLY 6CC   Final   Culture NO GROWTH 1 DAY   Final   Report Status PENDING   Incomplete  CULTURE, BLOOD (ROUTINE X 2)     Status: None   Collection Time    04/28/14  9:07 PM      Result Value Ref Range Status   Specimen Description RIGHT ANTECUBITAL   Final   Special Requests BOTTLES DRAWN AEROBIC AND ANAEROBIC 14CC   Final   Culture NO GROWTH 1 DAY   Final   Report Status PENDING   Incomplete    Radiology Reports Dg Femur Right  04/16/2014   CLINICAL DATA:  Right hip pain and distal tibial skin tear after fall out of bed.  EXAM: RIGHT FEMUR - 2 VIEW  COMPARISON:  Right hip 04/26/2008  FINDINGS: Postoperative changes with short intra medullary rod and compression screw fixation of the right proximal femur. Old healed fracture deformity of the inner trochanteric region femoral neck. Periosteal reaction around the distal locking screw. No evidence of acute fracture or dislocation. Distal right femur is unremarkable. Vascular calcifications.  IMPRESSION: Old postoperative/posttraumatic changes in the right hip. No acute fractures demonstrated.   Electronically Signed   By: Chrissie Noa  Andria MeuseStevens M.D.   On: 04/16/2014 04:29   Dg Tibia/fibula Right  04/16/2014   CLINICAL DATA:  Right hip pain in skin tear after fall out of bed.  EXAM: RIGHT TIBIA AND FIBULA - 2 VIEW  COMPARISON:  None.  FINDINGS: There is no evidence of fracture or other focal bone lesions. Soft tissues are unremarkable. Vascular calcifications.  IMPRESSION: Negative.   Electronically Signed   By: Burman NievesWilliam  Stevens M.D.   On: 04/16/2014 04:30   Dg Chest Portable 1 View  04/26/2014   CLINICAL DATA:  Weakness.  Atrial fibrillation  EXAM: PORTABLE CHEST - 1 VIEW   COMPARISON:  12/30/2010  FINDINGS: Mild cardiac enlargement. Vascularity is normal. Negative for edema  Small bilateral pleural effusions and mild left lower lobe atelectasis. Negative for pneumonia.  IMPRESSION: Small bilateral effusions and mild bibasilar atelectasis. Negative for edema.   Electronically Signed   By: Marlan Palauharles  Clark M.D.   On: 04/26/2014 11:49    CBC  Recent Labs Lab 04/26/14 1112 04/27/14 0703 04/28/14 0455 04/29/14 0810  WBC 10.0 7.5 7.7 5.8  HGB 10.3* 9.7* 9.9* 9.4*  HCT 30.6* 28.8* 29.4* 28.1*  PLT 280 257 293 270  MCV 91.9 90.3 89.9 90.4  MCH 30.9 30.4 30.3 30.2  MCHC 33.7 33.7 33.7 33.5  RDW 14.4 14.0 14.0 14.1  LYMPHSABS 0.9  --   --   --   MONOABS 0.9  --   --   --   EOSABS 0.0  --   --   --   BASOSABS 0.0  --   --   --     Chemistries   Recent Labs Lab 04/26/14 1112 04/26/14 1524 04/27/14 0703 04/28/14 0455 04/29/14 0810  NA 137  --  134* 135* 139  K 3.9  --  3.4* 4.2 4.3  CL 99  --  95* 98 99  CO2 28  --  28 26 26   GLUCOSE 128*  --  103* 126* 94  BUN 24*  --  27* 30* 27*  CREATININE 1.21*  --  1.37* 1.41* 1.27*  CALCIUM 8.8  --  8.8 8.7 9.1  MG  --  1.9  --   --   --   AST  --   --  34  --   --   ALT  --   --  15  --   --   ALKPHOS  --   --  101  --   --   BILITOT  --   --  0.2*  --   --    ------------------------------------------------------------------------------------------------------------------ estimated creatinine clearance is 18.6 ml/min (by C-G formula based on Cr of 1.27). ------------------------------------------------------------------------------------------------------------------ No results found for this basename: HGBA1C,  in the last 72 hours ------------------------------------------------------------------------------------------------------------------ No results found for this basename: CHOL, HDL, LDLCALC, TRIG, CHOLHDL, LDLDIRECT,  in the last 72  hours ------------------------------------------------------------------------------------------------------------------  Recent Labs  04/26/14 1524  TSH 0.181*   ------------------------------------------------------------------------------------------------------------------  Recent Labs  04/26/14 1524  VITAMINB12 867  FOLATE >20.0  FERRITIN 147  TIBC Not calculated due to Iron <10.  IRON <10*  RETICCTPCT 1.2    Coagulation profile No results found for this basename: INR, PROTIME,  in the last 168 hours  No results found for this basename: DDIMER,  in the last 72 hours  Cardiac Enzymes  Recent Labs Lab 04/26/14 1112  TROPONINI <0.30   ------------------------------------------------------------------------------------------------------------------ No components found with this basename: POCBNP,     Francee Setzer D.O. on 04/29/2014 at  11:26 AM  Between 7am to 7pm - Pager - [phone]  After 7pm go to [email or web address] - password [email or web address]  And look for the night coverage person covering for me after hours  [facility] Office  [phone]

## 2014-04-29 NOTE — Telephone Encounter (Signed)
Noted, pt now inpatient , a fib RVR, will call and discuss with son at discharge to see what next best step is

## 2014-04-30 ENCOUNTER — Inpatient Hospital Stay
Admission: RE | Admit: 2014-04-30 | Discharge: 2014-06-28 | Disposition: A | Discharge status: Other Acute Inpatient Hospital | Payer: Medicare Other | Source: Ambulatory Visit | Attending: Internal Medicine | Admitting: Internal Medicine

## 2014-04-30 DIAGNOSIS — R52 Pain, unspecified: Principal | ICD-10-CM

## 2014-04-30 DIAGNOSIS — R269 Unspecified abnormalities of gait and mobility: Secondary | ICD-10-CM

## 2014-04-30 LAB — BASIC METABOLIC PANEL
Anion gap: 11 (ref 5–15)
BUN: 30 mg/dL — AB (ref 6–23)
CO2: 28 mEq/L (ref 19–32)
Calcium: 8.8 mg/dL (ref 8.4–10.5)
Chloride: 100 mEq/L (ref 96–112)
Creatinine, Ser: 1.38 mg/dL — ABNORMAL HIGH (ref 0.50–1.10)
GFR calc Af Amer: 37 mL/min — ABNORMAL LOW (ref >90)
GFR, EST NON AFRICAN AMERICAN: 32 mL/min — AB (ref >90)
Glucose, Bld: 95 mg/dL (ref 70–99)
POTASSIUM: 4.6 meq/L (ref 3.7–5.3)
SODIUM: 139 meq/L (ref 137–147)

## 2014-04-30 LAB — CBC
HCT: 26.4 % — ABNORMAL LOW (ref 36.0–46.0)
Hemoglobin: 8.7 g/dL — ABNORMAL LOW (ref 12.0–15.0)
MCH: 30.1 pg (ref 26.0–34.0)
MCHC: 33 g/dL (ref 30.0–36.0)
MCV: 91.3 fL (ref 78.0–100.0)
Platelets: 302 10*3/uL (ref 150–400)
RBC: 2.89 MIL/uL — ABNORMAL LOW (ref 3.87–5.11)
RDW: 14.2 % (ref 11.5–15.5)
WBC: 6.3 10*3/uL (ref 4.0–10.5)

## 2014-04-30 MED ORDER — LEVOFLOXACIN 500 MG PO TABS: 500.0000 mg | ORAL_TABLET | ORAL | Status: DC

## 2014-04-30 MED ORDER — COLLAGENASE 250 UNIT/GM EX OINT: TOPICAL_OINTMENT | Freq: Every day | CUTANEOUS | Status: DC

## 2014-04-30 MED ORDER — AMIODARONE HCL 200 MG PO TABS: 200.0000 mg | ORAL_TABLET | Freq: Two times a day (BID) | ORAL | Status: AC

## 2014-04-30 MED ORDER — BIOTENE DRY MOUTH MT LIQD: 15.0000 mL | Freq: Two times a day (BID) | OROMUCOSAL | Status: DC

## 2014-04-30 MED ORDER — BIOTENE DRY MOUTH MT LIQD
15.0000 mL | Freq: Two times a day (BID) | OROMUCOSAL | Status: DC
  Administered 2014-04-30: 15 mL via OROMUCOSAL

## 2014-04-30 MED ORDER — POLYETHYLENE GLYCOL 3350 17 G PO PACK: 17.0000 g | PACK | Freq: Every day | ORAL | Status: DC

## 2014-04-30 MED ORDER — AMIODARONE HCL 200 MG PO TABS: 200.0000 mg | ORAL_TABLET | Freq: Every day | ORAL | Status: AC

## 2014-04-30 MED ORDER — FERROUS SULFATE 325 (65 FE) MG PO TABS: 325.0000 mg | ORAL_TABLET | Freq: Two times a day (BID) | ORAL | Status: AC

## 2014-04-30 MED ORDER — TRAMADOL HCL 50 MG PO TABS: ORAL_TABLET | ORAL | Status: DC

## 2014-04-30 MED ORDER — LEVOFLOXACIN 750 MG PO TABS
750.0000 mg | ORAL_TABLET | Freq: Once | ORAL | Status: AC
  Administered 2014-04-30: 750 mg via ORAL
  Filled 2014-04-30: qty 1

## 2014-04-30 NOTE — Progress Notes (Signed)
ANTIBIOTIC CONSULT NOTE  Pharmacy Consult for Renal dose abx adjustment Indication: pneumonia  Allergies  Allergen Reactions  . Acetaminophen-Codeine     REACTION: causes nausea  . Sulfa Antibiotics Other (See Comments)    Makes her 'sick'    Patient Measurements: Height: 4\' 10"  (147.3 cm) Weight: 108 lb 0.4 oz (49 kg) IBW/kg (Calculated) : 40.9  Vital Signs: Temp: 98.1 F (36.7 C) (07/06 0658) Temp src: Oral (07/06 0658) BP: 106/55 mmHg (07/06 1027) Pulse Rate: 57 (07/06 0658) Intake/Output from previous day: 07/05 0701 - 07/06 0700 In: 390 [P.O.:240; IV Piggyback:150] Out: -  Intake/Output from this shift:    Labs:  Recent Labs  04/28/14 0455 04/29/14 0810 04/30/14 0614  WBC 7.7 5.8 6.3  HGB 9.9* 9.4* 8.7*  PLT 293 270 302  CREATININE 1.41* 1.27* 1.38*   Estimated Creatinine Clearance: 17.1 ml/min (by C-G formula based on Cr of 1.38). No results found for this basename: VANCOTROUGH, Leodis BinetVANCOPEAK, VANCORANDOM, GENTTROUGH, GENTPEAK, GENTRANDOM, TOBRATROUGH, TOBRAPEAK, TOBRARND, AMIKACINPEAK, AMIKACINTROU, AMIKACIN,  in the last 72 hours   Microbiology: Recent Results (from the past 720 hour(s))  MRSA PCR SCREENING     Status: None   Collection Time    04/26/14  1:53 PM      Result Value Ref Range Status   MRSA by PCR NEGATIVE  NEGATIVE Final   Comment:            The GeneXpert MRSA Assay (FDA     approved for NASAL specimens     only), is one component of a     comprehensive MRSA colonization     surveillance program. It is not     intended to diagnose MRSA     infection nor to guide or     monitor treatment for     MRSA infections.  URINE CULTURE     Status: None   Collection Time    04/26/14  5:47 PM      Result Value Ref Range Status   Specimen Description URINE, RANDOM   Final   Special Requests NONE   Final   Culture  Setup Time     Final   Value: 04/27/2014 23:21     Performed at [facility]   Colony Count     Final   Value:  >=100,000 COLONIES/ML     [facility]formed at [facility]   Culture     Final   Value: ENTEROCOCCUS SPECIES     Performed at [facility]   Report Status 04/29/2014 FINAL   Final   Organism ID, Bacteria ENTEROCOCCUS SPECIES   Final  CULTURE, BLOOD (ROUTINE X 2)     Status: None   Collection Time    04/28/14  9:07 PM      Result Value Ref Range Status   Specimen Description LEFT ANTECUBITAL   Final   Special Requests BOTTLES DRAWN AEROBIC ONLY 6CC   Final   Culture NO GROWTH 1 DAY   Final   Report Status PENDING   Incomplete  CULTURE, BLOOD (ROUTINE X 2)     Status: None   Collection Time    04/28/14  9:07 PM      Result Value Ref Range Status   Specimen Description RIGHT ANTECUBITAL   Final   Special Requests BOTTLES DRAWN AEROBIC AND ANAEROBIC 14CC   Final   Culture NO GROWTH 1 DAY   Final   Report Status PENDING   Incomplete    Medical  History: Past Medical History  Diagnosis Date  . Hypertension   . Thyroid disease   . Depression   . Cataract   . Osteoporosis   . IBS (irritable bowel syndrome)   . Anemia   . Hearing loss   . Bilateral leg edema   . Stasis ulcer of lower extremity     followed by wound care  . Peripheral neuropathy   . Critical lower limb ischemia   . Arthritis     Medications:  Scheduled:  . amiodarone  200 mg Oral BID  . amLODipine  5 mg Oral Daily  . antiseptic oral rinse  15 mL Mouth Rinse BID  . aspirin EC  81 mg Oral BH-q7a  . cloNIDine  0.1 mg Oral QHS  . collagenase   Topical Daily  . ferrous sulfate  325 mg Oral BID WC  . furosemide  20 mg Oral Daily  . gabapentin  300 mg Oral Daily  . gabapentin  600 mg Oral QHS  . heparin  5,000 Units Subcutaneous 3 times per day  . [START ON 05/02/2014] levofloxacin  500 mg Oral Q48H  . levofloxacin  750 mg Oral Once  . levothyroxine  50 mcg Oral QAC breakfast  . mirabegron ER  25 mg Oral Daily  . multivitamin with minerals  1 tablet Oral BH-q7a  . polyethylene glycol  17 g Oral  Daily  . sodium chloride  3 mL Intravenous Q12H  . venlafaxine  37.5 mg Oral Daily   Assessment: 80 yo F admitted 04/26/14 with generalized weakness and Afib.  She was placed on doxycycline at that time for cellulitis & UTI.  She spiked fever & broader spectrum antibiotics started.  CXR + PNA.  Urine cx +enteroccocus.  Blood cx drawn- NGTD.   She has Stage 3 CKD.  Renal function is at patient's baseline.   Levaquin 7/6>> Cefepime 7/5>>7/6 Vancomycin 7/5>>7/6 Doxycycline 7/2>>7/5  Goal of Therapy:  Vancomycin trough level 15-20 mcg/ml  Plan:  Levaquin 750mg  po x1 then 500mg  po q48h Duration of therapy per MD Pharmacy to sign off.  Please re-consult as needed.   Elson ClanLilliston, Gwendolyn Mclees Michelle 04/30/2014,11:52 AM

## 2014-04-30 NOTE — Care Management Utilization Note (Signed)
UR completed 

## 2014-04-30 NOTE — Clinical Social Work Note (Signed)
[facility] now able to offer bed. Pt and son, Tammy HuaDavid notified and accept. Pt ready for d/c today. Will transfer with RN. D/C summary faxed.   Derenda FennelKara Brannan Cassedy, [place]CS[place] [phone]

## 2014-04-30 NOTE — Clinical Social Work Placement (Signed)
Clinical Social Work Department CLINICAL SOCIAL WORK PLACEMENT NOTE 04/30/2014  Patient:  [name],[name]  Account Number:  [id number] Admit date:  04/26/2014  Clinical Social Worker:  Derenda FennelKARA Adrion Menz, LCSW  Date/time:  04/27/2014 12:41 PM  Clinical Social Work is seeking post-discharge placement for this patient at the following level of care:   SKILLED NURSING   (*CSW will update this form in Epic as items are completed)   04/27/2014  [facility]family provided with [name] of Clinical Social Work's list of facilities offering this level of care within the geographic area requested by the patient (or if unable, by the patient's family).  04/27/2014  Patient/family informed of their freedom to choose among providers that offer the needed level of care, that participate in Medicare, Medicaid or managed care program needed by the patient, have an available bed and are willing to accept the patient.  04/27/2014  Patient/family informed of [facility]' ownership interest in [facility], as well as of the fact that they are under no obligation to receive care at this facility.  PASARR submitted to EDS on  PASARR number received on   FL2 transmitted to all facilities in geographic area requested by pt/family on  04/27/2014 FL2 transmitted to all facilities within larger geographic area on   Patient informed that his/her managed care company has contracts with or will negotiate with  certain facilities, including the following:     Patient/family informed of bed offers received:  04/27/2014 [facility] Physician [facility] and [facility]  [facility] on  04/30/2014 Patient to be transferred to facility by RN Patient and family notified of transfer on 04/30/2014 Name of family member notified:  Bunnie Dominoavid-son  The following physician request were entered in  Epic:   Additional Comments: pt has existing pasarr.  Derenda FennelKara Lenee Franze, [place] [phone]

## 2014-04-30 NOTE — Discharge Instructions (Signed)
Atrial Fibrillation  Atrial fibrillation is a type of irregular heart rhythm (arrhythmia). During atrial fibrillation, the upper chambers of the heart (atria) quiver continuously in a chaotic pattern. This causes an irregular and often rapid heart rate.   Atrial fibrillation is the result of the heart becoming overloaded with disorganized signals that tell it to beat. These signals are normally released one at a time by a part of the right atrium called the sinoatrial node. They then travel from the atria to the lower chambers of the heart (ventricles), causing the atria and ventricles to contract and pump blood as they pass. In atrial fibrillation, parts of the atria outside of the sinoatrial node also release these signals. This results in two problems. First, the atria receive so many signals that they do not have time to fully contract. Second, the ventricles, which can only receive one signal at a time, beat irregularly and out of rhythm with the atria.   There are three types of atrial fibrillation:   · Paroxysmal. Paroxysmal atrial fibrillation starts suddenly and stops on its own within a week.  · Persistent. Persistent atrial fibrillation lasts for more than a week. It may stop on its own or with treatment.  · Permanent. Permanent atrial fibrillation does not go away. Episodes of atrial fibrillation may lead to permanent atrial fibrillation.  Atrial fibrillation can prevent your heart from pumping blood normally. It increases your risk of stroke and can lead to heart failure.   CAUSES   · Heart conditions, including a heart attack, heart failure, coronary artery disease, and heart valve conditions.    · Inflammation of the sac that surrounds the heart (pericarditis).  · Blockage of an artery in the lungs (pulmonary embolism).  · Pneumonia or other infections.  · Chronic lung disease.  · Thyroid problems, especially if the thyroid is overactive (hyperthyroidism).  · Caffeine, excessive alcohol use, and use  of some illegal drugs.    · Use of some medicines, including certain decongestants and diet pills.  · Heart surgery.    · Birth defects.    Sometimes, no cause can be found. When this happens, the atrial fibrillation is called lone atrial fibrillation. The risk of complications from atrial fibrillation increases if you have lone atrial fibrillation and you are age 60 years or older.  RISK FACTORS  · Heart failure.  · Coronary artery disease.  · Diabetes mellitus.    · High blood pressure (hypertension).    · Obesity.    · Other arrhythmias.    · Increased age.  SYMPTOMS   · A feeling that your heart is beating rapidly or irregularly.    · A feeling of discomfort or pain in your chest.    · Shortness of breath.    · Sudden light-headedness or weakness.    · Getting tired easily when exercising.    · Urinating more often than normal (mainly when atrial fibrillation first begins).    In paroxysmal atrial fibrillation, symptoms may start and suddenly stop.  DIAGNOSIS   Your health care provider may be able to detect atrial fibrillation when taking your pulse. Your health care provider may have you take a test called an ambulatory electrocardiogram (ECG). An ECG records your heartbeat patterns over a 24-hour period. You may also have other tests, such as:  · Transthoracic echocardiogram (TTE). During echocardiography, sound waves are used to evaluate how blood flows through your heart.  · Transesophageal echocardiogram (TEE).  · Stress test. There is more than one type of stress test. If a stress test is   an overactive thyroid, treating the condition may correct atrial fibrillation.  Taking medicine. Medicines may be given to control a rapid heart rate or to prevent blood clots, heart  failure, or a stroke.  Having a procedure to correct the rhythm of the heart:  Electrical cardioversion. During electrical cardioversion, a controlled, low-energy shock is delivered to the heart through your skin. If you have chest pain, very low blood pressure, or sudden heart failure, this procedure may need to be done as an emergency.  Catheter ablation. During this procedure, heart tissues that send the signals that cause atrial fibrillation are destroyed.  Maze or minimaze procedure. During this surgery, thin lines of heart tissue that carry the abnormal signals are destroyed. The maze procedure is an open-heart surgery. The minimaze procedure is a minimally invasive surgery. This means that small cuts are made to access the heart instead of a large opening.  Pulmonary venous isolation. During this surgery, tissue around the veins that carry blood from the lungs (pulmonary veins) is destroyed. This tissue is thought to carry the abnormal signals. HOME CARE INSTRUCTIONS   Only take medicines that your health care provider approves, and take them as directed. Some medicines can make atrial fibrillation worse or recur.  If blood thinners were prescribed by your health care provider, take them exactly as directed. Too much blood-thinning medicine can cause bleeding. If you take too little, you will not have the needed protection against stroke and other problems.  Perform blood tests at home if directed by your health care provider. Perform blood tests exactly as directed.  Quit smoking if you smoke.  Do not drink alcohol.  Do not drink caffeinated beverages such as coffee, soda, and some teas. You may drink decaffeinated coffee, soda, or tea.   Maintain a healthy weight.Do not use diet pills unless your health care provider approves. They may make heart problems worse.   Follow diet instructions as directed by your health care provider.  Exercise regularly as directed by your health  care provider.  Keep all follow-up appointments with your health care provider. PREVENTION  The following substances can cause atrial fibrillation to recur:   Caffeinated beverages.  Alcohol.  Certain medicines, especially those used for breathing problems.  Certain herbs and herbal medicines, such as those containing ephedra or ginseng.  Illegal drugs, such as cocaine and amphetamines. Sometimes medicines are given to prevent atrial fibrillation from recurring. Proper treatment of any underlying condition is also important in helping prevent recurrence.  SEEK MEDICAL CARE IF:  You notice a change in the rate, rhythm, or strength of your heartbeat.  You suddenly begin urinating more frequently.  You tire more easily when exerting yourself or exercising. SEEK IMMEDIATE MEDICAL CARE IF:   You have chest pain, abdominal pain, sweating, or weakness.  You feel nauseous.  You have shortness of breath.  You suddenly have swollen feet and ankles.  You feel dizzy.  Your face or limbs feel numb or weak.  You have a change in your vision or speech. MAKE SURE YOU:   Understand these instructions.  Will watch your condition.  Will get help right away if you are not doing well or get worse. Document Released: 10/12/2005 Document Revised: 10/17/2013 Document Reviewed: 11/22/2012 [facility] Patient Information 2015 [place], [place]. This information is not intended to replace advice given to you by your health care provider. Make sure you discuss any questions you have with your health care provider. Pneumonia, Adult Pneumonia is an infection  of the lungs.  CAUSES Pneumonia may be caused by bacteria or a virus. Usually, these infections are caused by breathing infectious particles into the lungs (respiratory tract). SYMPTOMS   Cough.  Fever.  Chest pain.  Increased rate of breathing.  Wheezing.  Mucus production. DIAGNOSIS  If you have the common symptoms of pneumonia,  your caregiver will typically confirm the diagnosis with a chest X-ray. The X-ray will show an abnormality in the lung (pulmonary infiltrate) if you have pneumonia. Other tests of your blood, urine, or sputum may be done to find the specific cause of your pneumonia. Your caregiver may also do tests (blood gases or pulse oximetry) to see how well your lungs are working. TREATMENT  Some forms of pneumonia may be spread to other people when you cough or sneeze. You may be asked to wear a mask before and during your exam. Pneumonia that is caused by bacteria is treated with antibiotic medicine. Pneumonia that is caused by the influenza virus may be treated with an antiviral medicine. Most other viral infections must run their course. These infections will not respond to antibiotics.  PREVENTION A pneumococcal shot (vaccine) is available to prevent a common bacterial cause of pneumonia. This is usually suggested for:  People over 59 years old.  Patients on chemotherapy.  People with chronic lung problems, such as bronchitis or emphysema.  People with immune system problems. If you are over 65 or have a high risk condition, you may receive the pneumococcal vaccine if you have not received it before. In some countries, a routine influenza vaccine is also recommended. This vaccine can help prevent some cases of pneumonia.You may be offered the influenza vaccine as part of your care. If you smoke, it is time to quit. You may receive instructions on how to stop smoking. Your caregiver can provide medicines and counseling to help you quit. HOME CARE INSTRUCTIONS   Cough suppressants may be used if you are losing too much rest. However, coughing protects you by clearing your lungs. You should avoid using cough suppressants if you can.  Your caregiver may have prescribed medicine if he or she thinks your pneumonia is caused by a bacteria or influenza. Finish your medicine even if you start to feel  better.  Your caregiver may also prescribe an expectorant. This loosens the mucus to be coughed up.  Only take over-the-counter or prescription medicines for pain, discomfort, or fever as directed by your caregiver.  Do not smoke. Smoking is a common cause of bronchitis and can contribute to pneumonia. If you are a smoker and continue to smoke, your cough may last several weeks after your pneumonia has cleared.  A cold steam vaporizer or humidifier in your room or home may help loosen mucus.  Coughing is often worse at night. Sleeping in a semi-upright position in a recliner or using a couple pillows under your head will help with this.  Get rest as you feel it is needed. Your body will usually let you know when you need to rest. SEEK IMMEDIATE MEDICAL CARE IF:   Your illness becomes worse. This is especially true if you are elderly or weakened from any other disease.  You cannot control your cough with suppressants and are losing sleep.  You begin coughing up blood.  You develop pain which is getting worse or is uncontrolled with medicines.  You have a fever.  Any of the symptoms which initially brought you in for treatment are getting worse rather than  better.  You develop shortness of breath or chest pain. MAKE SURE YOU:   Understand these instructions.  Will watch your condition.  Will get help right away if you are not doing well or get worse. Document Released: 10/12/2005 Document Revised: 01/04/2012 Document Reviewed: 01/01/2011 [facility] Patient Information 2015 [place], [place]. This information is not intended to replace advice given to you by your health care provider. Make sure you discuss any questions you have with your health care provider. Cellulitis Cellulitis is an infection of the skin and the tissue beneath it. The infected area is usually red and tender. Cellulitis occurs most often in the arms and lower legs.  CAUSES  Cellulitis is caused by bacteria that  enter the skin through cracks or cuts in the skin. The most common types of bacteria that cause cellulitis are Staphylococcus and Streptococcus. SYMPTOMS   Redness and warmth.  Swelling.  Tenderness or pain.  Fever. DIAGNOSIS  Your caregiver can usually determine what is wrong based on a physical exam. Blood tests may also be done. TREATMENT  Treatment usually involves taking an antibiotic medicine. HOME CARE INSTRUCTIONS   Take your antibiotics as directed. Finish them even if you start to feel better.  Keep the infected arm or leg elevated to reduce swelling.  Apply a warm cloth to the affected area up to 4 times per day to relieve pain.  Only take over-the-counter or prescription medicines for pain, discomfort, or fever as directed by your caregiver.  Keep all follow-up appointments as directed by your caregiver. SEEK MEDICAL CARE IF:   You notice red streaks coming from the infected area.  Your red area gets larger or turns dark in color.  Your bone or joint underneath the infected area becomes painful after the skin has healed.  Your infection returns in the same area or another area.  You notice a swollen bump in the infected area.  You develop new symptoms. SEEK IMMEDIATE MEDICAL CARE IF:   You have a fever.  You feel very sleepy.  You develop vomiting or diarrhea.  You have a general ill feeling (malaise) with muscle aches and pains. MAKE SURE YOU:   Understand these instructions.  Will watch your condition.  Will get help right away if you are not doing well or get worse. Document Released: 07/22/2005 Document Revised: 04/12/2012 Document Reviewed: 12/28/2011 [facility] Patient Information 2015 [place], [place]. This information is not intended to replace advice given to you by your health care provider. Make sure you discuss any questions you have with your health care provider. Urinary Tract Infection Urinary tract infections (UTIs) can develop  anywhere along your urinary tract. Your urinary tract is your body's drainage system for removing wastes and extra water. Your urinary tract includes two kidneys, two ureters, a bladder, and a urethra. Your kidneys are a pair of bean-shaped organs. Each kidney is about the size of your fist. They are located below your ribs, one on each side of your spine. CAUSES Infections are caused by microbes, which are microscopic organisms, including fungi, viruses, and bacteria. These organisms are so small that they can only be seen through a microscope. Bacteria are the microbes that most commonly cause UTIs. SYMPTOMS  Symptoms of UTIs may vary by age and gender of the patient and by the location of the infection. Symptoms in young women typically include a frequent and intense urge to urinate and a painful, burning feeling in the bladder or urethra during urination. Older women and men  are more likely to be tired, shaky, and weak and have muscle aches and abdominal pain. A fever may mean the infection is in your kidneys. Other symptoms of a kidney infection include pain in your back or sides below the ribs, nausea, and vomiting. DIAGNOSIS To diagnose a UTI, your caregiver will ask you about your symptoms. Your caregiver also will ask to provide a urine sample. The urine sample will be tested for bacteria and white blood cells. White blood cells are made by your body to help fight infection. TREATMENT  Typically, UTIs can be treated with medication. Because most UTIs are caused by a bacterial infection, they usually can be treated with the use of antibiotics. The choice of antibiotic and length of treatment depend on your symptoms and the type of bacteria causing your infection. HOME CARE INSTRUCTIONS  If you were prescribed antibiotics, take them exactly as your caregiver instructs you. Finish the medication even if you feel better after you have only taken some of the medication.  Drink enough water and  fluids to keep your urine clear or pale yellow.  Avoid caffeine, tea, and carbonated beverages. They tend to irritate your bladder.  Empty your bladder often. Avoid holding urine for long periods of time.  Empty your bladder before and after sexual intercourse.  After a bowel movement, women should cleanse from front to back. Use each tissue only once. SEEK MEDICAL CARE IF:   You have back pain.  You develop a fever.  Your symptoms do not begin to resolve within 3 days. SEEK IMMEDIATE MEDICAL CARE IF:   You have severe back pain or lower abdominal pain.  You develop chills.  You have nausea or vomiting.  You have continued burning or discomfort with urination. MAKE SURE YOU:   Understand these instructions.  Will watch your condition.  Will get help right away if you are not doing well or get worse. Document Released: 07/22/2005 Document Revised: 04/12/2012 Document Reviewed: 11/20/2011 [facility] Patient Information 2015 [place], [place]. This information is not intended to replace advice given to you by your health care provider. Make sure you discuss any questions you have with your health care provider. Heart Failure Heart failure is a condition in which the heart has trouble pumping blood. This means your heart does not pump blood efficiently for your body to work well. In some cases of heart failure, fluid may back up into your lungs or you may have swelling (edema) in your lower legs. Heart failure is usually a long-term (chronic) condition. It is important for you to take good care of yourself and follow your caregiver's treatment plan. CAUSES  Some health conditions can cause heart failure. Those health conditions include:  High blood pressure (hypertension) causes the heart muscle to work harder than normal. When pressure in the blood vessels is high, the heart needs to pump (contract) with more force in order to circulate blood throughout the body. High blood pressure  eventually causes the heart to become stiff and weak.  Coronary artery disease (CAD) is the buildup of cholesterol and fat (plaque) in the arteries of the heart. The blockage in the arteries deprives the heart muscle of oxygen and blood. This can cause chest pain and may lead to a heart attack. High blood pressure can also contribute to CAD.  Heart attack (myocardial infarction) occurs when 1 or more arteries in the heart become blocked. The loss of oxygen damages the muscle tissue of the heart. When this happens, part  of the heart muscle dies. The injured tissue does not contract as well and weakens the heart's ability to pump blood.  Abnormal heart valves can cause heart failure when the heart valves do not open and close properly. This makes the heart muscle pump harder to keep the blood flowing.  Heart muscle disease (cardiomyopathy or myocarditis) is damage to the heart muscle from a variety of causes. These can include drug or alcohol abuse, infections, or unknown reasons. These can increase the risk of heart failure.  Lung disease makes the heart work harder because the lungs do not work properly. This can cause a strain on the heart, leading it to fail.  Diabetes increases the risk of heart failure. High blood sugar contributes to high fat (lipid) levels in the blood. Diabetes can also cause slow damage to tiny blood vessels that carry important nutrients to the heart muscle. When the heart does not get enough oxygen and food, it can cause the heart to become weak and stiff. This leads to a heart that does not contract efficiently.  Other conditions can contribute to heart failure. These include abnormal heart rhythms, thyroid problems, and low blood counts (anemia). Certain unhealthy behaviors can increase the risk of heart failure. Those unhealthy behaviors include:  Being overweight.  Smoking or chewing tobacco.  Eating foods high in fat and cholesterol.  Abusing illicit drugs or  alcohol.  Lacking physical activity. SYMPTOMS  Heart failure symptoms may vary and can be hard to detect. Symptoms may include:  Shortness of breath with activity, such as climbing stairs.  Persistent cough.  Swelling of the feet, ankles, legs, or abdomen.  Unexplained weight gain.  Difficulty breathing when lying flat (orthopnea).  Waking from sleep because of the need to sit up and get more air.  Rapid heartbeat.  Fatigue and loss of energy.  Feeling lightheaded, dizzy, or close to fainting.  Loss of appetite.  Nausea.  Increased urination during the night (nocturia). DIAGNOSIS  A diagnosis of heart failure is based on your history, symptoms, physical examination, and diagnostic tests. Diagnostic tests for heart failure may include:  Echocardiography.  Electrocardiography.  Chest X-ray.  Blood tests.  Exercise stress test.  Cardiac angiography.  Radionuclide scans. TREATMENT  Treatment is aimed at managing the symptoms of heart failure. Medicines, behavioral changes, or surgical intervention may be necessary to treat heart failure.  Medicines to help treat heart failure may include:  Angiotensin-converting enzyme (ACE) inhibitors. This type of medicine blocks the effects of a blood protein called angiotensin-converting enzyme. ACE inhibitors relax (dilate) the blood vessels and help lower blood pressure.  Angiotensin receptor blockers. This type of medicine blocks the actions of a blood protein called angiotensin. Angiotensin receptor blockers dilate the blood vessels and help lower blood pressure.  Water pills (diuretics). Diuretics cause the kidneys to remove salt and water from the blood. The extra fluid is removed through urination. This loss of extra fluid lowers the volume of blood the heart pumps.  Beta blockers. These prevent the heart from beating too fast and improve heart muscle strength.  Digitalis. This increases the force of the  heartbeat.  Healthy behavior changes include:  Obtaining and maintaining a healthy weight.  Stopping smoking or chewing tobacco.  Eating heart healthy foods.  Limiting or avoiding alcohol.  Stopping illicit drug use.  Physical activity as directed by your caregiver.  Surgical treatment for heart failure may include:  A procedure to open blocked arteries, repair damaged heart valves, or  remove damaged heart muscle tissue.  A pacemaker to improve heart muscle function and control certain abnormal heart rhythms.  An internal cardioverter defibrillator to treat certain serious abnormal heart rhythms.  A left ventricular assist device to assist the pumping ability of the heart. HOME CARE INSTRUCTIONS   Take your medicine as directed by your caregiver. Medicines are important in reducing the workload of your heart, slowing the progression of heart failure, and improving your symptoms.  Do not stop taking your medicine unless directed by your caregiver.  Do not skip any dose of medicine.  Refill your prescriptions before you run out of medicine. Your medicines are needed every day.  Take over-the-counter medicine only as directed by your caregiver or pharmacist.  Engage in moderate physical activity if directed by your caregiver. Moderate physical activity can benefit some people. The elderly and people with severe heart failure should consult with a caregiver for physical activity recommendations.  Eat heart healthy foods. Food choices should be free of trans fat and low in saturated fat, cholesterol, and salt (sodium). Healthy choices include fresh or frozen fruits and vegetables, fish, lean meats, legumes, fat-free or low-fat dairy products, and whole grain or high fiber foods. Talk to a dietitian to learn more about heart healthy foods.  Limit sodium if directed by your caregiver. Sodium restriction may reduce symptoms of heart failure in some people. Talk to a dietitian to  learn more about heart healthy seasonings.  Use healthy cooking methods. Healthy cooking methods include roasting, grilling, broiling, baking, poaching, steaming, or stir-frying. Talk to a dietitian to learn more about healthy cooking methods.  Limit fluids if directed by your caregiver. Fluid restriction may reduce symptoms of heart failure in some people.  Weigh yourself every day. Daily weights are important in the early recognition of excess fluid. You should weigh yourself every morning after you urinate and before you eat breakfast. Wear the same amount of clothing each time you weigh yourself. Record your daily weight. Provide your caregiver with your weight record.  Monitor and record your blood pressure if directed by your caregiver.  Check your pulse if directed by your caregiver.  Lose weight if directed by your caregiver. Weight loss may reduce symptoms of heart failure in some people.  Stop smoking or chewing tobacco. Nicotine makes your heart work harder by causing your blood vessels to constrict. Do not use nicotine gum or patches before talking to your caregiver.  Schedule and attend follow-up visits as directed by your caregiver. It is important to keep all your appointments.  Limit alcohol intake to no more than 1 drink per day for nonpregnant women and 2 drinks per day for men. Drinking more than that is harmful to your heart. Tell your caregiver if you drink alcohol several times a week. Talk with your caregiver about whether alcohol is safe for you. If your heart has already been damaged by alcohol or you have severe heart failure, drinking alcohol should be stopped completely.  Stop illicit drug use.  Stay up-to-date with immunizations. It is especially important to prevent respiratory infections through current pneumococcal and influenza immunizations.  Manage other health conditions such as hypertension, diabetes, thyroid disease, or abnormal heart rhythms as directed  by your caregiver.  Learn to manage stress.  Plan rest periods when fatigued.  Learn strategies to manage high temperatures. If the weather is extremely hot:  Avoid vigorous physical activity.  Use air conditioning or fans or seek a cooler location.  Avoid  caffeine and alcohol.  Wear loose-fitting, lightweight, and light-colored clothing.  Learn strategies to manage cold temperatures. If the weather is extremely cold:  Avoid vigorous physical activity.  Layer clothes.  Wear mittens or gloves, a hat, and a scarf when going outside.  Avoid alcohol.  Obtain ongoing education and support as needed.  Participate or seek rehabilitation as needed to maintain or improve independence and quality of life. SEEK MEDICAL CARE IF:   Your weight increases by 03 lb/1.4 kg in 1 day or 05 lb/2.3 kg in a week.  You have increasing shortness of breath that is unusual for you.  You are unable to participate in your usual physical activities.  You tire easily.  You cough more than normal, especially with physical activity.  You have any or more swelling in areas such as your hands, feet, ankles, or abdomen.  You are unable to sleep because it is hard to breathe.  You feel like your heart is beating fast (palpitations).  You become dizzy or lightheaded upon standing up. SEEK IMMEDIATE MEDICAL CARE IF:   You have difficulty breathing.  There is a change in mental status such as decreased alertness or difficulty with concentration.  You have a pain or discomfort in your chest.  You have an episode of fainting (syncope). MAKE SURE YOU:   Understand these instructions.  Will watch your condition.  Will get help right away if you are not doing well or get worse. Document Released: 10/12/2005 Document Revised: 02/06/2013 Document Reviewed: 11/03/2012 [facility] Patient Information 2015 [place], [place]. This information is not intended to replace advice given to you by your health  care provider. Make sure you discuss any questions you have with your health care provider.

## 2014-04-30 NOTE — Care Management Note (Signed)
    Page 1 of 1   04/30/2014     3:05:55 PM CARE MANAGEMENT NOTE 04/30/2014  Patient:  Madelon LipsJONES,Stayce M   Account Number:  [id number]  Date Initiated:  04/30/2014  Documentation initiated by:  Anibal HendersonBOLDEN,Liana Camerer  Subjective/Objective Assessment:   Admitted with [facility] with [facility]. Pt is from home alone. She has family who assist as neede, and [facility] is active in the home at present.     Action/Plan:   Pt and family have decided for her to go to rehab, and PT has recommended this. CSW has found a bed at [facility]   Anticipated DC Date:  04/30/2014   Anticipated DC Plan:  SKILLED NURSING FACILITY  In-house referral  Clinical Social Worker      DC Planning Services  CM consult      Choice offered to / List presented to:             Status of service:  Completed, signed off Medicare Important Message given?  YES (If response is "NO", the following Medicare IM given date fields will be blank) Date Medicare IM given:  04/30/2014 Medicare IM given by:  Anibal HendersonBOLDEN,Mintie Witherington Date Additional Medicare IM given:   Additional Medicare IM given by:    Discharge Disposition:  SKILLED NURSING FACILITY  Per UR Regulation:  Reviewed for med. necessity/level of care/duration of stay  If discussed at Long Length of Stay Meetings, dates discussed:    Comments:  04/27/14 1430 Anibal HendersonGeneva Aadan Chenier RN

## 2014-04-30 NOTE — Discharge Summary (Signed)
Physician Discharge Summary  KISSA CAMPOY ZOX:[id number] DOB: May 05, 1920 DOA: 04/26/2014  PCP: Milinda Antis, MD  Admit date: 04/26/2014 Discharge date: 04/30/2014  Time spent: 45 minutes  Recommendations for Outpatient Follow-up:  Patient will be discharged to and sent to a nursing facility. She'll need to continue physical therapy as well as occupational therapy as recommended by the nursing facility. Patient should continue her medications as prescribed. She would follow up with her primary care physician within one week of discharge. Patient also follow up with cardiology within 2-3 weeks of discharge. Patient to continue a heart healthy diet with 1500 mL per day fluid restriction.  Patient should have her hemoglobin monitored within one week of discharge.  Discharge Diagnoses:  Fever secondary to UTI versus HCAP Diastolic CHF Atrial fibrillation with ventricular response Generalized weakness Urinary tract infection Right lower extremity cellulitis Normocytic anemia Chronic kidney disease, stage III Hypothyroidism Peripheral neuropathy Constipation Depression Peripheral arterial disease Hypertension  Discharge Condition: Stable  Diet recommendation: Heart healthy with 1500 mL per day fluid restriction  Filed Weights   04/26/14 1049 04/26/14 1402 04/27/14 0600  Weight: 47.628 kg (105 lb) 48 kg (105 lb 13.1 oz) 49 kg (108 lb 0.4 oz)    History of present illness:  [name] is a 80 y.o. 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 Patient states that her weakness has been ongoing for the last several days. She was accompanied by her granddaughter who also stated that she has not been herself lately. Patient admitted to hitting her leg a few days ago, and coming to the emergency department on 04/16/2014 after falling out of bed. Patient was seen and sent  home. She was to follow up with Dr. Tanda Rockers for wound care. Again over the last several days, patient has had weakness, she was seen by her home health nurse today and was found to have a rapid heart rate. Patient was then sent to the emergency department. During admission, patient denied any chest pain shortness of breath, abdominal pain, nausea, vomiting, diarrhea, recent travel, or sick contacts.  Hospital Course:  Fever secondary to UTI vs HCAP  -Patient had fever of 101.9 overnight  -CXR: ?Superimposed pneumoinia  -Patient initially placed on doxycycline, was switched to IV vancomycin and cefepime. -Patient will be discharged with Levaquin. -urine culture shows greater than 100,000 enterococcus -Blood cultures show no growth today.  Diastolic CHF  -Echo showed grade 2 diastolic dysfunction  -Continue lasix, fluid restriction, monitoring of daily weights -Appears compensated  Atrial fibrillation with rapid ventricular response  -Patient was initially placed on Cardizem drip in the emergency department and continued with little improvement in her HR  -Cardiology was consulted and appreciated and placed patient on amiodarone drip which was discontinued on 7/3, and patient was transitioned to PO  -Will continue Amiodarone 200mg  PO BID for 3 weeks, and then daily thereafter.  -Magnesium and phosphate levels WNL  -TSH 0.181  -Echocardiogram: EF 55-60%, grade 2 diastolic dysfunction  -Continue aspirin  -Patient is not a candidate for anticoagulation due to history of falls  -Patient converted to sinus rhythm  -Patient will need a followup cardiology within 2-3 weeks.  Generalized weakness  -TSH 0.181, Vit D 25 hydroxy 67, Vit B12 867, folate >20  -Possibly related to patient's atrial fibrillation with rapid ventricular response vs UTI  -PT consulted, and recommended skilled nursing facility  Urinary tract  infection  -UA: WBC TNTC, Large leukocytes, many bacteria  -Urine culture shows  enterococcus -No abdominal pain  -Patient will be discharged on Levaquin  Right lower extremity cellulitis  -No leukocytosis  -wound care consulted  -Patient was discharged on Levaquin  Normocytic Anemia  -Baseline hemoglobin appears to be 12  -Continues to drop, Hb 8.7 -Anemia panel: Iron <10, Ferritin 147  -Continue iron supplementation -Pending FOBT -Will need H/H within one week   Chronic kidney disease, stage III  -Creatinine appears to be at baseline, continue to monitor BMP   Hypothyroidism  -TSH 0.181, Free T4 1.14  -Continue Synthroid at current dose   Peripheral neuropathy  -Continue gabapentin   Constipation  -Continue miralax   Depression  -Continue Effexor   Peripheral arterial disease  -Continue aspirin   Hypertension  -Stable continue amlodipine, Lasix, clonidine   Procedures: Echocardiogram  Impressions: Mild LVH with LVEF 55-60%, grade 2 diastolic dysfunction. Mild left atrial enlargement. Mild mitral regurgitation. Mildly sclerotic aortic valve with trivial aortic regurgitation. Mild tricuspid regurgitation with PASP 49 mmHg.  Consultations: Cardiology  Discharge Exam: Filed Vitals:   04/30/14 1027  BP: 106/55  Pulse:   Temp:   Resp:    Exam  General: Well developed, frail, NAD, appears stated age  HEENT: NCAT, mucous membranes moist.  Neck: Supple, no JVD, no masses  Cardiovascular: S1 S2 auscultated, RRR  Respiratory: Clear to auscultation bilaterally with equal chest rise  Abdomen: Soft, nontender, nondistended, + bowel sounds  Extremities: warm dry without cyanosis clubbing or edema  Neuro: AAOx3, No focal deficits  Skin: RLE- wrapped. LLE bruising with color change  Psych: Normal affect and demeanor with intact judgement and insight  Discharge Instructions      Discharge Instructions   Discharge instructions    Complete by:  As directed   Patient will be discharged to and sent to a nursing facility. She'll need to continue  physical therapy as well as occupational therapy as recommended by the nursing facility. Patient should continue her medications as prescribed. She would follow up with her primary care physician within one week of discharge. Patient also follow up with cardiology within 2-3 weeks of discharge. Patient to continue a heart healthy diet with 1500 mL per day fluid restriction.  Patient should have her hemoglobin monitored within one week of discharge.            Medication List         amiodarone 200 MG tablet  Commonly known as:  PACERONE  Take 1 tablet (200 mg total) by mouth 2 (two) times daily.     amiodarone 200 MG tablet  Commonly known as:  PACERONE  Take 1 tablet (200 mg total) by mouth daily.  Start taking on:  05/19/2014     amLODipine 5 MG tablet  Commonly known as:  NORVASC  Take 1 tablet (5 mg total) by mouth daily.     antiseptic oral rinse Liqd  15 mLs by Mouth Rinse route 2 (two) times daily.     aspirin EC 81 MG tablet  Take 81 mg by mouth every morning.     CALTRATE 600 PLUS-VIT D PO  Take 1 tablet by mouth 2 (two) times daily.     cloNIDine 0.1 MG tablet  Commonly known as:  CATAPRES  Take 1 tablet (0.1 mg total) by mouth at bedtime.     collagenase ointment  Commonly known as:  SANTYL  Apply topically daily.  ferrous sulfate 325 (65 FE) MG tablet  Take 1 tablet (325 mg total) by mouth 2 (two) times daily with a meal.     furosemide 20 MG tablet  Commonly known as:  LASIX  TAKE ONE TABLET DAILY.     gabapentin 300 MG capsule  Commonly known as:  NEURONTIN  TAKE (1) CAPSULE BY MOUTH IN THE MORNING AND (2) CAPSULES AT BEDTIME.     levofloxacin 500 MG tablet  Commonly known as:  LEVAQUIN  Take 1 tablet (500 mg total) by mouth every other day.     levothyroxine 50 MCG tablet  Commonly known as:  SYNTHROID, LEVOTHROID  TAKE ONE TABLET DAILY.     MYRBETRIQ 25 MG Tb24 tablet  Generic drug:  mirabegron ER  Take 25 mg by mouth daily.      polyethylene glycol packet  Commonly known as:  MIRALAX / GLYCOLAX  Take 17 g by mouth daily.     SENIOR MULTIVITAMIN PLUS Tabs  Take 1 tablet by mouth every morning.     SYSTANE OP  Apply 1-2 drops to eye daily.     traMADol 50 MG tablet  Commonly known as:  ULTRAM  TAKE (1) TABLET BY MOUTH TWICE A DAY AS NEEDED.     triamcinolone cream 0.1 %  Commonly known as:  KENALOG  Apply 1 application topically every other day.     venlafaxine 37.5 MG tablet  Commonly known as:  EFFEXOR  TAKE ONE TABLET DAILY.       Allergies  Allergen Reactions  . Acetaminophen-Codeine     REACTION: causes nausea  . Sulfa Antibiotics Other (See Comments)    Makes her 'sick'   Follow-up Information   Follow up with Milinda AntisURHAM, KAWANTA, MD. Schedule an appointment as soon as possible for a visit in 1 week. [facility](Hospital followup)    Specialty:  Family Medicine   Contact information:   [address] [place] [address] [phone]       Follow up with Dina RichBRANCH, JONATHAN, F, MD In 2 weeks. [facility](Hospital followup)    Specialty:  Cardiology   Contact information:   [address] [place] [address] [phone]        The results of significant diagnostics from this hospitalization (including imaging, microbiology, ancillary and laboratory) are listed below for reference.    Significant Diagnostic Studies: Dg Chest 2 View  04/29/2014   CLINICAL DATA:  Fever and weakness  EXAM: CHEST  2 VIEW  COMPARISON:  Portable chest x-ray of April 26, 2014  FINDINGS: The lungs are mildly hyperinflated. Increased conspicuity of perihilar and infrahilar densities within the lung parenchyma is present today. The left hemidiaphragm is now obscured. The cardiac silhouette is top-normal in size but stable. The pulmonary vascularity is not clearly engorged. There are moderate-sized bilateral pleural effusions layering posteriorly.  IMPRESSION: 1. Increased interstitial densities bilaterally are worrisome for pulmonary  edema likely of cardiac cause. Superimposed pneumonia is not excluded. 2. Moderate-sized bilateral pleural effusions layering posteriorly. 3. COPD   Electronically Signed   By: David  SwazilandJordan   On: 04/29/2014 08:15   Dg Femur Right  04/16/2014   CLINICAL DATA:  Right hip pain and distal tibial skin tear after fall out of bed.  EXAM: RIGHT FEMUR - 2 VIEW  COMPARISON:  Right hip 04/26/2008  FINDINGS: Postoperative changes with short intra medullary rod and compression screw fixation of the right proximal femur. Old healed fracture deformity of the inner trochanteric  region femoral neck. Periosteal reaction around the distal locking screw. No evidence of acute fracture or dislocation. Distal right femur is unremarkable. Vascular calcifications.  IMPRESSION: Old postoperative/posttraumatic changes in the right hip. No acute fractures demonstrated.   Electronically Signed   By: Burman NievesWilliam  Stevens M.D.   On: 04/16/2014 04:29   Dg Tibia/fibula Right  04/16/2014   CLINICAL DATA:  Right hip pain in skin tear after fall out of bed.  EXAM: RIGHT TIBIA AND FIBULA - 2 VIEW  COMPARISON:  None.  FINDINGS: There is no evidence of fracture or other focal bone lesions. Soft tissues are unremarkable. Vascular calcifications.  IMPRESSION: Negative.   Electronically Signed   By: Burman NievesWilliam  Stevens M.D.   On: 04/16/2014 04:30   Dg Chest Portable 1 View  04/26/2014   CLINICAL DATA:  Weakness.  Atrial fibrillation  EXAM: PORTABLE CHEST - 1 VIEW  COMPARISON:  12/30/2010  FINDINGS: Mild cardiac enlargement. Vascularity is normal. Negative for edema  Small bilateral pleural effusions and mild left lower lobe atelectasis. Negative for pneumonia.  IMPRESSION: Small bilateral effusions and mild bibasilar atelectasis. Negative for edema.   Electronically Signed   By: Marlan Palauharles  Clark M.D.   On: 04/26/2014 11:49    Microbiology: Recent Results (from the past 240 hour(s))  MRSA PCR SCREENING     Status: None   Collection Time    04/26/14   1:53 PM      Result Value Ref Range Status   MRSA by PCR NEGATIVE  NEGATIVE Final   Comment:            The GeneXpert MRSA Assay (FDA     approved for NASAL specimens     only), is one component of a     comprehensive MRSA colonization     surveillance program. It is not     intended to diagnose MRSA     infection nor to guide or     monitor treatment for     MRSA infections.  URINE CULTURE     Status: None   Collection Time    04/26/14  5:47 PM      Result Value Ref Range Status   Specimen Description URINE, RANDOM   Final   Special Requests NONE   Final   Culture  Setup Time     Final   Value: 04/27/2014 23:21     Performed at [facility]   Colony Count     Final   Value: >=100,000 COLONIES/ML     Performed at [facility]   Culture     Final   Value: ENTEROCOCCUS SPECIES     Performed at [facility]   Report Status 04/29/2014 FINAL   Final   Organism ID, Bacteria ENTEROCOCCUS SPECIES   Final  CULTURE, BLOOD (ROUTINE X 2)     Status: None   Collection Time    04/28/14  9:07 PM      Result Value Ref Range Status   Specimen Description LEFT ANTECUBITAL   Final   Special Requests BOTTLES DRAWN AEROBIC ONLY 6CC   Final   Culture NO GROWTH 1 DAY   Final   Report Status PENDING   Incomplete  CULTURE, BLOOD (ROUTINE X 2)     Status: None   Collection Time    04/28/14  9:07 PM      Result Value Ref Range Status   Specimen Description RIGHT ANTECUBITAL   Final   Special Requests BOTTLES DRAWN  AEROBIC AND ANAEROBIC 14CC   Final   Culture NO GROWTH 1 DAY   Final   Report Status PENDING   Incomplete     Labs: Basic Metabolic Panel:  Recent Labs Lab 04/26/14 1112 04/26/14 1524 04/27/14 0703 04/28/14 0455 04/29/14 0810 04/30/14 0614  NA 137  --  134* 135* 139 139  K 3.9  --  3.4* 4.2 4.3 4.6  CL 99  --  95* 98 99 100  CO2 28  --  28 26 26 28   GLUCOSE 128*  --  103* 126* 94 95  BUN 24*  --  27* 30* 27* 30*  CREATININE 1.21*  --  1.37*  1.41* 1.27* 1.38*  CALCIUM 8.8  --  8.8 8.7 9.1 8.8  MG  --  1.9  --   --   --   --   PHOS  --  3.2  --   --   --   --    Liver Function Tests:  Recent Labs Lab 04/27/14 0703  AST 34  ALT 15  ALKPHOS 101  BILITOT 0.2*  PROT 6.2  ALBUMIN 2.7*   No results found for this basename: LIPASE, AMYLASE,  in the last 168 hours No results found for this basename: AMMONIA,  in the last 168 hours CBC:  Recent Labs Lab 04/26/14 1112 04/27/14 0703 04/28/14 0455 04/29/14 0810 04/30/14 0614  WBC 10.0 7.5 7.7 5.8 6.3  NEUTROABS 8.1*  --   --   --   --   HGB 10.3* 9.7* 9.9* 9.4* 8.7*  HCT 30.6* 28.8* 29.4* 28.1* 26.4*  MCV 91.9 90.3 89.9 90.4 91.3  PLT 280 257 293 270 302   Cardiac Enzymes:  Recent Labs Lab 04/26/14 1112  TROPONINI <0.30   BNP: BNP (last 3 results) No results found for this basename: PROBNP,  in the last 8760 hours CBG: No results found for this basename: GLUCAP,  in the last 168 hours     Signed:  Edsel Petrin  [facility] 04/30/2014, 11:54 AM

## 2014-04-30 NOTE — Progress Notes (Signed)
[facility] removed and intact. No pain/swelling at site. [facility] Nurse stated to not change dressing due to having to do an assessment on her when she arrival. No c/o pain at this time and family at bedside.

## 2014-04-30 NOTE — Progress Notes (Signed)
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 Patient states that her weakness has been ongoing for the last several days. She is accompanied by her granddaughter who also stated that she has not been herself lately. Patient admits to hitting her leg a few days ago, and coming to the emergency department on 04/16/2014 after falling out of bed. Patient was seen and sent home. She was to follow up with Dr. Tanda RockersNichols for wound care. Again over the last several days, patient has had weakness, she was seen by her home health nurse today and was found to have a rapid heart rate. Patient was then sent to the emergency department. At this time patient denies any chest pain shortness of breath, abdominal pain, nausea, vomiting, diarrhea, recent travel, or sick contacts.    PT Comments    Pt cooperative and eager to try to ambulate today. Son present for treatment.  Pt able to transfer from supine to EOB independently but with increased time.  Pt with pillow under knees, encouraged patient not to use due to decreased knee extension noted bilaterally.  Also encouraged patient to perform ankle pumps while supine to increase LE circulation. Pt able to ambulate 6 feet today from EOB to recliner using RW Constant VC's for posture.  Pt unable to fully extend LE's due to gastroc and hamstring tightness.  Pt up in recliner with call bell, chair alarm and son present.    Follow Up Recommendations  SNF     Equipment Recommendations  None recommended by PT    Recommendations for Other Services       Precautions / Restrictions   Fall   Mobility  Bed Mobility Overal bed mobility: Modified Independent Bed Mobility: Supine to Sit     Supine to sit: Modified independent (Device/Increase time);HOB elevated     General bed mobility comments: Pt able to transfer EOB with increased time without therapist assistance.  Transfers Overall transfer level: Needs assistance Equipment used: Rolling walker (2 wheeled) Transfers: Sit to/from Stand Sit to Stand: Mod assist         General transfer comment: Min assist for walker navigation and obtaining erect posture.  pt with difficulty getting heels down due to decreased ankle and knee mobility.   Ambulation/Gait Ambulation/Gait assistance: Min assist;Mod assist Ambulation Distance (Feet): 6 Feet Assistive device: Rolling walker (2 wheeled) Gait Pattern/deviations: Step-to pattern;Decreased step length - right;Decreased step length - left;Trunk flexed;Narrow base of support     General Gait Details: Pt with small steps and forward bent posture.     Stairs            Wheelchair Mobility    Modified Rankin (Stroke Patients Only)       Balance                                    Cognition Arousal/Alertness: Awake/alert Behavior During Therapy: WFL for tasks assessed/performed Overall Cognitive Status: Within Functional Limits for tasks assessed                      Exercises General Exercises - Lower Extremity Ankle Circles/Pumps: AROM;Both;10 reps;Supine Long Arc  Quad: AROM;Both;5 reps;Seated    General Comments             Home Living                      Prior Function            PT Goals (current goals can now be found in the care plan section)      Frequency  Min 3X/week    PT Plan Current plan remains appropriate    Co-evaluation             End of Session Equipment Utilized During Treatment: Gait belt;Oxygen Activity Tolerance: Patient limited by fatigue Patient left: in chair;with call bell/phone within reach;with chair alarm set;with family/visitor present     Time: [id number] PT  Time Calculation (min): 41 min  Charges:  $Gait Training: 8-22 mins $Therapeutic Activity: 8-22 mins                    G Codes:      Lurena NidaAmy B Frazier, PTA/CLT 04/30/2014, 10:11 AM

## 2014-04-30 NOTE — Progress Notes (Signed)
UR chart review completed.  

## 2014-05-01 ENCOUNTER — Non-Acute Institutional Stay (SKILLED_NURSING_FACILITY): Payer: Medicare Other | Admitting: Internal Medicine

## 2014-05-01 DIAGNOSIS — L03119 Cellulitis of unspecified part of limb: Secondary | ICD-10-CM

## 2014-05-01 DIAGNOSIS — N390 Urinary tract infection, site not specified: Secondary | ICD-10-CM

## 2014-05-01 DIAGNOSIS — G609 Hereditary and idiopathic neuropathy, unspecified: Secondary | ICD-10-CM

## 2014-05-01 DIAGNOSIS — J189 Pneumonia, unspecified organism: Secondary | ICD-10-CM

## 2014-05-01 DIAGNOSIS — L02419 Cutaneous abscess of limb, unspecified: Secondary | ICD-10-CM

## 2014-05-01 DIAGNOSIS — I10 Essential (primary) hypertension: Secondary | ICD-10-CM

## 2014-05-01 DIAGNOSIS — I4891 Unspecified atrial fibrillation: Secondary | ICD-10-CM

## 2014-05-01 DIAGNOSIS — D649 Anemia, unspecified: Secondary | ICD-10-CM

## 2014-05-01 DIAGNOSIS — G629 Polyneuropathy, unspecified: Secondary | ICD-10-CM

## 2014-05-01 DIAGNOSIS — E039 Hypothyroidism, unspecified: Secondary | ICD-10-CM

## 2014-05-01 LAB — VITAMIN D 1,25 DIHYDROXY
Vitamin D 1, 25 (OH)2 Total: 28 pg/mL (ref 18–72)
Vitamin D2 1, 25 (OH)2: <8 pg/mL
Vitamin D3 1, 25 (OH)2: 28 pg/mL

## 2014-05-01 NOTE — Progress Notes (Signed)
Patient ID: [name], female   DOB: 05-Mar-1920, 80 y.o.   MRN: [id number]   This is an acute visit.  Level of care skilled.  Facility [facility].  Chief complaint-acute visit status post hospitalization for fever all secondary possibly to UTI and pneumonia lower extremity cellulitis-also atrial fibrillation with rapid ventricular response.  History of present illness.  Patient is a pleasant 80 year old female that presented to the ER with weakness and a fast heart rate.  She apparently had hit her leg a few days previous.  And actually had been seen in the ER and sent home with recommendation to follow up with wound care physician.  During subsequent admission she was diagnosed with a fever thought secondary to UTI versus pneumonia.  Chest x-ray showed questionable superimposed pneumonia she was started on doxycycline this was switched to IV vancomycin and cephapirin and she has been discharged on Levaquin however Levaquin does have an interaction with the amiodarone and we will change this.  She also had elevated heart rate she initially was placed on Cardizem drip this did not help much cardiology was consulted and she was placed on the amiodarone which was given review of drip with plans to continue this at 200 mg twice a day for 3 weeks and then daily thereafter.  Her TSH also was low at 0.181-free T4 was 1.14 recommendation was to continue Synthroid at current dose  Patient also was found to have right lower extremity cellulitis again she was placed on antibiotic.  She also has normocytic anemia with baseline hemoglobin around 12 however it dropped in the hospital around 8.7 lab done today shows stability at 8.8-anemia panel showed an iron less than 10 and a ferritin 147 she is on iron supplementation apparently fecal blood testing has been ordered.  .  She is here essentially for rehabilitation and strengthening as well as continued antibiotic therapy for her numerous  etiologies  .  Previous medical history.  Fever secondary to UTI versus pneumonia versus lower extremity cellulitis.  Atrial fibrillation with ventricular response.  Diastolic CHF.  UTI.  Normocytic anemia.  Chronic kidney disease stage III.  COPD currently on oxygen  Hypopthyroidism  Peripheral neuropathy.  Constipation.  Depression.  Peripheral arterial disease.  Hypertension.  Irritable bowel syndrome.  Hearing loss.   previous surgeries.  At surgery x2.  Neck surgery.  Hip surgery.  Eye surgery with cataract removal.  Abdominal hysterectomy.  Social history denies any tobacco history or alcohol or illicit drug use.  She had lived at home alone however she has home health support and a granddaughter who lives close by as used a walker for ambulation.  Family history history of hypertension in her mother as well as father.  History CVA in her father.  History of heart disease in a couple of brothers.  Medications.  Amiodarone 200 mg twice a day.  X3 weeks and then daily.  Norvasc 5 mg daily.  Antiseptic then rinse 15 mL twice a day.  Aspirin enteric-coated 81 mg daily.  Caltrate 600 plus vitamin D twice a day.  Clonidine 0.1 mg each bedtime.  Santyl ointment daily.  Ferrous sulfate 325 mg twice a day with meal.  Lasix 20 mg daily.  Neurontin 300 mg 1 tab in the morning and 2 tablets at bedtime.  Levaquin 500 mg every other day again this will be discontinued as noted above.  Synthroid 50 mcg daily.  Mybetriq 25 mg daily.  MiraLax daily.  Multivitamin daily.  Systane  eyedrops 1-2 drops daily to eyes.  Tramadol 50 mg twice a day when necessary.  Kenalog cream 0.1% one application topically every other day.  Effexor 37.5 mg daily.  Review of systems As a fever chills.  Skin-has history of right lower extremity cellulitis also numerous bruising upper lower extremities very fragile skin.  Head ears eyes nose mouth  and throat-has prescription lenses does not complaining of visual changes or sore throat or nasal discharge.  Respiratory is oxygen dependent but does not complaining of increased shortness of breath from baseline does have shortness of breath with exertion which he says is chronic.  Cardiac history of A. fib as noted above but does not complaining of chest pain or palpitations no significant lower extremity edema.  GI does not complaining of any abdominal pain nausea vomiting diarrhea or constipation.  Muscle skeletal has general frailty but is not complaining of pain currently.  Neurologic does have a history of neuropathy and restless leg she says the Neurontin does help with this does not complaining of headache dizziness or syncopal-type feelings.  Psych some history of depression appears to be in good spirits but is frustrated with her recent medical issues.  Physical exam.  Temperature 97.5 pulse 68 respirations 18 blood pressure 157/63 O2 saturation is 89% on 4 L via nasal cannula  In general this is a frail elderly female in no distress sitting comfortably in her wheelchair.  Her skin is warm and dry she does have numerous issues especially lower legs on the right there is several open areas with stasis changes and some erythema of very low right leg superior to the ankle this is followed by wound care.  She also has some chronic bruising of all her extremities again most noticeable on her legs bilaterally.  Eyes she has a somewhat disconjugate gaze visual acuity appears grossly intact pupils are reactive sclera and conjunctiva are clear she has prescription lenses.  Oropharynx is clear mucous membranes moist.  Chest has reduced breath sounds shallow air entry but clear to auscultation no labored breathing.  Heart is regular rate and rhythm without murmur gallop or rub she has scant lower extremity edema.  Pedal pulses are reduced bilaterally again she does have stasis  changes lower extremities feet bilaterally.  Abdomen is protuberant soft nontender with positive bowel sounds.  Muscle skeletal is quite frail all extremities able to move all extremities with significant lower extremity weakness--. Currently ambulating in wheelchair she will need extensive rehabilitation.  Neurologic appears grossly intact no lateralizing findings her speech is clear.  Psych she is alert and oriented x3 pleasant and appropriate.    Marland Kitchen  Physical exam.  .  Labs.  05/01/2014.  WBC 7.3 hemoglobin 8.8 platelets 307.  Sodium 139 potassium 4.6 BUN 31 creatinine 1.3 to.  04/30/2014.  Sodium 139 potassium 4.6 BUN 30 creatinine 1.38.  WCC 6.3 hemoglobin 8.7 platelets 302  Hemoglobin on July 2 was 10.3.  Liver function tests 04/27/2014.  Within normal limits except bilirubin of 0.2 albumin of 2.7.  Assessment and plan.  #1-history of a fever secondary to UTI versus pneumonia-her antibiotic has been simplified to Levaquin however this does have an interaction with the amiodarone-I have reviewed UTI sensitivities it is sensitive to ampicillin-Will switch to ampicillin 500 mg 4 times a day x7 days-this was discussed with Dr. Leanord Hawking via phone he will followup tomorrow    will need extensive therapy secondary to what appears to be significant debility with recent hospitalization .  Marland Kitchen  #  2-history of atrial fibrillation with rapid ventricular response at this point appears to be rate controlled she is on amiodarone twice a day per cardiology recommendation--continue to monitor.  #3 history of diastolic CHF she is on Lasix we'll monitor her weights closely this appears stable currently.  History of generalized weakness TSH was somewhat low vitamin D was 67 B12 867 folate was greater than 20 posturally related to her atrial fibrillation with rapid ventricular response-recommendation for physical therapy.  History of right lower extremity cellulitis again we will put  her on an antibiotic as she was discharged on Levaquin but this has an interaction with the amiodarone  #6-history of normocytic anemia hemoglobin appears to have stabilized she is on iron Will order occult blood testing as well as a followup CBC later this week.  #7-history of chronic kidney disease this appears relatively stable at baseline Will update BMP next week.  #8 has hypothyroidism TSH was 0.181 free T4-1 0.14 recommendation was continue Synthroid at current dose Will update TSH in 4 weeks.  #9-history of peripheral neuropathy she is on Neurontin continue to monitor.  #10 history hypertension she is on numerous agents including Norvasc and clonidine at this point will monitor since there are minimal readings.  #11-history COPD-she is on oxygen to keep O2 saturations 88% or above-she does not complain of shortness of breath beyond baseline  [id number]-of note greater than 45 minutes spent assessing patient-reviewing her hospital records-and coordinating and formulating a plan of care for numerous diagnoses-of note greater than 50% of time spent coordinating plan of care

## 2014-05-02 ENCOUNTER — Non-Acute Institutional Stay (SKILLED_NURSING_FACILITY): Payer: Medicare Other | Admitting: Internal Medicine

## 2014-05-02 DIAGNOSIS — R0902 Hypoxemia: Secondary | ICD-10-CM

## 2014-05-02 DIAGNOSIS — I5041 Acute combined systolic (congestive) and diastolic (congestive) heart failure: Secondary | ICD-10-CM

## 2014-05-02 DIAGNOSIS — N390 Urinary tract infection, site not specified: Secondary | ICD-10-CM

## 2014-05-02 DIAGNOSIS — I509 Heart failure, unspecified: Secondary | ICD-10-CM

## 2014-05-02 DIAGNOSIS — I4891 Unspecified atrial fibrillation: Secondary | ICD-10-CM
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
## 2014-05-03 LAB — CULTURE, BLOOD (ROUTINE X 2)
CULTURE: NO GROWTH
Culture: NO GROWTH

## 2014-05-07 ENCOUNTER — Non-Acute Institutional Stay (SKILLED_NURSING_FACILITY): Payer: Medicare Other | Admitting: Internal Medicine

## 2014-05-07 DIAGNOSIS — I5041 Acute combined systolic (congestive) and diastolic (congestive) heart failure: Secondary | ICD-10-CM

## 2014-05-07 DIAGNOSIS — I509 Heart failure, unspecified: Secondary | ICD-10-CM

## 2014-05-07 DIAGNOSIS — R0902 Hypoxemia: Secondary | ICD-10-CM

## 2014-05-07 DIAGNOSIS — I4891 Unspecified atrial fibrillation: Secondary | ICD-10-CM

## 2014-05-09 ENCOUNTER — Non-Acute Institutional Stay (SKILLED_NURSING_FACILITY): Payer: Medicare Other | Admitting: Internal Medicine

## 2014-05-09 DIAGNOSIS — I872 Venous insufficiency (chronic) (peripheral): Secondary | ICD-10-CM

## 2014-05-14 ENCOUNTER — Non-Acute Institutional Stay (SKILLED_NURSING_FACILITY): Payer: Medicare Other | Admitting: Internal Medicine

## 2014-05-14 DIAGNOSIS — I87339 Chronic venous hypertension (idiopathic) with ulcer and inflammation of unspecified lower extremity: Secondary | ICD-10-CM

## 2014-05-14 DIAGNOSIS — L97919 Non-pressure chronic ulcer of unspecified part of right lower leg with unspecified severity: Secondary | ICD-10-CM

## 2014-05-14 DIAGNOSIS — I87331 Chronic venous hypertension (idiopathic) with ulcer and inflammation of right lower extremity: Principal | ICD-10-CM

## 2014-05-14 DIAGNOSIS — L97909 Non-pressure chronic ulcer of unspecified part of unspecified lower leg with unspecified severity: Secondary | ICD-10-CM

## 2014-05-14 NOTE — Progress Notes (Addendum)
Patient ID: [name], female   DOB: 02/07/1920, 80 y.o.   MRN: [id number]               PROGRESS NOTE  DATE:  05/07/2014    FACILITY: [facility]    LEVEL OF CARE:   SNF   Acute Visit   CHIEF COMPLAINT:  Follow up atrial fibrillation, diastolically-mediated heart failure, and high oxygen requirements.    HISTORY OF PRESENT ILLNESS:   This is a patient who came to us after being hospitalized with a combination of CHF and superimposed pneumonia as well as a possible UTI.  She came to us on 5 L of oxygen with O2 sats in the 88-90% range.  Her atrial fibrillation was controlled.  I am following her today for these issues.    REVIEW OF SYSTEMS:   CHEST/RESPIRATORY:  She does not complain of shortness of breath, but occasional cough.     CARDIAC:   No complaints of chest pain.   GI:  No complaints of abdominal pain.   GU:  No dysuria.      PHYSICAL EXAMINATION:   VITAL SIGNS:   PULSE:  In the 50s to 60s range.   RESPIRATIONS:  20.   O2 SATURATIONS:  Now on 2 L, in the 93-95% range.   GENERAL APPEARANCE:  The patient is awake, alert, and cooperative.   CHEST/RESPIRATORY:  Significant thoracic kyphosis.  Shallow air entry, but her air entry sounds quite good.   CARDIOVASCULAR:  CARDIAC:   Heart sounds are normal.  Her JVP is no longer visible.   GASTROINTESTINAL:  ABDOMEN:   Soft, nontender.   CIRCULATION:  EDEMA/VARICOSITIES:  Extremities:  She has severe venous stasis.  I will need to see these wounds when I am next in the facility.    ASSESSMENT/PLAN:  Atrial fibrillation.  Her heart rate is now in the 50s to 60s.  She came to us on amiodarone 200 mg twice a day.  She does not appear to be on anything else that should be lowering her heart rate.  Otherwise, she appears to be tolerating this well.  This will need to be monitored.    Diastolically-mediated heart failure.  She is responding well to the Lasix.  Her lab work from today shows a BUN of 28, a creatinine  of 1.23, as opposed to 26 and 1.08 on admission.  Her hemoglobin is 9.5, as opposed to 8.8 on admission.    High-flow oxygen.  She is down to 2 L from 5 and satting in the mid 90s.   We have encouraged her to use her incentive spirometer to see if we can taper her from her oxygen that she was not on prior to admission.    Severe venous stasis with severe PAD.  I will need to look at these wounds on Wednesday.

## 2014-05-14 NOTE — Progress Notes (Signed)
Patient ID: [name], female   DOB: Mar 05, 1920, 80 y.o.   MRN: [id number]               PROGRESS NOTE  DATE:  05/09/2014    FACILITY: [facility]    LEVEL OF CARE:   SNF   Acute Visit   CHIEF COMPLAINT:  Review of wounds on her right leg.    HISTORY OF PRESENT ILLNESS:  [name] is a very frail woman who came to us after a stay at hospital for atrial fibrillation, also a fever with a chest x-ray suggesting CHF and superimposed pneumonia.  From this point of view, she really has done fairly well.  Her cardiopulmonary status appears to be reasonably stable.    On her right leg, she has some very significant stasis type wounds.  She has similar changes in the skin on the left, but no open wounds.  On top of this, she has severe peripheral vascular disease which is known from noninvasive studies last fall.  Apparently, she is not a candidate for revascularization.  We have been applying Santyl-based dressings to these wounds under a light wrap.    PHYSICAL EXAMINATION:   SKIN:  INSPECTION:  Right leg:  The wounds are fairly extensive, mostly on the anterior aspect of the leg.  All of them are covered with an adherent eschar.  This is going to be a difficult debridement.  She has been to see Dr. Tanda [name] at the wound care center in [place] before and so she is aware of the issues here.    ASSESSMENT/PLAN:  Severe venous stasis with venous hypertension, inflammation, and ulceration.  She is going to need a difficult debridement.  I will try to do this when I am next in the facility.  She is going to need reasonably copious amounts of topical lidocaine.  I will see if I can get her through this as much as I can.  Her previous ABIs in November were 0.31 on the right and 0.69 on the left.  The chances of healing these are not high.  However, we will at least do some mechanical debridement while she is here to see if any healing can be teased out of a very difficult situation.    CPT  CODE: [id number]

## 2014-05-14 NOTE — Progress Notes (Signed)
Patient ID: Tammy [name], female   DOB: [birth year], 80 y.o.   MRN: [id number] Facility; [facility] Chief complaint; right leg predominantly stasis ulceration History; this is a patient with severe bilateral venous stasis and also severe bilateral PAD. She apparently had skin tears the legs before she came to the hospital. She was admitted accountant from 7/237/6. This is predominantly due to increasing weakness with atrial fibrillation, CHF and superimposed pneumonia. I am seeing her today in followup from her leg wounds on the right. Physical examination; severe venous stasis physiology with venous hypertension, inflammation and ulceration. There were several smaller wounds however the large wound is on the anterior lateral right leg. All of these were anesthetized with topical lidocaine and debridement in a nonsurgical fashion with a curette to. We've been applying Santyl to these wounds and this  loosened  the eschar nicely. The debridement was tolerated well. There was no bleeding. She also has a smaller wound on the right toe [great toe]. I suspect this is probably ischemic and I am not going to to provide this further  Impression/plan #1 debridement of venous stasis ulcerations as noted above. We'll continue his Santyl based dressings however she may actually benefit from a change to college and at some point. A smaller wound superior to the large one actually has some depth.

## 2014-05-16 ENCOUNTER — Non-Acute Institutional Stay (SKILLED_NURSING_FACILITY): Payer: Medicare Other | Admitting: Internal Medicine

## 2014-05-16 DIAGNOSIS — I87331 Chronic venous hypertension (idiopathic) with ulcer and inflammation of right lower extremity: Principal | ICD-10-CM

## 2014-05-16 DIAGNOSIS — L97919 Non-pressure chronic ulcer of unspecified part of right lower leg with unspecified severity: Secondary | ICD-10-CM

## 2014-05-16 DIAGNOSIS — I87339 Chronic venous hypertension (idiopathic) with ulcer and inflammation of unspecified lower extremity: Secondary | ICD-10-CM

## 2014-05-16 DIAGNOSIS — L97909 Non-pressure chronic ulcer of unspecified part of unspecified lower leg with unspecified severity: Secondary | ICD-10-CM

## 2014-05-17 NOTE — Progress Notes (Addendum)
Patient ID: [name], female   DOB: 07-24-1920, 80 y.o.   MRN: [id number]               PROGRESS NOTE  DATE:  05/16/2014    FACILITY: [facility]    LEVEL OF CARE:   SNF   Acute Visit   CHIEF COMPLAINT:  Follow up right leg venous stasis ulcerations.    HISTORY OF PRESENT ILLNESS:  This is a delightful, 80 year-old woman who came to us with significant wounds on her right leg.  I did a mechanical debridement of these the other day.  We are continuing with a Santyl-based dressing.  There was some concern about some erythema below the larger wound inferiorly and I am following up on this today.      PHYSICAL EXAMINATION:   SKIN:  INSPECTION:  Right leg:  The wounds appear to be in a better state of debridement and I think this is a combination of both mechanical and enzymatic debridement.  She does have some mild erythema below the larger wound, although I see no evidence of spreading cellulitis at the moment.    ASSESSMENT/PLAN:  Venous stasis ulcerations in the setting of severe venous stasis with hypertension and ulceration.  She has some degree of chronic inflammation.  At this point, no change to the orders is made.   We will continue with the Santyl-based dressing.  Ultimately, with a further mechanical debridement next week, we may be able to change to collagen.      Severe PAD.  This has been previously evaluated as non-revascularizable by Vascular Surgery.  Nevertheless, I think there is a chance of getting some healing out of these wounds.

## 2014-05-21 ENCOUNTER — Non-Acute Institutional Stay (SKILLED_NURSING_FACILITY): Payer: Medicare Other | Admitting: Internal Medicine

## 2014-05-21 DIAGNOSIS — L97919 Non-pressure chronic ulcer of unspecified part of right lower leg with unspecified severity: Secondary | ICD-10-CM

## 2014-05-21 DIAGNOSIS — I87339 Chronic venous hypertension (idiopathic) with ulcer and inflammation of unspecified lower extremity: Secondary | ICD-10-CM

## 2014-05-21 DIAGNOSIS — I87331 Chronic venous hypertension (idiopathic) with ulcer and inflammation of right lower extremity: Principal | ICD-10-CM

## 2014-05-21 DIAGNOSIS — L97909 Non-pressure chronic ulcer of unspecified part of unspecified lower leg with unspecified severity: Secondary | ICD-10-CM

## 2014-05-23 ENCOUNTER — Non-Acute Institutional Stay (SKILLED_NURSING_FACILITY): Payer: Medicare Other | Admitting: Internal Medicine

## 2014-05-23 DIAGNOSIS — I5041 Acute combined systolic (congestive) and diastolic (congestive) heart failure: Secondary | ICD-10-CM

## 2014-05-23 DIAGNOSIS — I509 Heart failure, unspecified: Secondary | ICD-10-CM

## 2014-05-24 DIAGNOSIS — Z029 Encounter for administrative examinations, unspecified: Secondary | ICD-10-CM

## 2014-05-24 NOTE — Progress Notes (Addendum)
Patient ID: [name], female   DOB: [birth year], 80 y.o.   MRN: [id number]               PROGRESS NOTE  DATE:  05/21/2014    FACILITY: [facility]    LEVEL OF CARE:   SNF   Acute Visit   CHIEF COMPLAINT:  Follow up right leg venous stasis ulcerations.    HISTORY OF PRESENT ILLNESS:  This is a patient who came to us with significant wounds on her right leg.  She has severe bilateral venous stasis, also in the setting of very significant PAD which is non-revascularizable.  I had done a debridement on these areas earlier.  We have been using a Santyl-based dressing to these with a light wrap.  I am seeing her today in follow-up.    Once again, the patient complains of severe left greater than right heel pain and I also looked at her because of this, as well.     PHYSICAL EXAMINATION:   SKIN:  INSPECTION:  Right leg:  The wounds are in a better state of debridement and actually look quite good.  There is probably still some debridement to be done.   However, I am hopeful that she can do this with autolytic debridement under collagen-based dressings.    With regards to her heels, there seems to be point tenderness, especially on the left at the achilles insertion point on the left heel.  There is lesser degree of change and tenderness in the right heel.  There is no open area here.  No signs of pressure-type injury.  However, she does have significant PAD.  However, the degree of tenderness here makes me think this is probably bursitis.     ASSESSMENT/PLAN:  Venous stasis ulcerations.  I will change these to collagen/Hydrogel based dressings.    Bursitis at the achilles insertion point, especially on the left.   This is going to be a difficult thing to manage.  I am not wanting to inject this patient with all of her comorbidities.  She probably has some degree of mild kidney injury associated with being 80 years old.  Could consider a short course of nonsteroidals and this  may be the route I go here.

## 2014-05-25 ENCOUNTER — Encounter: Payer: Self-pay | Admitting: Internal Medicine

## 2014-05-25 NOTE — Progress Notes (Signed)
This encounter was created in error - please disregard.

## 2014-05-25 NOTE — Progress Notes (Addendum)
Patient ID: [name], female   DOB: Jun 03, 1920, 80 y.o.   MRN: [id number]               PROGRESS NOTE  DATE:  05/23/2014    FACILITY: [facility]    LEVEL OF CARE:   SNF   Acute Visit   CHIEF COMPLAINT:   Follow up CHF.    HISTORY OF PRESENT ILLNESS:  This is a patient who came to us after being hospitalized with a combination of CHF and superimposed pneumonia.  She came to us on high-flow oxygen with sats in the 88-90% range, 5 L.  We have been able to wean her down to off yesterday.   However, she is still  apparently low at night and is back on 1 L today.    Her atrial fibrillation has been controlled.    With regards to her diastolically-mediated heart failure, her weight has generally been stable in the 104-lb range.  She is only on Lasix 20 mg a day.    Her vital signs have otherwise been stable.    PHYSICAL EXAMINATION:   VITAL SIGNS:   PULSE:  In the high 50s to low 60s.   RESPIRATIONS:  18 and unlabored.    O2 SATURATIONS:  95% on her 1 L.   GENERAL APPEARANCE:  The patient is not in any distress.  As usual, she greets me in a warm, friendly fashion.   CHEST/RESPIRATORY:  Shallow, but clear, air entry.  She has a significant thoracic kyphosis.  No wheezing.   CARDIOVASCULAR:  CARDIAC:   Heart sounds are normal.  Her JVP is no longer visible.  It was visible when she came in.   EDEMA/VARICOSITIES:  Extremities:  She has venous stasis and some degree of edema which is chronic.  There is no coccyx edema.    ASSESSMENT/PLAN:  Diastolically-mediated heart failure.  She is on Lasix 20 mg a day.  Her BUN and creatinine have gone up slightly at 46 and 1.42 today versus 28 and 1.23, respectively, on 05/07/2014.   Nevertheless, I think she needs this degree of Lasix.  I will repeat this again in a week's time.  Her weights are stable.    High-flow oxygen.  She has been weaned down from 5 L to 1 L.  I used an incentive spirometer on her when she first came in.  I  think this may be related in some way to her severe thoracic kyphosis.  She was not on oxygen prior to arrival here.

## 2014-05-29 ENCOUNTER — Ambulatory Visit (HOSPITAL_COMMUNITY)
Admit: 2014-05-29 | Discharge: 2014-05-29 | Disposition: A | Discharge status: Home / Self Care | Payer: Medicare Other | Source: Ambulatory Visit | Attending: Internal Medicine | Admitting: Internal Medicine

## 2014-05-29 ENCOUNTER — Non-Acute Institutional Stay (SKILLED_NURSING_FACILITY): Payer: Medicare Other | Admitting: Internal Medicine

## 2014-05-29 ENCOUNTER — Encounter: Payer: Self-pay | Admitting: Internal Medicine

## 2014-05-29 DIAGNOSIS — R062 Wheezing: Secondary | ICD-10-CM | POA: Insufficient documentation

## 2014-05-29 DIAGNOSIS — M7989 Other specified soft tissue disorders: Secondary | ICD-10-CM | POA: Diagnosis not present

## 2014-05-29 DIAGNOSIS — J90 Pleural effusion, not elsewhere classified: Secondary | ICD-10-CM | POA: Diagnosis not present

## 2014-05-29 DIAGNOSIS — I517 Cardiomegaly: Secondary | ICD-10-CM | POA: Insufficient documentation

## 2014-05-29 DIAGNOSIS — I5030 Unspecified diastolic (congestive) heart failure: Secondary | ICD-10-CM

## 2014-05-29 DIAGNOSIS — R0602 Shortness of breath: Secondary | ICD-10-CM | POA: Insufficient documentation

## 2014-05-29 DIAGNOSIS — N183 Chronic kidney disease, stage 3 unspecified: Secondary | ICD-10-CM

## 2014-05-29 DIAGNOSIS — I509 Heart failure, unspecified: Secondary | ICD-10-CM

## 2014-05-29 NOTE — Progress Notes (Signed)
Patient ID: [name], female   DOB: 03/13/1920, 80 y.o.   MRN: [id number]   This is an acute visit.  Level of care skilled.  [facility]  Chief complaint -acute visit followup question lower extremity edema.  History of present illness.  Patient is a very pleasant 80 year old female who was hospitalized for increased weakness and found to be in atrial fibrillation she also had a fever and was treated for pneumonia.  Cardiac workup showed grade 2 diastolic dysfunction.  Initially she converted to sinus rhythm in continues on amiodarone per cardiology consult.  In regards to CHF she came in on 5 L of oxygen however she has done quite well and now is on 1-2 L.  About a month ago her Lasix was increased to 40 mg a day for 3 days and then back to her baseline 20 mg a day-over the  weekend she developed some increased edema and her Lasix was increased to 20 mg twice a day-.    It appears hepossibly gained a small amount of weight possibly 2 pounds the past several days--  Today however she says she feels fine the edema appears to be s improved apparently had sacral edema at one point this appears to be largely resolved today  Family medical social history has been reviewed per admission note on 05/02/2014 and subsequent progress notes   Med[facility]. I do note she is on amiodarone 200 milligrams a day as well as Lasix 20 mg a day  Review of systems.  In general no complaints of fever or chills.  Respirate does not complaining of shortness of breath she is on chronic oxygen again this has been titrated down some.  Cardiac does not complaining of chest pain has significant venous stasis and this is followed by wound care.  GI does not complaining of any abdominal pain nausea or vomiting.  Muscle skeletal is not complaining of joint pain again does have significant venous stasis.  Neurologic denied complaint of syncope or headache.  Physical  exam.  Temperature 97.4 pulse 62 respirations 20 blood pressure 140/91 weight is 107.1  In general this is a very pleasant elderly female in no distress sitting comfortably in her wheelchair.  Her skin is warm and dry she does have venous stasis changes with wounds lower extremities bilaterally this is followed by wound care.  Chest is largely clear to auscultation with some minimal amount of wheezing right upper lobe.  No labored breathing.  Heart is regular rate and rhythm without murmur gallop or rub-I would say she has 1+ lower extremity edema venous stasis changes bilaterally-I could not really appreciate much sacral edema today.  Abdomen is soft nontender with positive bowel sounds.  Muscle skeletal she does ambulate in a wheelchair moved her upper extremities at baseline with some lower extremity weakness.  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  Her speech is clear.  Psych she is alert and oriented very pleasant and appropriate.  Labs. 05/28/2014.  Sodium 143 potassium 4.1 BUN 46 creatinine 1.2.  BNP-828.9.  05/23/2014.  WBC 4.7 hemoglobin 8.6 platelets 270.  05/07/2014.  Sodium 140 potassium 4.4 BUN 28 creatinine 1.23  . Assessment and plan.  #1-history of edema-she has had some minimal weight gain-clinically appears to be roughly at her baseline no complaints of shortness of breath continues on chronic oxygen-I did note a small amount of wheezing in the right upper lung will  order a chest x-ray-BNP was mildly elevated-although I suspect this is chronic-.  At this point we'll continue to monitor she continues on Lasix 20 mg bid  #2 renal insufficiency her creatinine has crept up a bit but stable with last week's lab-update lab has been ordered for later this week this will have to be monitored with the increased dose of Lasix  At this point patient appears  stable but will bear close monitoring along with close  monitoring of her weight  [id number]         .      Marland Kitchen  Marland Kitchen

## 2014-05-30 ENCOUNTER — Non-Acute Institutional Stay (SKILLED_NURSING_FACILITY): Payer: Medicare Other | Admitting: Internal Medicine

## 2014-05-30 DIAGNOSIS — E039 Hypothyroidism, unspecified: Secondary | ICD-10-CM

## 2014-06-01 NOTE — Progress Notes (Signed)
Patient ID: [name], female   DOB: 05-22-1920, 80 y.o.   MRN: [id number]           PROGRESS NOTE  DATE: 05/30/2014         FACILITY:  [facility]  LEVEL OF CARE: SNF (31)  Acute Visit  CHIEF COMPLAINT:  Manage hypothyroidism.    HISTORY OF PRESENT ILLNESS: I was requested by the staff to assess the patient regarding above problem(s):  HYPOTHYROIDISM: The hypothyroidism remains stable. No complications noted from the medications presently being used.  The patient denies fatigue or constipation.  Last TSH:  Patient's TSH on 05/28/2014 was 6.26.    PAST MEDICAL HISTORY : Reviewed.  No changes/see problem list  CURRENT MEDICATIONS: Reviewed per MAR/see medication list  PHYSICAL EXAMINATION  VS: see VS section  GENERAL: no acute distress, normal body habitus NECK: supple, trachea midline, no neck masses, no thyroid tenderness, no thyromegaly CARDIAC: RRR, no murmur,no extra heart sounds, +1 bilateral lower extremity edema       ASSESSMENT/PLAN:  Hypothyroidism.  TSH slightly elevated.   We will not change the levothyroxine dose.  Check TSH in eight weeks.    CPT CODE: [id number]          Angela CoxGayani Y Dasanayaka, MD [facility] [phone]

## 2014-06-13 ENCOUNTER — Inpatient Hospital Stay (HOSPITAL_COMMUNITY): Payer: Medicare Other | Attending: Internal Medicine

## 2014-06-13 ENCOUNTER — Non-Acute Institutional Stay (SKILLED_NURSING_FACILITY): Payer: Medicare Other | Admitting: Internal Medicine

## 2014-06-13 DIAGNOSIS — R0902 Hypoxemia: Secondary | ICD-10-CM

## 2014-06-13 DIAGNOSIS — M79609 Pain in unspecified limb: Secondary | ICD-10-CM

## 2014-06-13 DIAGNOSIS — M79672 Pain in left foot: Principal | ICD-10-CM

## 2014-06-13 DIAGNOSIS — M79671 Pain in right foot: Secondary | ICD-10-CM

## 2014-06-14 NOTE — Progress Notes (Addendum)
Patient ID: Tammy [name], female   DOB: [birth year], 80 y.o.   MRN: [id number]               PROGRESS NOTE  DATE:  06/13/2014    FACILITY: [facility]    LEVEL OF CARE:   SNF   Acute Visit   CHIEF COMPLAINT:  Bilateral right greater than left heel pain, declining function in physical therapy.    HISTORY OF PRESENT ILLNESS:  This is a pleasant, 80 year-old who [facility] us [facility] pneumonia and/or a UTI and some degree of diastolic heart failure.  On arrival here, she had a large wound on the right anterior leg.  This has the appearance of a venous stasis ulceration, although she has known severe PAD that has been previously evaluated as non-revascularizable by Vascular Surgery.    The patient's major complaint  apparently over the last weeks has been bilateral heel pain, right greater than left.  This has been to the point where she does not feel that she can actually walk due to pain.   Although it would be easy to blame this on claudication, it is not really compatible with the history that she is providing.  She states that even when she rests the right heel on the bed, it is causing her discomfort, especially on the right side although, again, the complaints are sometimes bilateral.    REVIEW OF SYSTEMS:   CHEST/RESPIRATORY:  Respiratory status seems stable.  She is not complaining of excessive shortness of breath.  We have not managed to wean her off oxygen.   CARDIAC:   No clear chest pain.   GI:  No nausea or vomiting.   MUSCULOSKELETAL:  Heel pain as described above.    PHYSICAL EXAMINATION:   VITAL SIGNS:   O2 SATURATIONS:  92% on 2 L.   PULSE:  72.   RESPIRATIONS:  18.   GENERAL APPEARANCE:  The patient is not in any distress, although she is reluctant to put her right heel down on the bed.  CHEST/RESPIRATORY:  Shallow, but otherwise clear air entry bilaterally.   CARDIOVASCULAR:  CARDIAC:   Heart sounds are normal.  She appears to be  euvolemic.   CIRCULATION:   ARTERIAL:  Peripheral vascular:  There are no palpable pulses here.  Even the popliteal pulse on the right side is not palpable.   SKIN:  INSPECTION:  She has a large, irregular wound over the anterolateral aspect of the right leg.  This has an eschar.  We have been using Santyl.  I did a previous debridement on this.  There are other wounds on the lateral aspect of her left great toe and over the achilles area.  All of these look to be ischemic wounds and I really do not have much hope of any improvement here.   MUSCULOSKELETAL:   EXTREMITIES:   BILATERAL LOWER EXTREMITIES:  The major issue here appears to be marked tenderness in the right heel.  This appears to be in the plantar aspect of the heel, as well as in the area of the insertion of the achilles tendon posteriorly.  There are less impressive degrees of tenderness on the plantar aspect of the left heel, but still some tenderness here.    ASSESSMENT/PLAN:  Bilateral heel pain, right greater than left.  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  Posteriorly, this could be a tendinopathy or a enthesopathy.  I do not think this is going to be easy to fix.  However, it is going to require aggressive offloading, which I will attempt to provide.  Whether some of this lady's pain is  ischemic or not is difficult to be certain, although this has already been evaluated by Vascular Surgery as a non-revascularizable condition.    Chronic hypoxemia.  I am not certain whether this patient has obstructive lung disease or some of this is due to her severe thoracic kyphosis.  However, we have not been able to wean her off oxygen.    Poor performance in physical therapy.  I watched this lady transfer from her wheelchair to the bed.  Per my exam, she literally is not putting her right foot on the floor.  I am not optimistic at this point that she will be able to go back to her previous level of function, which appears  to have been short distances in her home.  Nevertheless, we will see what we can do.  I will go ahead and x-ray the right heel, although I am not expecting to see anything here. Podiatry review might be in order

## 2014-06-20 ENCOUNTER — Ambulatory Visit (HOSPITAL_COMMUNITY)
Admit: 2014-06-20 | Discharge: 2014-06-20 | Disposition: A | Discharge status: Home / Self Care | Payer: Medicare Other | Source: Skilled Nursing Facility | Attending: Internal Medicine | Admitting: Internal Medicine

## 2014-06-20 ENCOUNTER — Non-Acute Institutional Stay (SKILLED_NURSING_FACILITY): Payer: Medicare Other | Admitting: Internal Medicine

## 2014-06-20 DIAGNOSIS — X58XXXA Exposure to other specified factors, initial encounter: Secondary | ICD-10-CM | POA: Insufficient documentation

## 2014-06-20 DIAGNOSIS — S8990XA Unspecified injury of unspecified lower leg, initial encounter: Secondary | ICD-10-CM | POA: Insufficient documentation

## 2014-06-20 DIAGNOSIS — M25579 Pain in unspecified ankle and joints of unspecified foot: Secondary | ICD-10-CM | POA: Insufficient documentation

## 2014-06-20 DIAGNOSIS — I509 Heart failure, unspecified: Secondary | ICD-10-CM | POA: Insufficient documentation

## 2014-06-20 DIAGNOSIS — S81009A Unspecified open wound, unspecified knee, initial encounter: Secondary | ICD-10-CM

## 2014-06-20 DIAGNOSIS — S99929A Unspecified injury of unspecified foot, initial encounter: Principal | ICD-10-CM

## 2014-06-20 DIAGNOSIS — R0989 Other specified symptoms and signs involving the circulatory and respiratory systems: Secondary | ICD-10-CM | POA: Diagnosis not present

## 2014-06-20 DIAGNOSIS — I5030 Unspecified diastolic (congestive) heart failure: Secondary | ICD-10-CM

## 2014-06-20 DIAGNOSIS — N289 Disorder of kidney and ureter, unspecified: Secondary | ICD-10-CM

## 2014-06-20 DIAGNOSIS — J449 Chronic obstructive pulmonary disease, unspecified: Secondary | ICD-10-CM | POA: Insufficient documentation

## 2014-06-20 DIAGNOSIS — S91009A Unspecified open wound, unspecified ankle, initial encounter: Secondary | ICD-10-CM

## 2014-06-20 DIAGNOSIS — S81809A Unspecified open wound, unspecified lower leg, initial encounter: Secondary | ICD-10-CM

## 2014-06-20 DIAGNOSIS — S99919A Unspecified injury of unspecified ankle, initial encounter: Principal | ICD-10-CM

## 2014-06-20 DIAGNOSIS — J90 Pleural effusion, not elsewhere classified: Secondary | ICD-10-CM | POA: Diagnosis not present

## 2014-06-20 DIAGNOSIS — J4489 Other specified chronic obstructive pulmonary disease: Secondary | ICD-10-CM | POA: Insufficient documentation

## 2014-06-20 DIAGNOSIS — S91002A Unspecified open wound, left ankle, initial encounter: Secondary | ICD-10-CM | POA: Insufficient documentation

## 2014-06-20 NOTE — Progress Notes (Signed)
Patient ID: [name], female   DOB: [birth year], 80 y.o.   MRN: [id number]   This is an acute visit.  Level of care skilled.  Facility [facility] .  Chief complaint -acute visit followup left ankle wound --CHF   History of present illness.  Patient is a very pleasant 80 year old female who was hospitalized for increased weakness and found to be in atrial fibrillation she also had a fever and was treated for pneumonia.  Cardiac workup showed grade 2 diastolic dysfunction.  Initially she converted to sinus rhythm in continues on amiodarone per cardiology consult.  In regards to CHF she came in on 5 L of oxygen however she has done quite well and now is on 1-2 L.  A few weeks ago her Lasix was increased to 40 mg a day for 3 days and then back to her baseline 20 mg a day-. However it appeared she gained a small amount of edema and weight and Lasix was increased to 20 mg twice a day earlier this month that since then her weight appears to have stabilized currently 102 which is down about 3 pounds since level earlier this month A chest x-ray was done at the time which showed mild cardiomegaly vascular congestion and small to moderate left and small right pleural effusions-right middle lobe and retrocardiac consolidation reflecting questionable edema atelectasis or pneumonia-she continues to be afebrile and does not appear to be overtly symptomatic-again she actually has lost about 3 pounds in the interim since her Lasix was increased -- is not complaining of any cough or fever or any overt signs of pneumonia  She also has a history of bilateral leg wounds this has been followed by Dr. Leanord Hawking who debrided the wound on her right leg this is currently covered with dressing-wound care nurse has asked me to look at the lateral left ankle area there is a history of a wound here there is some necrosis-wound care has been monitoring this however apparently there is some tenderness to the area today --     Family  medical social history has been reviewed per admission note on 05/02/2014 and subsequent progress notes--including 05/29/2014   Medications have been reviewed per [facility]. I do note she is on amiodarone 200 milligrams a day as well as Lasix 20 mg BID   Review of systems.  In general no complaints of fever or chills.  Skin -- issues as noted above Respiratory does not complaining of shortness of breath she is on chronic oxygen again this has been titrated down some.  Cardiac does not complaining of chest pain has significant venous stasis and this is followed by wound care.  GI does not complaining of any abdominal pain nausea or vomiting.  Muscle skeletal is not complaining of joint pain again does have significant venous stasis.  Neurologic denied complaint of syncope or headache.    Physical exam.  Temperature 97.8 pulse 63 respirations 18 blood pressure 140/48-120/48 most recently weight is 102.3 this actually appears to be down about 5 pounds since earlier this month  In general this is a very pleasant elderly female in no distress sitting comfortably in her wheelchair.  Her skin is warm and dry she does have venous stasis changes with wounds lower extremities bilaterally this is followed by wound care--wound on the left ankle laterally-there is a necrotic area there is no significant surrounding erythema or drainage-however there is tenderness to palpation of the area. Right lowr leg is currently covered with dry  dressing again this has been followed by wound care and Dr. Leanord Hawking  Chest is largely clear to auscultation.  No labored breathing.  Heart is regular rate and rhythm without murmur gallop or rub-I would say she has  trace 1+ lower extremity edema venous stasis changes bilaterally-I could not really appreciate much sacral edema today--exam of right leg somewhat limited secondary to dressing and protective boot.  Abdomen is soft nontender with positive bowel sounds.  Muscle skeletal she  does ambulate in a wheelchair moved her upper extremities at baseline with some lower extremity weakness.  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  Her speech is clear.  Psych she is alert and oriented very pleasant and appropriate.      Labs 05/30/2014.  Sodium 143 potassium 3.8 BUN 38 creatinine 1.49-.  05/28/2014.  BNP 828.9.  8-3- 2015.  TSH 6.26.     . 05/28/2014.  Sodium 143 potassium 4.1 BUN 46 creatinine 1.2.  BNP-828.9.  05/23/2014.  WBC 4.7 hemoglobin 8.6 platelets 270.  05/07/2014.  Sodium 140 potassium 4.4 BUN 28 creatinine 1.23   . Assessment and plan.   #1-history of edema-history of CHF-her Lasix has been increased this appears to be stable -.  At this point we'll continue to monitor she continues on Lasix 20 mg bid--she will need a BMP tomorrow to monitor renal issues and electrolytes  Also will order followup chest x-ray to one done earlier this month although clinically she appears to be doing relatively well    #2 renal insufficiency--it appears her creatinine on recent lab has crept up a bit will again order a metabolic panel to keep an eye on this.  #3-history of left ankle  wound-- this was assessed with the wound care nurse-will order an x-ray-also will start empiric doxycycline 100 mg twice a day for 10 days--also will discuss topical treatment with wound care nurse-this was discussed with Dr. Leanord Hawking via phone.    [id number]

## 2014-06-25 ENCOUNTER — Other Ambulatory Visit: Payer: Self-pay | Admitting: *Deleted

## 2014-06-25 MED ORDER — TRAMADOL HCL 50 MG PO TABS: ORAL_TABLET | ORAL | Status: DC

## 2014-06-25 NOTE — Telephone Encounter (Signed)
[facility] 

## 2014-06-27 ENCOUNTER — Non-Acute Institutional Stay (SKILLED_NURSING_FACILITY): Payer: Medicare Other | Admitting: Internal Medicine

## 2014-06-27 ENCOUNTER — Encounter: Payer: Self-pay | Admitting: Internal Medicine

## 2014-06-27 DIAGNOSIS — G609 Hereditary and idiopathic neuropathy, unspecified: Secondary | ICD-10-CM

## 2014-06-27 DIAGNOSIS — I5030 Unspecified diastolic (congestive) heart failure: Secondary | ICD-10-CM

## 2014-06-27 DIAGNOSIS — E039 Hypothyroidism, unspecified: Secondary | ICD-10-CM

## 2014-06-27 DIAGNOSIS — IMO0001 Reserved for inherently not codable concepts without codable children: Secondary | ICD-10-CM

## 2014-06-27 DIAGNOSIS — G629 Polyneuropathy, unspecified: Secondary | ICD-10-CM

## 2014-06-27 DIAGNOSIS — D649 Anemia, unspecified: Secondary | ICD-10-CM

## 2014-06-27 DIAGNOSIS — I10 Essential (primary) hypertension: Secondary | ICD-10-CM

## 2014-06-27 DIAGNOSIS — I509 Heart failure, unspecified: Secondary | ICD-10-CM

## 2014-06-27 DIAGNOSIS — I482 Chronic atrial fibrillation, unspecified: Secondary | ICD-10-CM

## 2014-06-27 DIAGNOSIS — I4891 Unspecified atrial fibrillation: Secondary | ICD-10-CM

## 2014-06-27 DIAGNOSIS — J449 Chronic obstructive pulmonary disease, unspecified: Secondary | ICD-10-CM

## 2014-06-27 NOTE — Progress Notes (Signed)
Patient ID: [name], female   DOB: 12/08/1919, 80 y.o.   MRN: [id number]   This is a discharge note.  Level of care skilled.  Facility [facility]  .  Chief complaint -discharge note   History of present illness.  Patient is a very pleasant 80 year old female who was hospitalized for increased weakness and found to be in atrial fibrillation she also had a fever and was treated for pneumonia.  Cardiac workup showed grade 2 diastolic dysfunction.  Initially she converted to sinus rhythm in continues on amiodarone per cardiology consult.  In regards to CHF she came in on 5 L of oxygen however she has done quite well and now is essentially off oxygen.  A few weeks ago her Lasix was increased to 40 mg a day for 3 days and then back to her baseline 20 mg a day-. However it appeared she gained a small amount of edema and weight and Lasix was increased to 20 mg twice a daylast month that since then her weight appears to have stabilized currently around 103   A chest x-ray was done at the time which showed mild cardiomegaly vascular congestion and small to moderate left and small right pleural effusions-right middle lobe and retrocardiac consolidation reflecting questionable edema atelectasis or pneumonia-she continues to be afebrile and does not appear to be overtly symptomatic- -- is not complaining of any cough or fever or any overt signs of pneumonia  She also has a history of bilateral leg wounds this has been followed by Dr. Leanord Hawking who debrided the wound on her right leg at one point  --Her wound care nurse this is stable-I also started her about 10 days ago on an antibiotic for a small open area on the left ankle this appears to be stable as well she is finishing a course of doxycycline-she will have home health support wound care nursing to followup on this Again clinically she appears to be doing quite well and as she will be going to an assisted living facility-she would benefit from continued PT  and OT as well of course nursing for wound care followup --  Family medical social history has been reviewed per admission note on 05/02/2014 and subsequent progress notes--including 06/18/2014--   Medications have been reviewed per [facility]. I do note she is on amiodarone 200 milligrams a day as well as Lasix 20 mg BID   Review of systems.  In general no complaints of fever or chills.  Skin -- issues as noted above  Respiratory does not complaining of shortness of breath .  or cough now she is actually no longer requiring oxygen to any significant extent  Chest  is not omplaining of chest pain has significant venous stasis and this is followed by wound care.  GI does not complaining of any abdominal pain nausea or vomiting.  Muscle skeletal is not complaining of joint pain again does have significant venous stasis.  Neurologic denied complaint of syncope or headache.   Physical exam.   Temperature 97.7 pulse 64 respirations 20 blood pressure 137/53 weight is one of 3.9 O2 saturation is in the 90s on room air  In general this is a very pleasant elderly female in no distress sitting comfortably in her wheelchair.  Her skin is warm and dry she does have venous stasis changes with wounds lower extremities-- bilaterally this is followed by wound care--wound on the left ankle laterally-there is a samll open area there is no significant surrounding erythema or  drainage- There are some open areas on the right leg they are not really draining or bleeding or sign of infection again she has significant venous stasis here and this has been followed by wound care and has been stable although will need close followup by wound care nurse at the outside facility.     Chest is largely clear to auscultation.  No labored breathing .  Heart is regular rate and rhythm without murmur gallop or rub-I would say she has trace  lower extremity edema venous stasis changes bilaterally-  Abdomen is soft nontender with  positive bowel sounds.  Muscle skeletal she does ambulate in a wheelchair moved her upper extremities at baseline with some lower extremity weakness.  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  Her speech is clear.    Psych she is alert and oriented very pleasant and appropriate .  Labs 06/21/2014.  WBC 7.0 hemoglobin 8.9 platelets 361.  Sodium 138 potassium 4 BUN 38 creatinine 1.49.  05/28/2014.  TSH at 6.26.  [id number]    05/30/2014.  Sodium 143 potassium 3.8 BUN 38 creatinine 1.49-.   .  . 05/28/2014.  Sodium 143 potassium 4.1 BUN 46 creatinine 1.2.  BNP-828.9.  05/23/2014.  WBC 4.7 hemoglobin 8.6 platelets 270.  05/07/2014.  Sodium 140 potassium 4.4 BUN 28 creatinine 1.23   . Assessment and plan.  #1-history of edema-history of CHF-her Lasix has been increased this appears to be stable  -.  At this point we'll continue to monitor she continues on Lasix 20 mg bid--she will need ametabolic panel tomorrow to monitor renal issues and electrolytes--notify primary care provider for results     #2 renal insufficiency--it appears her creatinine on recent lab has crept up a bit and then stabilized will again order a metabolic panel to keep an eye on this.   #3-history of left ankle wound--as appears somewhat improved as she is finishing a course of doxycycline and will merit wound care followup as an outpatient aather new facility  #4-pneumonia-this appears clinically resolved she does have a chronic history of COPD and bilateral pleural effusions that appears to be unchanged per the most recent x-ray on August 26 She does continue on nebulizers as needed  #5-hypertension-this appears to be stable on Catapres. As well as Norvasc  #6-hypothyroidism she is on Synthroid TSH was mildly elevated most recent lab again physician advised keeping same dose and rechecking this in about 4 weeks this will need  followup by her primary care provider  #7 as atrial fibrillation this appears rate controlled I do see occasional pulse is in the 50s but she has been asymptomatic follow up with primary care provider as needed.  She is on aspirin for anticoagulation.  #8-history of anemia thought to be deficiencies this appears to have slowly improved we'll update CBC before discharge notify primary care provider of results.  #9-history of neuropathy she continues on Neurontin she is not complaining of overt symptoms today.  #10 depression this appears stable she is on Effexor.  Of note again she will be going to an assisted living center will need home care followup as well as continued PT and OT secondary to some significant lower extremity weakness still ambulating largely in a wheelchair.  [id number] note greater than 30 minutes spent on this discharge summary   e.

## 2014-06-29 ENCOUNTER — Telehealth: Payer: Self-pay | Admitting: *Deleted

## 2014-06-29 MED ORDER — OXYBUTYNIN CHLORIDE 5 MG PO TABS: 2.5000 mg | ORAL_TABLET | Freq: Two times a day (BID) | ORAL | Status: DC

## 2014-06-29 MED ORDER — SOLIFENACIN SUCCINATE 5 MG PO TABS: 5.0000 mg | ORAL_TABLET | Freq: Every day | ORAL | Status: DC

## 2014-06-29 NOTE — Telephone Encounter (Signed)
Prescription sent to pharmacy.

## 2014-06-29 NOTE — Telephone Encounter (Signed)
Received call from pharmacy.   Reports that patient has been discharged from [facility] back to [address], [place]. Reports that patient has new order for Myrbetriq from MD at [facility]. Reports that insurance does not cover.   MD please advise.

## 2014-06-29 NOTE — Telephone Encounter (Signed)
Received call from [facility] with [employer].   Reports that patient insurance will not cover Vesicare.   [employer] formulary considered.   Oxybutynin covered.   MD made aware and new orders obtained.   Prescription sent to pharmacy.

## 2014-06-29 NOTE — Telephone Encounter (Signed)
Since patient will be an assisted living facility that she can try one or the other bladder medications send Vesicare  5 at bedtime

## 2014-07-01 ENCOUNTER — Emergency Department (HOSPITAL_COMMUNITY): Payer: Medicare Other

## 2014-07-01 ENCOUNTER — Emergency Department (HOSPITAL_COMMUNITY)
Admission: EM | Admit: 2014-07-01 | Discharge: 2014-07-01 | Disposition: A | Discharge status: Home / Self Care | Payer: Medicare Other | Attending: Emergency Medicine | Admitting: Emergency Medicine

## 2014-07-01 DIAGNOSIS — S41009A Unspecified open wound of unspecified shoulder, initial encounter: Secondary | ICD-10-CM | POA: Insufficient documentation

## 2014-07-01 DIAGNOSIS — S4980XA Other specified injuries of shoulder and upper arm, unspecified arm, initial encounter: Secondary | ICD-10-CM | POA: Diagnosis present

## 2014-07-01 DIAGNOSIS — Y9389 Activity, other specified: Secondary | ICD-10-CM | POA: Insufficient documentation

## 2014-07-01 DIAGNOSIS — I498 Other specified cardiac arrhythmias: Secondary | ICD-10-CM | POA: Insufficient documentation

## 2014-07-01 DIAGNOSIS — R404 Transient alteration of awareness: Secondary | ICD-10-CM | POA: Diagnosis not present

## 2014-07-01 DIAGNOSIS — S91009A Unspecified open wound, unspecified ankle, initial encounter: Secondary | ICD-10-CM | POA: Diagnosis not present

## 2014-07-01 DIAGNOSIS — F329 Major depressive disorder, single episode, unspecified: Secondary | ICD-10-CM | POA: Insufficient documentation

## 2014-07-01 DIAGNOSIS — D649 Anemia, unspecified: Secondary | ICD-10-CM | POA: Diagnosis not present

## 2014-07-01 DIAGNOSIS — M810 Age-related osteoporosis without current pathological fracture: Secondary | ICD-10-CM | POA: Diagnosis not present

## 2014-07-01 DIAGNOSIS — S81809A Unspecified open wound, unspecified lower leg, initial encounter: Secondary | ICD-10-CM

## 2014-07-01 DIAGNOSIS — E079 Disorder of thyroid, unspecified: Secondary | ICD-10-CM | POA: Insufficient documentation

## 2014-07-01 DIAGNOSIS — IMO0002 Reserved for concepts with insufficient information to code with codable children: Secondary | ICD-10-CM

## 2014-07-01 DIAGNOSIS — S81009A Unspecified open wound, unspecified knee, initial encounter: Secondary | ICD-10-CM | POA: Insufficient documentation

## 2014-07-01 DIAGNOSIS — I10 Essential (primary) hypertension: Secondary | ICD-10-CM | POA: Diagnosis not present

## 2014-07-01 DIAGNOSIS — Y929 Unspecified place or not applicable: Secondary | ICD-10-CM | POA: Insufficient documentation

## 2014-07-01 DIAGNOSIS — F3289 Other specified depressive episodes: Secondary | ICD-10-CM | POA: Diagnosis not present

## 2014-07-01 DIAGNOSIS — Z79899 Other long term (current) drug therapy: Secondary | ICD-10-CM | POA: Insufficient documentation

## 2014-07-01 DIAGNOSIS — M129 Arthropathy, unspecified: Secondary | ICD-10-CM | POA: Diagnosis not present

## 2014-07-01 DIAGNOSIS — W06XXXA Fall from bed, initial encounter: Secondary | ICD-10-CM | POA: Insufficient documentation

## 2014-07-01 DIAGNOSIS — Z792 Long term (current) use of antibiotics: Secondary | ICD-10-CM | POA: Diagnosis not present

## 2014-07-01 DIAGNOSIS — Z7982 Long term (current) use of aspirin: Secondary | ICD-10-CM | POA: Insufficient documentation

## 2014-07-01 DIAGNOSIS — Z8719 Personal history of other diseases of the digestive system: Secondary | ICD-10-CM | POA: Insufficient documentation

## 2014-07-01 DIAGNOSIS — S41109A Unspecified open wound of unspecified upper arm, initial encounter: Secondary | ICD-10-CM | POA: Insufficient documentation

## 2014-07-01 DIAGNOSIS — S46909A Unspecified injury of unspecified muscle, fascia and tendon at shoulder and upper arm level, unspecified arm, initial encounter: Secondary | ICD-10-CM | POA: Insufficient documentation

## 2014-07-01 DIAGNOSIS — Z8669 Personal history of other diseases of the nervous system and sense organs: Secondary | ICD-10-CM | POA: Diagnosis not present

## 2014-07-01 NOTE — ED Notes (Signed)
Pt is from [place] in [place] where she apparently fell this morning, has multiple skin tears, no head injury.  Pt is awake and alert.

## 2014-07-01 NOTE — ED Provider Notes (Signed)
CSN: [id number]     Arrival date & time 07/01/14  0223 History   First MD Initiated Contact with Patient 07/01/14 [phone]     Chief Complaint  Patient presents with  . Fall     (Consider location/radiation/quality/duration/timing/severity/associated sxs/prior Treatment) Patient is a 80 y.o. female presenting with fall. The history is provided by the patient.  Fall  She states that she rolled out of her bed and fell on the floor. She does not think she hit her head and is not responsive consciousness. She is complaining of pain in her left shoulder. Pain is moderate and she rates it a 4/10. She suffered multiple skin tears.  Past Medical History  Diagnosis Date  . Hypertension   . Thyroid disease   . Depression   . Cataract   . Osteoporosis   . IBS (irritable bowel syndrome)   . Anemia   . Hearing loss   . Bilateral leg edema   . Stasis ulcer of lower extremity     followed by wound care  . Peripheral neuropathy   . Critical lower limb ischemia   . Arthritis    Past Surgical History  Procedure Laterality Date  . Back surgery x2    . Neck surgery    . Hip surgery    . Eye surgery      CATARACT REMOVAL  . Abdominal hysterectomy     Family History  Problem Relation Age of Onset  . Hypertension Mother   . Hypertension Father   . Stroke Father   . Heart disease Brother   . Heart disease Brother    History  Substance Use Topics  . Smoking status: Never Smoker   . Smokeless tobacco: Never Used  . Alcohol Use: No     Comment: occasional   OB History   Grav Para Term Preterm Abortions TAB SAB Ect Mult Living                 Review of Systems  All other systems reviewed and are negative.     Allergies  Acetaminophen-codeine and Sulfa antibiotics  Home Medications   Prior to Admission medications   Medication Sig Start Date End Date Taking? Authorizing Provider  amiodarone (PACERONE) 200 MG tablet Take 1 tablet (200 mg total) by mouth daily. 05/19/14  Yes  Maryann Mikhail, DO  amLODipine (NORVASC) 5 MG tablet Take 1 tablet (5 mg total) by mouth daily. 09/27/13  Yes Salley Scarlet, MD  aspirin EC 81 MG tablet Take 81 mg by mouth every morning.    Yes Historical Provider, MD  Calcium-Vitamin D (CALTRATE 600 PLUS-VIT D PO) Take 1 tablet by mouth 2 (two) times daily.     Yes Historical Provider, MD  cloNIDine (CATAPRES) 0.1 MG tablet Take 1 tablet (0.1 mg total) by mouth at bedtime. 04/24/14  Yes Salley Scarlet, MD  collagenase (SANTYL) ointment Apply topically daily. 04/30/14  Yes Maryann Mikhail, DO  ferrous sulfate 325 (65 FE) MG tablet Take 1 tablet (325 mg total) by mouth 2 (two) times daily with a meal. 04/30/14  Yes Maryann Mikhail, DO  furosemide (LASIX) 20 MG tablet TAKE ONE TABLET DAILY. 04/23/14  Yes Salley Scarlet, MD  gabapentin (NEURONTIN) 300 MG capsule TAKE (1) CAPSULE BY MOUTH IN THE MORNING AND (2) CAPSULES AT BEDTIME. 02/15/14  Yes Salley Scarlet, MD  levothyroxine (SYNTHROID, LEVOTHROID) 50 MCG tablet TAKE ONE TABLET DAILY. 01/09/14  Yes Salley Scarlet, MD  Multiple Vitamins-Minerals [phone]  MULTIVITAMIN PLUS) TABS Take 1 tablet by mouth every morning.    Yes Historical Provider, MD  Polyethyl Glycol-Propyl Glycol (SYSTANE OP) Apply 1-2 drops to eye daily.   Yes Historical Provider, MD  polyethylene glycol (MIRALAX / GLYCOLAX) packet Take 17 g by mouth daily. 04/30/14  Yes Maryann Mikhail, DO  traMADol (ULTRAM) 50 MG tablet Take one tablet by mouth twice daily as needed for pain 06/25/14  Yes Tiffany L Reed, DO  venlafaxine (EFFEXOR) 37.5 MG tablet TAKE ONE TABLET DAILY. 04/23/14  Yes Salley Scarlet, MD  ampicillin (PRINCIPEN) 500 MG capsule Take 500 mg by mouth 4 (four) times daily.    Historical Provider, MD  antiseptic oral rinse (BIOTENE) LIQD 15 mLs by Mouth Rinse route 2 (two) times daily. 04/30/14   Maryann Mikhail, DO  levofloxacin (LEVAQUIN) 500 MG tablet Take 1 tablet (500 mg total) by mouth every other day. 05/02/14   Maryann  Mikhail, DO  oxybutynin (DITROPAN) 5 MG tablet Take 0.5 tablets (2.5 mg total) by mouth 2 (two) times daily. 06/29/14   Salley Scarlet, MD  triamcinolone cream (KENALOG) 0.1 % Apply 1 application topically every other day.  06/15/13   Historical Provider, MD   BP 147/50  Pulse 59  Temp(Src) 98.3 F (36.8 C) (Oral)  Resp 20  Ht  (1.499 m)  Wt 104 lb (47.174 kg)  BMI 20.99 kg/m2  SpO2 96% Physical Exam  Nursing note and vitals reviewed.  80 year old female, resting comfortably and in no acute distress. Vital signs are significant for mild hypertension, mild bradycardia. Oxygen saturation is 96%, which is normal. Head is normocephalic and atraumatic. PERRLA. Eyes are disconjugate with left eye deviating laterally. Oropharynx is clear. Neck is nontender without adenopathy or JVD. Back is nontender and there is no CVA tenderness. Lungs are clear without rales, wheezes, or rhonchi. Chest is nontender. Heart has regular rate and rhythm without murmur. Abdomen is soft, flat, nontender without masses or hepatosplenomegaly and peristalsis is normoactive. Extremities: Skin tears are present on the left shoulder, right upper arm, and both knees. Full range of motion is present in all joints. Skin is warm and dry without rash. Neurologic: Mental status is normal, cranial nerves are intact, there are no motor or sensory deficits.  ED Course  Procedures (including critical care time)  Imaging Review Ct Head Wo Contrast  07/01/2014   CLINICAL DATA:  Fall.  Multiple skin tear is.  EXAM: CT HEAD WITHOUT CONTRAST  CT CERVICAL SPINE WITHOUT CONTRAST  TECHNIQUE: Multidetector CT imaging of the head and cervical spine was performed following the standard protocol without intravenous contrast. Multiplanar CT image reconstructions of the cervical spine were also generated.  COMPARISON:  MRI cervical spine 12/19/2007  FINDINGS: CT HEAD FINDINGS  Diffuse cerebral atrophy. Low-attenuation changes in the  deep white matter consistent with small vessel ischemia. Calcifications along the falx and tentorium. No mass effect or midline shift. No abnormal extra-axial fluid collections. Gray-white matter junctions are distinct. Basal cisterns are not effaced. No evidence of acute intracranial hemorrhage. No depressed skull fractures. Visualized paranasal sinuses and mastoid air cells are not opacified.  CT CERVICAL SPINE FINDINGS  Postoperative changes with posterior laminectomies from C3 through C6. Fusion of posterior elements and facet joints may be degenerative or due to bone grafting. Degenerative changes throughout the cervical spine with narrowed cervical interspaces and endplate hypertrophic changes. Slight anterior subluxation of C4 on C5 appears stable since previous study. Reversal of the usual cervical  lordosis is nonspecific and may be due to patient positioning but ligamentous injury or muscle spasm are not excluded. Facet joints appear to be normally aligned. No vertebral compression deformities. No prevertebral soft tissue swelling. C1-2 articulation appears intact. Diffuse bone demineralization. No focal bone lesion or bone destruction is appreciated. Soft tissues are unremarkable.  Lung apices demonstrated a left pleural effusion. Diffuse interstitial pattern in the lung apices likely represents edema or fibrosis. Scarring in the right apex.  IMPRESSION: No acute intracranial abnormalities. Chronic atrophy and small vessel ischemic changes.  Postoperative and degenerative changes in the cervical spine. Nonspecific reversal of the usual cervical lordosis. No displaced acute fractures are demonstrated.  Left pleural effusion with diffuse interstitial pattern in the lung apices, likely edema.   Electronically Signed   By: Burman Nieves M.D.   On: 07/01/2014 05:01   Ct Cervical Spine Wo Contrast  07/01/2014   CLINICAL DATA:  Fall.  Multiple skin tear is.  EXAM: CT HEAD WITHOUT CONTRAST  CT CERVICAL SPINE  WITHOUT CONTRAST  TECHNIQUE: Multidetector CT imaging of the head and cervical spine was performed following the standard protocol without intravenous contrast. Multiplanar CT image reconstructions of the cervical spine were also generated.  COMPARISON:  MRI cervical spine 12/19/2007  FINDINGS: CT HEAD FINDINGS  Diffuse cerebral atrophy. Low-attenuation changes in the deep white matter consistent with small vessel ischemia. Calcifications along the falx and tentorium. No mass effect or midline shift. No abnormal extra-axial fluid collections. Gray-white matter junctions are distinct. Basal cisterns are not effaced. No evidence of acute intracranial hemorrhage. No depressed skull fractures. Visualized paranasal sinuses and mastoid air cells are not opacified.  CT CERVICAL SPINE FINDINGS  Postoperative changes with posterior laminectomies from C3 through C6. Fusion of posterior elements and facet joints may be degenerative or due to bone grafting. Degenerative changes throughout the cervical spine with narrowed cervical interspaces and endplate hypertrophic changes. Slight anterior subluxation of C4 on C5 appears stable since previous study. Reversal of the usual cervical lordosis is nonspecific and may be due to patient positioning but ligamentous injury or muscle spasm are not excluded. Facet joints appear to be normally aligned. No vertebral compression deformities. No prevertebral soft tissue swelling. C1-2 articulation appears intact. Diffuse bone demineralization. No focal bone lesion or bone destruction is appreciated. Soft tissues are unremarkable.  Lung apices demonstrated a left pleural effusion. Diffuse interstitial pattern in the lung apices likely represents edema or fibrosis. Scarring in the right apex.  IMPRESSION: No acute intracranial abnormalities. Chronic atrophy and small vessel ischemic changes.  Postoperative and degenerative changes in the cervical spine. Nonspecific reversal of the usual  cervical lordosis. No displaced acute fractures are demonstrated.  Left pleural effusion with diffuse interstitial pattern in the lung apices, likely edema.   Electronically Signed   By: Burman Nieves M.D.   On: 07/01/2014 05:01   Dg Shoulder Left  07/01/2014   CLINICAL DATA:  Fall, left shoulder pain.  EXAM: LEFT SHOULDER - 2+ VIEW  COMPARISON:  None.  FINDINGS: There is no evidence of fracture or dislocation. There is no evidence of arthropathy or other focal bone abnormality. Bone mineral density is decreased. Soft tissues are unremarkable.  IMPRESSION: No acute fracture deformity or dislocation.  Osteopenia, which decreases sensitivity for acute nondisplaced fractures.   Electronically Signed   By: Awilda Metro   On: 07/01/2014 05:01   MDM   Final diagnoses:  Fall from bed, initial encounter  Skin tear    Fall  with multiple skin tears but no other apparent injury. She will be sent for x-rays of her left shoulder and CT of head and cervical spine. Old records are reviewed and she had TDaP immunization earlier this year.  CT and x-rays are unremarkable. She is discharged with instructions on care of skin tears.      Dione Booze, MD 07/01/14 [phone]

## 2014-07-01 NOTE — Discharge Instructions (Signed)
Skin Tear Care °A skin tear is a wound in which the top layer of skin has peeled off. This is a common problem with aging because the skin becomes thinner and more fragile as a person gets older. In addition, some medicines, such as oral corticosteroids, can lead to skin thinning if taken for long periods of time.  °A skin tear is often repaired with tape or skin adhesive strips. This keeps the skin that has been peeled off in contact with the healthier skin beneath. Depending on the location of the wound, a bandage (dressing) may be applied over the tape or skin adhesive strips. Sometimes, during the healing process, the skin turns black and dies. Even when this happens, the torn skin acts as a good dressing until the skin underneath gets healthier and repairs itself. °HOME CARE INSTRUCTIONS  °· Change dressings once per day or as directed by your caregiver. °· Gently clean the skin tear and the area around the tear using saline solution or mild soap and water. °· Do not rub the injured skin dry. Let the area air dry. °· Apply petroleum jelly or an antibiotic cream or ointment to keep the tear moist. This will help the wound heal. Do not allow a scab to form. °· If the dressing sticks before the next dressing change, moisten it with warm soapy water and gently remove it. °· Protect the injured skin until it has healed. °· Only take over-the-counter or prescription medicines as directed by your caregiver. °· Take showers or baths using warm soapy water. Apply a new dressing after the shower or bath. °· Keep all follow-up appointments as directed by your caregiver.   °SEEK IMMEDIATE MEDICAL CARE IF:  °· You have redness, swelling, or increasing pain in the skin tear. °· You have pus coming from the skin tear. °· You have chills. °· You have a red streak that goes away from the skin tear. °· You have a bad smell coming from the tear or dressing. °· You have a fever or persistent symptoms for more than 2-3 days. °· You  have a fever and your symptoms suddenly get worse. °MAKE SURE YOU: °· Understand these instructions. °· Will watch this condition. °· Will get help right away if your child is not doing well or gets worse. °Document Released: 07/07/2001 Document Revised: 07/06/2012 Document Reviewed: 04/25/2012 °ExitCare® Patient Information ©2015 [employer]. This information is not intended to replace advice given to you by your health care provider. Make sure you discuss any questions you have with your health care provider. ° °Fall Prevention and Home Safety °Falls cause injuries and can affect all age groups. It is possible to use preventive measures to significantly decrease the likelihood of falls. There are many simple measures which can make your home safer and prevent falls. °OUTDOORS °· Repair cracks and edges of walkways and driveways. °· Remove high doorway thresholds. °· Trim shrubbery on the main path into your home. °· Have good outside lighting. °· Clear walkways of tools, rocks, debris, and clutter. °· Check that handrails are not broken and are securely fastened. Both sides of steps should have handrails. °· Have leaves, snow, and ice cleared regularly. °· Use sand or salt on walkways during winter months. °· In the garage, clean up grease or oil spills. °BATHROOM °· Install night lights. °· Install grab bars by the toilet and in the tub and shower. °· Use non-skid mats or decals in the tub or shower. °· Place a plastic   non-slip stool in the shower to sit on, if needed. °· Keep floors dry and clean up all water on the floor immediately. °· Remove soap buildup in the tub or shower on a regular basis. °· Secure bath mats with non-slip, double-sided rug tape. °· Remove throw rugs and tripping hazards from the floors. °BEDROOMS °· Install night lights. °· Make sure a bedside light is easy to reach. °· Do not use oversized bedding. °· Keep a telephone by your bedside. °· Have a firm chair with side arms to use for  getting dressed. °· Remove throw rugs and tripping hazards from the floor. °KITCHEN °· Keep handles on pots and pans turned toward the center of the stove. Use back burners when possible. °· Clean up spills quickly and allow time for drying. °· Avoid walking on wet floors. °· Avoid hot utensils and knives. °· Position shelves so they are not too high or low. °· Place commonly used objects within easy reach. °· If necessary, use a sturdy step stool with a grab bar when reaching. °· Keep electrical cables out of the way. °· Do not use floor polish or wax that makes floors slippery. If you must use wax, use non-skid floor wax. °· Remove throw rugs and tripping hazards from the floor. °STAIRWAYS °· Never leave objects on stairs. °· Place handrails on both sides of stairways and use them. Fix any loose handrails. Make sure handrails on both sides of the stairways are as long as the stairs. °· Check carpeting to make sure it is firmly attached along stairs. Make repairs to worn or loose carpet promptly. °· Avoid placing throw rugs at the top or bottom of stairways, or properly secure the rug with carpet tape to prevent slippage. Get rid of throw rugs, if possible. °· Have an electrician put in a light switch at the top and bottom of the stairs. °OTHER FALL PREVENTION TIPS °· Wear low-heel or rubber-soled shoes that are supportive and fit well. Wear closed toe shoes. °· When using a stepladder, make sure it is fully opened and both spreaders are firmly locked. Do not climb a closed stepladder. °· Add color or contrast paint or tape to grab bars and handrails in your home. Place contrasting color strips on first and last steps. °· Learn and use mobility aids as needed. Install an electrical emergency response system. °· Turn on lights to avoid dark areas. Replace light bulbs that burn out immediately. Get light switches that glow. °· Arrange furniture to create clear pathways. Keep furniture in the same place. °· Firmly  attach carpet with non-skid or double-sided tape. °· Eliminate uneven floor surfaces. °· Select a carpet pattern that does not visually hide the edge of steps. °· Be aware of all pets. °OTHER HOME SAFETY TIPS °· Set the water temperature for 120° F (48.8° C). °· Keep emergency numbers on or near the telephone. °· Keep smoke detectors on every level of the home and near sleeping areas. °Document Released: 10/02/2002 Document Revised: 04/12/2012 Document Reviewed: 01/01/2012 °ExitCare® Patient Information ©2015 [employer]. This information is not intended to replace advice given to you by your health care provider. Make sure you discuss any questions you have with your health care provider. ° °

## 2014-07-03 ENCOUNTER — Telehealth: Payer: Self-pay | Admitting: *Deleted

## 2014-07-03 NOTE — Telephone Encounter (Signed)
Received call from Amy from [facility] requesting verbal order for PT for twice week for 8 weeks to get pt back to transfer and ambulation, I went and asked if ok to do therapy to Dr. Jeanice Lim nurse and states it is ok. I called Amy back and gave verbal order.

## 2014-07-25 ENCOUNTER — Inpatient Hospital Stay (HOSPITAL_COMMUNITY)
Admission: EM | Admit: 2014-07-25 | Discharge: 2014-07-31 | DRG: 291 | Disposition: A | Discharge status: Skilled Nursing Facility | Payer: Medicare Other | Attending: Internal Medicine | Admitting: Internal Medicine

## 2014-07-25 ENCOUNTER — Encounter (HOSPITAL_COMMUNITY): Payer: Self-pay | Admitting: Emergency Medicine

## 2014-07-25 ENCOUNTER — Emergency Department (HOSPITAL_COMMUNITY): Payer: Medicare Other

## 2014-07-25 DIAGNOSIS — K589 Irritable bowel syndrome without diarrhea: Secondary | ICD-10-CM | POA: Diagnosis present

## 2014-07-25 DIAGNOSIS — I10 Essential (primary) hypertension: Secondary | ICD-10-CM | POA: Diagnosis present

## 2014-07-25 DIAGNOSIS — Z66 Do not resuscitate: Secondary | ICD-10-CM | POA: Diagnosis present

## 2014-07-25 DIAGNOSIS — H9190 Unspecified hearing loss, unspecified ear: Secondary | ICD-10-CM | POA: Diagnosis present

## 2014-07-25 DIAGNOSIS — Z8249 Family history of ischemic heart disease and other diseases of the circulatory system: Secondary | ICD-10-CM | POA: Diagnosis not present

## 2014-07-25 DIAGNOSIS — I5033 Acute on chronic diastolic (congestive) heart failure: Secondary | ICD-10-CM | POA: Diagnosis present

## 2014-07-25 DIAGNOSIS — J449 Chronic obstructive pulmonary disease, unspecified: Secondary | ICD-10-CM

## 2014-07-25 DIAGNOSIS — J4489 Other specified chronic obstructive pulmonary disease: Secondary | ICD-10-CM

## 2014-07-25 DIAGNOSIS — I4891 Unspecified atrial fibrillation: Secondary | ICD-10-CM | POA: Diagnosis present

## 2014-07-25 DIAGNOSIS — E43 Unspecified severe protein-calorie malnutrition: Secondary | ICD-10-CM | POA: Diagnosis present

## 2014-07-25 DIAGNOSIS — I509 Heart failure, unspecified: Secondary | ICD-10-CM

## 2014-07-25 DIAGNOSIS — F419 Anxiety disorder, unspecified: Secondary | ICD-10-CM | POA: Diagnosis present

## 2014-07-25 DIAGNOSIS — D649 Anemia, unspecified: Secondary | ICD-10-CM | POA: Diagnosis present

## 2014-07-25 DIAGNOSIS — R7989 Other specified abnormal findings of blood chemistry: Secondary | ICD-10-CM

## 2014-07-25 DIAGNOSIS — R799 Abnormal finding of blood chemistry, unspecified: Secondary | ICD-10-CM

## 2014-07-25 DIAGNOSIS — Z9981 Dependence on supplemental oxygen: Secondary | ICD-10-CM | POA: Diagnosis not present

## 2014-07-25 DIAGNOSIS — Z823 Family history of stroke: Secondary | ICD-10-CM

## 2014-07-25 DIAGNOSIS — Z7982 Long term (current) use of aspirin: Secondary | ICD-10-CM | POA: Diagnosis not present

## 2014-07-25 DIAGNOSIS — I2510 Atherosclerotic heart disease of native coronary artery without angina pectoris: Secondary | ICD-10-CM | POA: Diagnosis present

## 2014-07-25 DIAGNOSIS — R0902 Hypoxemia: Secondary | ICD-10-CM | POA: Diagnosis present

## 2014-07-25 DIAGNOSIS — Z79899 Other long term (current) drug therapy: Secondary | ICD-10-CM

## 2014-07-25 DIAGNOSIS — E039 Hypothyroidism, unspecified: Secondary | ICD-10-CM | POA: Diagnosis present

## 2014-07-25 DIAGNOSIS — I5030 Unspecified diastolic (congestive) heart failure: Secondary | ICD-10-CM | POA: Diagnosis present

## 2014-07-25 DIAGNOSIS — G629 Polyneuropathy, unspecified: Secondary | ICD-10-CM

## 2014-07-25 DIAGNOSIS — S91002A Unspecified open wound, left ankle, initial encounter: Secondary | ICD-10-CM

## 2014-07-25 DIAGNOSIS — R0989 Other specified symptoms and signs involving the circulatory and respiratory systems: Secondary | ICD-10-CM

## 2014-07-25 DIAGNOSIS — I5032 Chronic diastolic (congestive) heart failure: Secondary | ICD-10-CM

## 2014-07-25 DIAGNOSIS — M810 Age-related osteoporosis without current pathological fracture: Secondary | ICD-10-CM | POA: Diagnosis present

## 2014-07-25 DIAGNOSIS — T148XXA Other injury of unspecified body region, initial encounter: Secondary | ICD-10-CM

## 2014-07-25 DIAGNOSIS — R0609 Other forms of dyspnea: Secondary | ICD-10-CM

## 2014-07-25 DIAGNOSIS — I5031 Acute diastolic (congestive) heart failure: Secondary | ICD-10-CM

## 2014-07-25 DIAGNOSIS — Z6821 Body mass index (BMI) 21.0-21.9, adult: Secondary | ICD-10-CM

## 2014-07-25 DIAGNOSIS — IMO0001 Reserved for inherently not codable concepts without codable children: Secondary | ICD-10-CM

## 2014-07-25 DIAGNOSIS — R0600 Dyspnea, unspecified: Secondary | ICD-10-CM | POA: Diagnosis present

## 2014-07-25 LAB — CBC WITH DIFFERENTIAL/PLATELET
Basophils Absolute: 0 10*3/uL (ref 0.0–0.1)
Basophils Relative: 0 % (ref 0–1)
EOS PCT: 0 % (ref 0–5)
Eosinophils Absolute: 0 10*3/uL (ref 0.0–0.7)
HEMATOCRIT: 27.5 % — AB (ref 36.0–46.0)
Hemoglobin: 8.9 g/dL — ABNORMAL LOW (ref 12.0–15.0)
LYMPHS PCT: 5 % — AB (ref 12–46)
Lymphs Abs: 0.5 10*3/uL — ABNORMAL LOW (ref 0.7–4.0)
MCH: 27.2 pg (ref 26.0–34.0)
MCHC: 32.4 g/dL (ref 30.0–36.0)
MCV: 84.1 fL (ref 78.0–100.0)
MONO ABS: 0.6 10*3/uL (ref 0.1–1.0)
MONOS PCT: 6 % (ref 3–12)
Neutro Abs: 8.9 10*3/uL — ABNORMAL HIGH (ref 1.7–7.7)
Neutrophils Relative %: 89 % — ABNORMAL HIGH (ref 43–77)
Platelets: 385 10*3/uL (ref 150–400)
RBC: 3.27 MIL/uL — ABNORMAL LOW (ref 3.87–5.11)
RDW: 16.7 % — AB (ref 11.5–15.5)
WBC: 10 10*3/uL (ref 4.0–10.5)

## 2014-07-25 LAB — URINALYSIS, ROUTINE W REFLEX MICROSCOPIC
Bilirubin Urine: NEGATIVE
Glucose, UA: NEGATIVE mg/dL
Hgb urine dipstick: NEGATIVE
Ketones, ur: NEGATIVE mg/dL
LEUKOCYTES UA: NEGATIVE
Nitrite: NEGATIVE
PROTEIN: NEGATIVE mg/dL
Specific Gravity, Urine: 1.015 (ref 1.005–1.030)
Urobilinogen, UA: 0.2 mg/dL (ref 0.0–1.0)
pH: 5.5 (ref 5.0–8.0)

## 2014-07-25 LAB — COMPREHENSIVE METABOLIC PANEL
ALT: 17 U/L (ref 0–35)
AST: 21 U/L (ref 0–37)
Albumin: 2.4 g/dL — ABNORMAL LOW (ref 3.5–5.2)
Alkaline Phosphatase: 110 U/L (ref 39–117)
Anion gap: 14 (ref 5–15)
BUN: 19 mg/dL (ref 6–23)
CALCIUM: 8.5 mg/dL (ref 8.4–10.5)
CO2: 25 mEq/L (ref 19–32)
Chloride: 100 mEq/L (ref 96–112)
Creatinine, Ser: 1.32 mg/dL — ABNORMAL HIGH (ref 0.50–1.10)
GFR calc non Af Amer: 34 mL/min — ABNORMAL LOW (ref >90)
GFR, EST AFRICAN AMERICAN: 40 mL/min — AB (ref >90)
Glucose, Bld: 102 mg/dL — ABNORMAL HIGH (ref 70–99)
Potassium: 3.8 mEq/L (ref 3.7–5.3)
Sodium: 139 mEq/L (ref 137–147)
Total Bilirubin: 0.4 mg/dL (ref 0.3–1.2)
Total Protein: 7 g/dL (ref 6.0–8.3)

## 2014-07-25 LAB — PRO B NATRIURETIC PEPTIDE: PRO B NATRI PEPTIDE: 2481 pg/mL — AB (ref 0–450)

## 2014-07-25 LAB — MRSA PCR SCREENING: MRSA by PCR: POSITIVE — AB

## 2014-07-25 MED ORDER — MUPIROCIN 2 % EX OINT
1.0000 "application " | TOPICAL_OINTMENT | Freq: Two times a day (BID) | CUTANEOUS | Status: AC
  Administered 2014-07-25 – 2014-07-30 (×10): 1 via NASAL
  Filled 2014-07-25 (×3): qty 22

## 2014-07-25 MED ORDER — FUROSEMIDE 10 MG/ML IJ SOLN
40.0000 mg | Freq: Every day | INTRAMUSCULAR | Status: DC
  Administered 2014-07-25 – 2014-07-27 (×3): 40 mg via INTRAVENOUS
  Filled 2014-07-25 (×3): qty 4

## 2014-07-25 MED ORDER — SODIUM CHLORIDE 0.9 % IJ SOLN
3.0000 mL | Freq: Two times a day (BID) | INTRAMUSCULAR | Status: DC
  Administered 2014-07-25 – 2014-07-31 (×11): 3 mL via INTRAVENOUS

## 2014-07-25 MED ORDER — ONDANSETRON HCL 4 MG PO TABS: 4.0000 mg | ORAL_TABLET | Freq: Four times a day (QID) | ORAL | Status: DC | PRN

## 2014-07-25 MED ORDER — ACETAMINOPHEN 325 MG PO TABS
650.0000 mg | ORAL_TABLET | Freq: Four times a day (QID) | ORAL | Status: DC | PRN
  Administered 2014-07-28 – 2014-07-30 (×3): 650 mg via ORAL
  Filled 2014-07-25 (×3): qty 2

## 2014-07-25 MED ORDER — CHLORHEXIDINE GLUCONATE CLOTH 2 % EX PADS
6.0000 | MEDICATED_PAD | Freq: Every day | CUTANEOUS | Status: AC
  Administered 2014-07-26 – 2014-07-30 (×5): 6 via TOPICAL

## 2014-07-25 MED ORDER — ACETAMINOPHEN 650 MG RE SUPP: 650.0000 mg | Freq: Four times a day (QID) | RECTAL | Status: DC | PRN

## 2014-07-25 MED ORDER — CETYLPYRIDINIUM CHLORIDE 0.05 % MT LIQD
7.0000 mL | Freq: Two times a day (BID) | OROMUCOSAL | Status: DC
  Administered 2014-07-25 – 2014-07-31 (×12): 7 mL via OROMUCOSAL

## 2014-07-25 MED ORDER — ONDANSETRON HCL 4 MG/2ML IJ SOLN
4.0000 mg | Freq: Four times a day (QID) | INTRAMUSCULAR | Status: DC | PRN
  Administered 2014-07-27: 4 mg via INTRAVENOUS
  Filled 2014-07-25: qty 2

## 2014-07-25 MED ORDER — HEPARIN SODIUM (PORCINE) 5000 UNIT/ML IJ SOLN
5000.0000 [IU] | Freq: Three times a day (TID) | INTRAMUSCULAR | Status: DC
  Administered 2014-07-25 – 2014-07-31 (×18): 5000 [IU] via SUBCUTANEOUS
  Filled 2014-07-25 (×18): qty 1

## 2014-07-25 MED ORDER — FUROSEMIDE 10 MG/ML IJ SOLN
40.0000 mg | Freq: Once | INTRAMUSCULAR | Status: AC
  Administered 2014-07-25: 40 mg via INTRAVENOUS
  Filled 2014-07-25: qty 4

## 2014-07-25 NOTE — Progress Notes (Signed)
Lab called to notify of a positive PCR screen on this patient. Pt placed on isolation-contact precautions and MD notified.

## 2014-07-25 NOTE — H&P (Signed)
[facility] History and Physical  [name]:[id number] DOB: September 15, 1920 DOA: 07/25/2014  Referring physician: Emergency Department PCP: Milinda Antis, MD  Specialists:   Chief Complaint: SOB  HPI: [name] is a 80 y.o. female  With a hx of diastolic chf, htn, hypothyroid, CAD who presents to ED with complaints of SOB. In the ED, the patient was noted to be hypoxic with o2 sats in the 80's. She was found to have an elevated BNP of 2481 and cxr with B interstitial and alveolar airspace opacities suggestive of pulm edema vs pneumonitis. The patient was given one dose of IV lasix and the hospitalist service consulted for admission. On further questioning, pt reports increased SOB laying down and improved when sitting up.  Review of Systems:  Per above, the remainder of the 10pt ros reviewed and are neg  Past Medical History  Diagnosis Date  . Hypertension   . Thyroid disease   . Depression   . Cataract   . Osteoporosis   . IBS (irritable bowel syndrome)   . Anemia   . Hearing loss   . Bilateral leg edema   . Stasis ulcer of lower extremity     followed by wound care  . Peripheral neuropathy   . Critical lower limb ischemia   . Arthritis    Past Surgical History  Procedure Laterality Date  . Back surgery x2    . Neck surgery    . Hip surgery    . Eye surgery      CATARACT REMOVAL  . Abdominal hysterectomy     Social History:  reports that she has never smoked. She has never used smokeless tobacco. She reports that she does not drink alcohol or use illicit drugs.  where does patient live--home, ALF, SNF? and with whom if at home?  Can patient participate in ADLs?  Allergies  Allergen Reactions  . Acetaminophen-Codeine     REACTION: causes nausea  . Sulfa Antibiotics Other (See Comments)    Makes her 'sick'    Family History  Problem Relation Age of Onset  . Hypertension Mother   . Hypertension Father   . Stroke Father   . Heart disease  Brother   . Heart disease Brother     (be sure to complete)  Prior to Admission medications   Medication Sig Start Date End Date Taking? Authorizing Provider  albuterol (PROVENTIL HFA;VENTOLIN HFA) 108 (90 BASE) MCG/ACT inhaler Inhale 2 puffs into the lungs every 8 (eight) hours as needed for wheezing or shortness of breath.   Yes Historical Provider, MD  amiodarone (PACERONE) 200 MG tablet Take 1 tablet (200 mg total) by mouth daily. 05/19/14  Yes Maryann Mikhail, DO  amLODipine (NORVASC) 5 MG tablet Take 1 tablet (5 mg total) by mouth daily. 09/27/13  Yes Salley Scarlet, MD  aspirin EC 81 MG tablet Take 81 mg by mouth every morning.    Yes Historical Provider, MD  Calcium-Vitamin D (CALTRATE 600 PLUS-VIT D PO) Take 1 tablet by mouth 2 (two) times daily.     Yes Historical Provider, MD  cloNIDine (CATAPRES) 0.1 MG tablet Take 1 tablet (0.1 mg total) by mouth at bedtime. 04/24/14  Yes Salley Scarlet, MD  ferrous sulfate 325 (65 FE) MG tablet Take 1 tablet (325 mg total) by mouth 2 (two) times daily with a meal. 04/30/14  Yes Maryann Mikhail, DO  furosemide (LASIX) 20 MG tablet Take 20 mg by mouth 2 (two) times daily.  Yes Historical Provider, MD  gabapentin (NEURONTIN) 300 MG capsule TAKE (1) CAPSULE BY MOUTH IN THE MORNING AND (2) CAPSULES AT BEDTIME. 02/15/14  Yes Salley ScarletKawanta F [place], MD  levothyroxine (SYNTHROID, LEVOTHROID) 50 MCG tablet TAKE ONE TABLET DAILY. 01/09/14  Yes Salley ScarletKawanta F [place], MD  Multiple Vitamins-Minerals (SENIOR MULTIVITAMIN PLUS) TABS Take 1 tablet by mouth every morning.    Yes Historical Provider, MD  Polyethyl Glycol-Propyl Glycol (SYSTANE OP) Apply 2 drops to eye daily.    Yes Historical Provider, MD  polyethylene glycol (MIRALAX / GLYCOLAX) packet Take 17 g by mouth daily as needed for mild constipation.   Yes Historical Provider, MD  rOPINIRole (REQUIP) 0.5 MG tablet Take 0.5 mg by mouth at bedtime.   Yes Historical Provider, MD  traMADol (ULTRAM) 50 MG tablet Take one  tablet by mouth twice daily as needed for pain 06/25/14  Yes Tiffany L Reed, DO  venlafaxine (EFFEXOR) 37.5 MG tablet TAKE ONE TABLET DAILY. 04/23/14  Yes Salley ScarletKawanta F [place], MD   Physical Exam: Filed Vitals:   07/25/14 1100 07/25/14 1148 07/25/14 1200 07/25/14 1309  BP: 135/47 122/57 130/63 146/53  Pulse: 74 75 70 74  Temp:      TempSrc:      Resp:  18 18 17   Height:      Weight:      SpO2: 89% 90% 88% 86%     General:  Awake, in nad  Eyes: PERRL B  ENT: membranes moist, dentition fair  Neck: trachea midline, neck supple  Cardiovascular: regular, s1, s2  Respiratory: normal resp effort, no wheezing  Abdomen: soft, nondistended  Skin: normal skin turgor, multiple areas of skin breakdown in B LE with multiple patches of ecchymosis throughout  Musculoskeletal: perfused, no clubbing  Psychiatric: mood/affect normal // no auditory/visual hallucinations  Neurologic: cn2-12 grossly intact, strength/sensation intact  Labs on Admission:  Basic Metabolic Panel:  Recent Labs Lab 07/25/14 1129  NA 139  K 3.8  CL 100  CO2 25  GLUCOSE 102*  BUN 19  CREATININE 1.32*  CALCIUM 8.5   Liver Function Tests:  Recent Labs Lab 07/25/14 1129  AST 21  ALT 17  ALKPHOS 110  BILITOT 0.4  PROT 7.0  ALBUMIN 2.4*   No results found for this basename: LIPASE, AMYLASE,  in the last 168 hours No results found for this basename: AMMONIA,  in the last 168 hours CBC:  Recent Labs Lab 07/25/14 1129  WBC 10.0  NEUTROABS 8.9*  HGB 8.9*  HCT 27.5*  MCV 84.1  PLT 385   Cardiac Enzymes: No results found for this basename: CKTOTAL, CKMB, CKMBINDEX, TROPONINI,  in the last 168 hours  BNP (last 3 results)  Recent Labs  07/25/14 1129  PROBNP 2481.0*   CBG: No results found for this basename: GLUCAP,  in the last 168 hours  Radiological Exams on Admission: Dg Chest Port 1 View  07/25/2014   CLINICAL DATA:  Acute on chronic shortness of breath  EXAM: PORTABLE CHEST - 1 VIEW   COMPARISON:  06/20/2014  FINDINGS: Small left pleural effusion. Bilateral interstitial and alveolar airspace opacities and enlargement of the central pulmonary vasculature. No pneumothorax. Stable cardiomegaly. Thoracic aortic atherosclerosis. Unremarkable osseous structures.  IMPRESSION: Small left pleural effusion and bilateral interstitial and alveolar airspace opacities. Differential diagnosis includes pulmonary edema versus pneumonitis.   Electronically Signed   By: Elige KoHetal  Patel   On: 07/25/2014 12:18   EKG: Independently reviewed. NSR  Assessment/Plan Principal Problem:   Diastolic  CHF Active Problems:   HYPOTHYROIDISM   ANEMIA, NORMOCYTIC, CHRONIC   HYPERTENSION   CORONARY ARTERY DISEASE   Atrial fibrillation   Dyspnea  1. Hypoxia likely secondary to acutely decompensated diastolic CHF 1. Cont pt on scheduled lasix as tolerated 2. Follow daily weights 3. Monitor strict i/o 4. Follow renal function closely 5. Last 2d echo in 04/2014 revealing normal EF but grade 2 diastolic dysfunction. Will repeat study 2. Hypothyroid 1. Resume thyroid meds 2. Check TSH 3. Chronic anemia 1. Follow CBC  2. Baseline hgb seems to be just under 10 4. HTN 1. BP stable 2. Cont home meds for now 5. CAD 1. Stable 2. Asymptomatic 6. Hx Afib 1. Rate controlled 2. Cont meds 7. DVT prophylaxis 1. Heparin subQ 8. Skin breakdown 1. Will consult wound care  Code Status: DNR, confirmed with patient Family Communication: Pt in room  Disposition Plan: Pending  Time spent:  Karmel Patricelli, Scheryl Marten [facility] Pager [phone]  If 7PM-7AM, please contact night-coverage [email or web address] Password [facility] 07/25/2014, 2:34 PM

## 2014-07-25 NOTE — ED Provider Notes (Signed)
CSN: [id number]     Arrival date & time 07/25/14  1024 History   This chart was scribed for Gerhard Munch, MD by Jolene Provost, ED Scribe. This patient was seen in room APA05/APA05 and the patient's care was started at 10:40 AM.    Chief Complaint  Patient presents with  . Shortness of Breath   The history is provided by the patient. No language interpreter was used.   HPI Comments: [name] is a 80 y.o. female who presents to the Emergency Department complaining of an acute execration of chronic SOB. Pt states that she has these symptoms most mornings. Pt states that she reports to ED today because she had difficulty sleeping because of her symptoms. Pt reports fever, but does not remember the temp (pt's temperature at triage is 99.4). Pt denies weight loss/gain, appetite change, vomiting, chills, or nausea. She denies hx of COPD, lung disease, or heart disease.  Pt is not on oxygen at home and states the she is not supposed be. Pt is currently on 4L of o2 with an oxygen saturation of 86%. Nurse states that she is 70% on room air.    Past Medical History  Diagnosis Date  . Hypertension   . Thyroid disease   . Depression   . Cataract   . Osteoporosis   . IBS (irritable bowel syndrome)   . Anemia   . Hearing loss   . Bilateral leg edema   . Stasis ulcer of lower extremity     followed by wound care  . Peripheral neuropathy   . Critical lower limb ischemia   . Arthritis    Past Surgical History  Procedure Laterality Date  . Back surgery x2    . Neck surgery    . Hip surgery    . Eye surgery      CATARACT REMOVAL  . Abdominal hysterectomy     Family History  Problem Relation Age of Onset  . Hypertension Mother   . Hypertension Father   . Stroke Father   . Heart disease Brother   . Heart disease Brother    History  Substance Use Topics  . Smoking status: Never Smoker   . Smokeless tobacco: Never Used  . Alcohol Use: No     Comment: occasional   OB History    Grav Para Term Preterm Abortions TAB SAB Ect Mult Living                 Review of Systems  Constitutional:       Per HPI, otherwise negative  HENT:       Per HPI, otherwise negative  Respiratory:       Per HPI, otherwise negative  Cardiovascular:       Per HPI, otherwise negative  Endocrine:       Negative aside from HPI  Genitourinary:       Neg aside from HPI   Musculoskeletal:       Per HPI, otherwise negative  Skin: Negative.       Allergies  Acetaminophen-codeine and Sulfa antibiotics  Home Medications   Prior to Admission medications   Medication Sig Start Date End Date Taking? Authorizing Provider  albuterol (PROVENTIL HFA;VENTOLIN HFA) 108 (90 BASE) MCG/ACT inhaler Inhale 2 puffs into the lungs every 8 (eight) hours as needed for wheezing or shortness of breath.   Yes Historical Provider, MD  amiodarone (PACERONE) 200 MG tablet Take 1 tablet (200 mg total) by mouth daily.  05/19/14  Yes Maryann Mikhail, DO  amLODipine (NORVASC) 5 MG tablet Take 1 tablet (5 mg total) by mouth daily. 09/27/13  Yes Salley Scarlet, MD  aspirin EC 81 MG tablet Take 81 mg by mouth every morning.    Yes Historical Provider, MD  Calcium-Vitamin D (CALTRATE 600 PLUS-VIT D PO) Take 1 tablet by mouth 2 (two) times daily.     Yes Historical Provider, MD  cloNIDine (CATAPRES) 0.1 MG tablet Take 1 tablet (0.1 mg total) by mouth at bedtime. 04/24/14  Yes Salley Scarlet, MD  ferrous sulfate 325 (65 FE) MG tablet Take 1 tablet (325 mg total) by mouth 2 (two) times daily with a meal. 04/30/14  Yes Maryann Mikhail, DO  furosemide (LASIX) 20 MG tablet Take 20 mg by mouth 2 (two) times daily.   Yes Historical Provider, MD  gabapentin (NEURONTIN) 300 MG capsule TAKE (1) CAPSULE BY MOUTH IN THE MORNING AND (2) CAPSULES AT BEDTIME. 02/15/14  Yes Salley Scarlet, MD  levothyroxine (SYNTHROID, LEVOTHROID) 50 MCG tablet TAKE ONE TABLET DAILY. 01/09/14  Yes Salley Scarlet, MD  Multiple Vitamins-Minerals  (SENIOR MULTIVITAMIN PLUS) TABS Take 1 tablet by mouth every morning.    Yes Historical Provider, MD  Polyethyl Glycol-Propyl Glycol (SYSTANE OP) Apply 2 drops to eye daily.    Yes Historical Provider, MD  polyethylene glycol (MIRALAX / GLYCOLAX) packet Take 17 g by mouth daily as needed for mild constipation.   Yes Historical Provider, MD  rOPINIRole (REQUIP) 0.5 MG tablet Take 0.5 mg by mouth at bedtime.   Yes Historical Provider, MD  traMADol (ULTRAM) 50 MG tablet Take one tablet by mouth twice daily as needed for pain 06/25/14  Yes Tiffany L Reed, DO  venlafaxine (EFFEXOR) 37.5 MG tablet TAKE ONE TABLET DAILY. 04/23/14  Yes Salley Scarlet, MD   BP 146/53  Pulse 74  Temp(Src) 99.4 F (37.4 C) (Oral)  Resp 17  Ht 4\' 9"  (1.448 m)  Wt 104 lb (47.174 kg)  BMI 22.50 kg/m2  SpO2 86% Physical Exam  Nursing note and vitals reviewed. Constitutional: She is oriented to person, place, and time. She appears well-developed and well-nourished. No distress.   Good peripheral pulses.  HENT:  Head: Normocephalic and atraumatic.  Eyes: Conjunctivae and EOM are normal.  Cardiovascular: Normal rate and regular rhythm.   Pulmonary/Chest: No stridor. No respiratory distress.  Course lung sounds on right.  Abdominal: She exhibits no distension.  Musculoskeletal: She exhibits no edema.  Neurological: She is alert and oriented to person, place, and time. No cranial nerve deficit.  Skin: Skin is warm and dry.  Psychiatric: She has a normal mood and affect.    ED Course  Procedures (including critical care time) DIAGNOSTIC STUDIES: Oxygen Saturation is 86% on on [place], low by my interpretation.    COORDINATION OF CARE: 10:45 AM Will order CXR, Labs, EKG. Pt agreed to treatment plan.  Labs Review Labs Reviewed  CBC WITH DIFFERENTIAL - Abnormal; Notable for the following:    RBC 3.27 (*)    Hemoglobin 8.9 (*)    HCT 27.5 (*)    RDW 16.7 (*)    Neutrophils Relative % 89 (*)    Neutro Abs 8.9 (*)     Lymphocytes Relative 5 (*)    Lymphs Abs 0.5 (*)    All other components within normal limits  COMPREHENSIVE METABOLIC PANEL - Abnormal; Notable for the following:    Glucose, Bld 102 (*)    Creatinine,  Ser 1.32 (*)    Albumin 2.4 (*)    GFR calc non Af Amer 34 (*)    GFR calc Af Amer 40 (*)    All other components within normal limits  PRO B NATRIURETIC PEPTIDE - Abnormal; Notable for the following:    Pro B Natriuretic peptide (BNP) 2481.0 (*)    All other components within normal limits  URINALYSIS, ROUTINE W REFLEX MICROSCOPIC    Imaging Review Dg Chest Port 1 View  07/25/2014   CLINICAL DATA:  Acute on chronic shortness of breath  EXAM: PORTABLE CHEST - 1 VIEW  COMPARISON:  06/20/2014  FINDINGS: Small left pleural effusion. Bilateral interstitial and alveolar airspace opacities and enlargement of the central pulmonary vasculature. No pneumothorax. Stable cardiomegaly. Thoracic aortic atherosclerosis. Unremarkable osseous structures.  IMPRESSION: Small left pleural effusion and bilateral interstitial and alveolar airspace opacities. Differential diagnosis includes pulmonary edema versus pneumonitis.   Electronically Signed   By: Elige KoHetal  Patel   On: 07/25/2014 12:18     EKG Interpretation   Date/Time:  Wednesday July 25 2014 11:27:33 EDT Ventricular Rate:  73 PR Interval:  167 QRS Duration: 94 QT Interval:  430 QTC Calculation: 474 R Axis:   3 Text Interpretation:  Sinus rhythm Low voltage, extremity and precordial  leads Consider anterior infarct Sinus rhythm Artifact Non-specific  intra-ventricular conduction delay T wave abnormality Abnormal ekg  Confirmed by Gerhard MunchLOCKWOOD, Neshia Mckenzie  MD [id number]) on 07/25/2014 1:05:56 PM      MDM   Final diagnoses:  Dyspnea  Elevated brain natriuretic peptide (BNP) level    I personally performed the services described in this documentation, which was scribed in my presence. The recorded information has been reviewed and is  accurate.   Patient presents with dyspnea and was found to be hypoxic on room air, saturations 70%.  Patient corrects with nasal cannula to 90%. Patient is in no distress, no cough, fever, the patient's x-ray suggests possible opacification, this is more consistent with pulmonary congestion secondary to heart failure exacerbation. With elevated BNP, patient received additional Lasix, was admitted for further evaluation and management.   Gerhard Munchobert Dearia [place], MD 07/25/14 1401

## 2014-07-25 NOTE — ED Notes (Signed)
Ems called out with pt sob.  ra sats 70%.  Pt c/o being  achy

## 2014-07-25 NOTE — ED Notes (Signed)
Admitting MD in room at this time 

## 2014-07-26 DIAGNOSIS — I359 Nonrheumatic aortic valve disorder, unspecified: Secondary | ICD-10-CM

## 2014-07-26 DIAGNOSIS — I10 Essential (primary) hypertension: Secondary | ICD-10-CM

## 2014-07-26 DIAGNOSIS — I4891 Unspecified atrial fibrillation: Secondary | ICD-10-CM

## 2014-07-26 DIAGNOSIS — R0600 Dyspnea, unspecified: Secondary | ICD-10-CM

## 2014-07-26 LAB — CBC
HCT: 29.3 % — ABNORMAL LOW (ref 36.0–46.0)
HEMOGLOBIN: 9.3 g/dL — AB (ref 12.0–15.0)
MCH: 26.6 pg (ref 26.0–34.0)
MCHC: 31.7 g/dL (ref 30.0–36.0)
MCV: 83.7 fL (ref 78.0–100.0)
Platelets: 444 10*3/uL — ABNORMAL HIGH (ref 150–400)
RBC: 3.5 MIL/uL — AB (ref 3.87–5.11)
RDW: 16.8 % — ABNORMAL HIGH (ref 11.5–15.5)
WBC: 11 10*3/uL — ABNORMAL HIGH (ref 4.0–10.5)

## 2014-07-26 LAB — COMPREHENSIVE METABOLIC PANEL
ALBUMIN: 2.3 g/dL — AB (ref 3.5–5.2)
ALT: 17 U/L (ref 0–35)
AST: 23 U/L (ref 0–37)
Alkaline Phosphatase: 120 U/L — ABNORMAL HIGH (ref 39–117)
Anion gap: 13 (ref 5–15)
BUN: 16 mg/dL (ref 6–23)
CO2: 29 mEq/L (ref 19–32)
Calcium: 8.3 mg/dL — ABNORMAL LOW (ref 8.4–10.5)
Chloride: 98 mEq/L (ref 96–112)
Creatinine, Ser: 1.22 mg/dL — ABNORMAL HIGH (ref 0.50–1.10)
GFR calc Af Amer: 44 mL/min — ABNORMAL LOW (ref >90)
GFR calc non Af Amer: 38 mL/min — ABNORMAL LOW (ref >90)
Glucose, Bld: 96 mg/dL (ref 70–99)
Potassium: 3.1 mEq/L — ABNORMAL LOW (ref 3.7–5.3)
SODIUM: 140 meq/L (ref 137–147)
TOTAL PROTEIN: 7.2 g/dL (ref 6.0–8.3)
Total Bilirubin: 0.5 mg/dL (ref 0.3–1.2)

## 2014-07-26 MED ORDER — COLLAGENASE 250 UNIT/GM EX OINT
TOPICAL_OINTMENT | Freq: Every day | CUTANEOUS | Status: DC
  Administered 2014-07-26: 14:00:00 via TOPICAL
  Administered 2014-07-27: 1 via TOPICAL
  Administered 2014-07-28 – 2014-07-31 (×4): via TOPICAL
  Filled 2014-07-26 (×3): qty 30

## 2014-07-26 MED ORDER — LEVOTHYROXINE SODIUM 50 MCG PO TABS
50.0000 ug | ORAL_TABLET | Freq: Every day | ORAL | Status: DC
  Administered 2014-07-26 – 2014-07-31 (×6): 50 ug via ORAL
  Filled 2014-07-26 (×6): qty 1

## 2014-07-26 MED ORDER — FERROUS SULFATE 325 (65 FE) MG PO TABS
325.0000 mg | ORAL_TABLET | Freq: Two times a day (BID) | ORAL | Status: DC
  Administered 2014-07-26 – 2014-07-31 (×10): 325 mg via ORAL
  Filled 2014-07-26 (×10): qty 1

## 2014-07-26 MED ORDER — CLONIDINE HCL 0.1 MG PO TABS
0.1000 mg | ORAL_TABLET | Freq: Every day | ORAL | Status: DC
  Administered 2014-07-26 – 2014-07-30 (×5): 0.1 mg via ORAL
  Filled 2014-07-26 (×5): qty 1

## 2014-07-26 MED ORDER — VENLAFAXINE HCL 37.5 MG PO TABS
37.5000 mg | ORAL_TABLET | Freq: Every day | ORAL | Status: DC
  Administered 2014-07-26 – 2014-07-31 (×6): 37.5 mg via ORAL
  Filled 2014-07-26 (×7): qty 1

## 2014-07-26 MED ORDER — AMLODIPINE BESYLATE 5 MG PO TABS
5.0000 mg | ORAL_TABLET | Freq: Every day | ORAL | Status: DC
Start: 2014-07-26 — End: 2014-07-31
  Administered 2014-07-26 – 2014-07-31 (×6): 5 mg via ORAL
  Filled 2014-07-26 (×7): qty 1

## 2014-07-26 MED ORDER — AMIODARONE HCL 200 MG PO TABS
200.0000 mg | ORAL_TABLET | Freq: Every day | ORAL | Status: DC
  Administered 2014-07-26 – 2014-07-31 (×6): 200 mg via ORAL
  Filled 2014-07-26 (×7): qty 1

## 2014-07-26 MED ORDER — ROPINIROLE HCL 0.25 MG PO TABS
0.5000 mg | ORAL_TABLET | Freq: Every day | ORAL | Status: DC
  Administered 2014-07-26 – 2014-07-30 (×5): 0.5 mg via ORAL
  Filled 2014-07-26: qty 2
  Filled 2014-07-26: qty 1
  Filled 2014-07-26 (×4): qty 2

## 2014-07-26 MED ORDER — ENSURE COMPLETE PO LIQD
237.0000 mL | Freq: Two times a day (BID) | ORAL | Status: DC
  Administered 2014-07-26 – 2014-07-31 (×8): 237 mL via ORAL

## 2014-07-26 MED ORDER — POTASSIUM CHLORIDE CRYS ER 20 MEQ PO TBCR
40.0000 meq | EXTENDED_RELEASE_TABLET | Freq: Two times a day (BID) | ORAL | Status: AC
  Administered 2014-07-26 (×2): 40 meq via ORAL
  Filled 2014-07-26 (×3): qty 2

## 2014-07-26 MED ORDER — ASPIRIN EC 81 MG PO TBEC
81.0000 mg | DELAYED_RELEASE_TABLET | ORAL | Status: DC
  Administered 2014-07-26 – 2014-07-31 (×6): 81 mg via ORAL
  Filled 2014-07-26 (×6): qty 1

## 2014-07-26 NOTE — Progress Notes (Signed)
  Echocardiogram 2D Echocardiogram has been performed.  Rylei Codispoti FRANCES 07/26/2014, 3:32 PM

## 2014-07-26 NOTE — Progress Notes (Signed)
I began the interview process initiating a PT eval but was interrupted by echocardiogram tech who needed to perform this procedure.  We will plan to see her in the AM.

## 2014-07-26 NOTE — Clinical Social Work Psychosocial (Signed)
Clinical Social Work Department BRIEF PSYCHOSOCIAL ASSESSMENT 07/26/2014  Patient:  [name], [name]     Account Number:  [id number]     Admit date:  07/25/2014  Clinical Social Worker:  Wyatt Haste  Date/Time:  07/26/2014 01:22 PM  Referred by:  CSW  Date Referred:  07/26/2014 Referred for  ALF Placement   Other Referral:   Interview type:  Patient Other interview type:    PSYCHOSOCIAL DATA Living Status:  FACILITY Admitted from facility:  [place] Level of care:  Assisted Living Primary support name:  Shanon Brow Primary support relationship to patient:  CHILD, ADULT Degree of support available:   supportive, but lives out of town    CURRENT CONCERNS Current Concerns  Post-Acute Placement   Other Concerns:    [place] / PLAN CSW met with pt at bedside. Pt alert and oriented and reports she has been a resident at [facility] for about one month. She initially was at [facility] for rehab and then transferred to ALF. Pt reports she has two sons, one who lives in [place] and the other in [place]. She indicates she talks to them regularly on the phone, but does not see them often. Pt's primary local support are friends. She said if able, she would like to be able to return home and has people looking after her home for her. Per Linus Orn, administrator at facility, pt is an extensive assist, requiring 2 person assist for transfers. Pt primarily uses a wheelchair. Linus Orn reports pt is doing well there, but has chronic wounds. Their main concern is that pt has very thin skin and easily gets skin tears or bruises with just completing daily activities. She sees a wound doctor regularly and has in house home health PT and RN. As long as there are no significant changes to pt's care, it is okay to return to [facility]. Pt reports she came to ED due to SOB. Admitted with CHF. Pt is eager to d/c from hospital as soon as possible. She requests to return to [place]  at d/c. CSW asked about calling sons and pt said she has already spoken to them and there is no need to call.   Assessment/plan status:  Psychosocial Support/Ongoing Assessment of Needs Other assessment/ plan:   Information/referral to community resources:   Robley Fries    PATIENT'S/FAMILY'S RESPONSE TO PLAN OF CARE: Pt reports relatively positive feelings regarding return to [facility] when medically stable. CSW will continue to follow.       Benay Pike, [place]

## 2014-07-26 NOTE — Progress Notes (Signed)
INITIAL NUTRITION ASSESSMENT  DOCUMENTATION CODES Per approved criteria  -Not Applicable   INTERVENTION: Ensure Complete po BID, each supplement provides 350 kcal and 13 grams of protein  NUTRITION DIAGNOSIS: Inadequate oral intake related to decreased appetite as evidenced by PO: 25%.   Goal: Pt will meet >90% of estimated nutritional needs  Monitor:  PO/supplement intake, labs, weight changes, I/O's  Reason for Assessment: MST=2, low braden   80 y.o. female  Admitting Dx: Diastolic CHF  DREONNA HUSSEIN is a 80 y.o. female with a hx of diastolic chf, htn, hypothyroid, CAD who presents to ED with complaints of SOB. In the ED, the patient was noted to be hypoxic with o2 sats in the 80's. She was found to have an elevated BNP of 2481 and cxr with B interstitial and alveolar airspace opacities suggestive of pulm edema vs pneumonitis. The patient was given one dose of IV lasix and the hospitalist service consulted for admission. On further questioning, pt reports increased SOB laying down and improved when sitting up.  ASSESSMENT: Pt confirms wt loss and poor appetite, but unable to give extensive hx. Reports this has been going on for a while. Noted very minimal intake of lunch tray, less than 25% of completion.  She denies any troubles chewing and swallowing and confirms that it is easier to drink than to eat at this time.  Spoke with pt nurse, who reports that pt has had difficulty feeding herself. Nurse was assisting with feeding at time of visit.  Documented hx reveals an 8.3% wt loss over the past 3 months. Pt also with chronic stasis ulcers on her leg. Nutrition focused physical exam deferred due to multiple bandages on legs and pt performing self care at time of visit. Educated pt on importance of good PO intake to support healing process. Discussed importance of good protein intake to support wound healing. Also discussed importance of consuming supplements in light of poor  appetite- pt agreeable to chocolate flavored Ensure.   Labs reviewed. K: 3.1 (on supplement), Creat: 1.22.   Height: Ht Readings from Last 1 Encounters:  07/25/14 4\' 9"  (1.448 m)    Weight: Wt Readings from Last 1 Encounters:  07/26/14 99 lb 3.3 oz (45 kg)    Ideal Body Weight: 95#  % Ideal Body Weight: 104%  Wt Readings from Last 50 Encounters:  07/26/14 99 lb 3.3 oz (45 kg)  07/01/14 104 lb (47.174 kg)  05/30/14 104 lb 9.6 oz (47.446 kg)  04/27/14 108 lb 0.4 oz (49 kg)  04/17/14 103 lb (46.72 kg)  04/16/14 104 lb (47.174 kg)  02/26/14 103 lb (46.72 kg)  01/30/14 106 lb (48.081 kg)  10/12/13 106 lb 3.2 oz (48.172 kg)  09/27/13 106 lb (48.081 kg)  09/14/13 106 lb 6.4 oz (48.263 kg)  05/30/13 114 lb (51.71 kg)  02/14/13 114 lb 1.3 oz (51.746 kg)  12/20/12 114 lb (51.71 kg)  11/14/12 117 lb (53.071 kg)  04/27/12 102 lb (46.267 kg)  04/26/12 102 lb (46.267 kg)  03/14/12 102 lb (46.267 kg)  12/02/11 98 lb (44.453 kg)  11/27/11 103 lb 0.6 oz (46.739 kg)  08/31/11 97 lb (43.999 kg)  06/16/11 93 lb 0.6 oz (42.203 kg)  05/26/11 91 lb 0.6 oz (41.295 kg)  05/19/11 89 lb (40.37 kg)  06/25/09 97 lb (43.999 kg)  04/23/09 92 lb 12 oz (42.071 kg)  03/26/09 95 lb 4 oz (43.205 kg)  12/04/08 94 lb (42.638 kg)  10/30/08 93 lb 4 oz (  42.298 kg)  07/31/08 92 lb (41.731 kg)  06/21/08 90 lb (40.824 kg)  05/21/08 90 lb (40.824 kg)  05/01/08 95 lb (43.092 kg)  04/19/08 93 lb (42.185 kg)  04/03/08 95 lb (43.092 kg)  12/13/07 97 lb 8 oz (44.226 kg)  09/05/07 94 lb (42.638 kg)  07/18/07 95 lb 8 oz (43.319 kg)  06/06/07 98 lb (44.453 kg)  09/10/06 107 lb (48.535 kg)   Usual Body Weight: 106%  % Usual Body Weight: 93%  BMI:  Body mass index is 21.46 kg/(m^2). Normal weight change  Estimated Nutritional Needs: Kcal: 1350-1575 Protein: 59-69 grams Fluid: 1.4-1.6 L  Skin: skin tear left upper arm, open wound on left toe, open wound rt ankle, open wound on rt ankle, left ankle skin  tear, open wound on rt knee  Diet Order: Cardiac  EDUCATION NEEDS: -Education needs addressed   Intake/Output Summary (Last 24 hours) at 07/26/14 0859 Last data filed at 07/26/14 0631  Gross per 24 hour  Intake    243 ml  Output   2075 ml  Net  -1832 ml    Last BM: 07/24/14  Labs:   Recent Labs Lab 07/25/14 1129 07/26/14 0612  NA 139 140  K 3.8 3.1*  CL 100 98  CO2 25 29  BUN 19 16  CREATININE 1.32* 1.22*  CALCIUM 8.5 8.3*  GLUCOSE 102* 96    CBG (last 3)  No results found for this basename: GLUCAP,  in the last 72 hours  Scheduled Meds: . antiseptic oral rinse  7 mL Mouth Rinse BID  . Chlorhexidine Gluconate Cloth  6 each Topical Q0600  . furosemide  40 mg Intravenous Daily  . heparin  5,000 Units Subcutaneous 3 times per day  . mupirocin ointment  1 application Nasal BID  . potassium chloride  40 mEq Oral BID  . sodium chloride  3 mL Intravenous Q12H    Continuous Infusions:   Past Medical History  Diagnosis Date  . Hypertension   . Thyroid disease   . Depression   . Cataract   . Osteoporosis   . IBS (irritable bowel syndrome)   . Anemia   . Hearing loss   . Bilateral leg edema   . Stasis ulcer of lower extremity     followed by wound care  . Peripheral neuropathy   . Critical lower limb ischemia   . Arthritis     Past Surgical History  Procedure Laterality Date  . Back surgery x2    . Neck surgery    . Hip surgery    . Eye surgery      CATARACT REMOVAL  . Abdominal hysterectomy      Hermes [name] A. Mayford KnifeWilliams, RD, LDN Pager: [phone]

## 2014-07-26 NOTE — Care Management Utilization Note (Signed)
UR completed 

## 2014-07-26 NOTE — Progress Notes (Signed)
[facility] PROGRESS NOTE  [name] QMV:[id number] DOB: 03/05/1920 DOA: 07/25/2014 PCP: Milinda AntisURHAM, KAWANTA, MD  Assessment/Plan: 1. Hypoxia likely secondary to acutely decompensated diastolic CHF  1. Would cont scheduled IV lasix as tolerated 2. Net neg 1.8L this AM 3. Follow daily weights - 45kg today 4. Follow renal function closely 5. Last 2d echo in 04/2014 revealing normal EF but grade 2 diastolic dysfunction. Will repeat study 2. Hypothyroid  1. Resume thyroid meds 2. Check TSH 3. Chronic anemia  1. Follow CBC  2. Baseline hgb seems to be just under 10 4. HTN  1. BP stable 2. Cont home meds for now 5. CAD  1. Stable 2. Asymptomatic 6. Hx Afib  1. Remains rate controlled 2. Cont home meds 7. DVT prophylaxis  1. Heparin subQ 8. Skin breakdown  1. Consulted wound care  Code Status: DNR Family Communication: Pt in room Disposition Plan: Pending  Consultants:  None  Procedures:    Antibiotics:    HPI/Subjective: Feels better today. Denies SOB.   Objective: Filed Vitals:   07/25/14 1653 07/25/14 2153 07/26/14 0500 07/26/14 0708  BP:  135/42  149/58  Pulse:  70  73  Temp:  98.8 F (37.1 C)  98.9 F (37.2 C)  TempSrc:  Oral  Oral  Resp:  18  18  Height: 4\' 9"  (1.448 m)     Weight: 46.5 kg (102 lb 8.2 oz)  45 kg (99 lb 3.3 oz)   SpO2:  92%  93%    Intake/Output Summary (Last 24 hours) at 07/26/14 0954 Last data filed at 07/26/14 0631  Gross per 24 hour  Intake    243 ml  Output   2075 ml  Net  -1832 ml   Filed Weights   07/25/14 1033 07/25/14 1653 07/26/14 0500  Weight: 47.174 kg (104 lb) 46.5 kg (102 lb 8.2 oz) 45 kg (99 lb 3.3 oz)    Exam:   General:  Awake, in nad  Cardiovascular: regular, s1, s2  Respiratory: normal resp effort, no wheezing  Abdomen: soft, nondistended  Musculoskeletal: perfused, no clubbing   Data Reviewed: Basic Metabolic Panel:  Recent Labs Lab 07/25/14 1129 07/26/14 0612  NA 139 140  K 3.8  3.1*  CL 100 98  CO2 25 29  GLUCOSE 102* 96  BUN 19 16  CREATININE 1.32* 1.22*  CALCIUM 8.5 8.3*   Liver Function Tests:  Recent Labs Lab 07/25/14 1129 07/26/14 0612  AST 21 23  ALT 17 17  ALKPHOS 110 120*  BILITOT 0.4 0.5  PROT 7.0 7.2  ALBUMIN 2.4* 2.3*   No results found for this basename: LIPASE, AMYLASE,  in the last 168 hours No results found for this basename: AMMONIA,  in the last 168 hours CBC:  Recent Labs Lab 07/25/14 1129 07/26/14 0612  WBC 10.0 11.0*  NEUTROABS 8.9*  --   HGB 8.9* 9.3*  HCT 27.5* 29.3*  MCV 84.1 83.7  PLT 385 444*   Cardiac Enzymes: No results found for this basename: CKTOTAL, CKMB, CKMBINDEX, TROPONINI,  in the last 168 hours BNP (last 3 results)  Recent Labs  07/25/14 1129  PROBNP 2481.0*   CBG: No results found for this basename: GLUCAP,  in the last 168 hours  Recent Results (from the past 240 hour(s))  MRSA PCR SCREENING     Status: Abnormal   Collection Time    07/25/14  6:45 PM      Result Value Ref Range Status   MRSA  by PCR POSITIVE (*) NEGATIVE Final   Comment:            The GeneXpert MRSA Assay (FDA     approved for NASAL specimens     only), is one component of a     comprehensive MRSA colonization     surveillance program. It is not     intended to diagnose MRSA     infection nor to guide or     monitor treatment for     MRSA infections.     CRITICAL RESULT CALLED TO, READ BACK BY AND VERIFIED WITH:     SEIGLA,J AT 9:51PM ON 07/25/2014 BY ISLEY,B     Studies: Dg Chest Port 1 View  07/25/2014   CLINICAL DATA:  Acute on chronic shortness of breath  EXAM: PORTABLE CHEST - 1 VIEW  COMPARISON:  06/20/2014  FINDINGS: Small left pleural effusion. Bilateral interstitial and alveolar airspace opacities and enlargement of the central pulmonary vasculature. No pneumothorax. Stable cardiomegaly. Thoracic aortic atherosclerosis. Unremarkable osseous structures.  IMPRESSION: Small left pleural effusion and bilateral  interstitial and alveolar airspace opacities. Differential diagnosis includes pulmonary edema versus pneumonitis.   Electronically Signed   By: Elige Ko   On: 07/25/2014 12:18    Scheduled Meds: . antiseptic oral rinse  7 mL Mouth Rinse BID  . Chlorhexidine Gluconate Cloth  6 each Topical Q0600  . collagenase   Topical Daily  . furosemide  40 mg Intravenous Daily  . heparin  5,000 Units Subcutaneous 3 times per day  . mupirocin ointment  1 application Nasal BID  . potassium chloride  40 mEq Oral BID  . sodium chloride  3 mL Intravenous Q12H   Continuous Infusions:   Principal Problem:   Diastolic CHF Active Problems:   HYPOTHYROIDISM   ANEMIA, NORMOCYTIC, CHRONIC   HYPERTENSION   CORONARY ARTERY DISEASE   Atrial fibrillation   Dyspnea   Acute diastolic CHF (congestive heart failure)  Time spent:  Kemarion Abbey K  [facility] Pager [phone]. If 7PM-7AM, please contact night-coverage at [email or web address], password [facility] 07/26/2014, 9:54 AM  LOS: 1 day

## 2014-07-26 NOTE — Consult Note (Signed)
WOC wound consult note Reason for Consult: CAD, multiple areas of breakdown on bilateral lower extremities. Full thickness Wound type: Vascular ulcers.  Measurement: Left lateral leg near malleolus 3 cm x 3 cm unable to visualize wound bed due to the presence of eschar.  Erythema surrounding periwound, patient has spasms and involuntary movements of legs.  Right dorsal leg with exposed tendon 10 cm x 3 cm unable to visualize wound bed due to the presence of eschar/  Erythema surrounding periwound. Wound bed: Bilateral scattered open areas, covered with eschar.  Drainage (amount, consistency, odor) Minimal serosanguinous, foul odor.  Periwound: Erythema- Due to foul odor and erythema, will begin enzymatic debridement.  Dressing procedure/placement/frequency: Cleanse bilateral legs with NS and pat gently dry.  Apply Santyl ointment, 1/8 inch thickness (opaque) to open/necrotic areas.  Top with vaseline gauze.  Secure with kerlix and tape.  Will not follow at this time.  Please re-consult if needed.  Maple HudsonKaren Shaterrica Territo RN BSN CWON Pager [phone]

## 2014-07-27 DIAGNOSIS — I5032 Chronic diastolic (congestive) heart failure: Secondary | ICD-10-CM

## 2014-07-27 DIAGNOSIS — R799 Abnormal finding of blood chemistry, unspecified: Secondary | ICD-10-CM

## 2014-07-27 DIAGNOSIS — I5031 Acute diastolic (congestive) heart failure: Secondary | ICD-10-CM

## 2014-07-27 DIAGNOSIS — T148 Other injury of unspecified body region: Secondary | ICD-10-CM

## 2014-07-27 LAB — BASIC METABOLIC PANEL
Anion gap: 12 (ref 5–15)
BUN: 16 mg/dL (ref 6–23)
CO2: 27 mEq/L (ref 19–32)
Calcium: 8.7 mg/dL (ref 8.4–10.5)
Chloride: 101 mEq/L (ref 96–112)
Creatinine, Ser: 1.07 mg/dL (ref 0.50–1.10)
GFR calc non Af Amer: 44 mL/min — ABNORMAL LOW (ref >90)
GFR, EST AFRICAN AMERICAN: 51 mL/min — AB (ref >90)
GLUCOSE: 133 mg/dL — AB (ref 70–99)
POTASSIUM: 4.5 meq/L (ref 3.7–5.3)
SODIUM: 140 meq/L (ref 137–147)

## 2014-07-27 LAB — TSH: TSH: 3.05 u[IU]/mL (ref 0.350–4.500)

## 2014-07-27 MED ORDER — FUROSEMIDE 20 MG PO TABS
20.0000 mg | ORAL_TABLET | Freq: Two times a day (BID) | ORAL | Status: DC
  Administered 2014-07-27: 20 mg via ORAL
  Filled 2014-07-27: qty 1

## 2014-07-27 NOTE — Evaluation (Signed)
Physical Therapy Evaluation Patient Details Name: [name] MRN: [id number] DOB: 10/07/1920 Today's Date: 07/27/2014   History of Present Illness  With a hx of diastolic chf, htn, hypothyroid, CAD who presents to ED with complaints of SOB. In the ED, the patient was noted to be hypoxic with o2 sats in the 80's. She was found to have an elevated BNP of 2481 and cxr with B interstitial and alveolar airspace opacities suggestive of pulm edema vs pneumonitis. The patient was given one dose of IV lasix and the hospitalist service consulted for admission. On further questioning, pt reports increased SOB laying down and improved when sitting up.  Clinical Impression  Pt is a resident of [facility] who was seen for a PT eval.  She  Has had a fairly rapid decline in her functional mobility over the past few months.  She is no longer ambulatory and has needed full assist for transfers.  She has multiple skin tears in the LEs from any external contact.  Her legs are wrapped in Kerlix.  Pt is extremely deconditioned and appears to have significant neurologic impairment of both LEs with severe flexor spasticity bilaterally.  She requires a total lift from bed to chair.  Because of her progressive decline and severity of immobility I do not think that she has much rehab potential.  She may need SNF for management of her medical problems.    Follow Up Recommendations No PT follow up    Equipment Recommendations  None recommended by PT    Recommendations for Other Services   none    Precautions / Restrictions Precautions Precautions: Fall Precaution Comments: very fragile skin Restrictions Weight Bearing Restrictions: No      Mobility  Bed Mobility Overal bed mobility: Needs Assistance;+2 for physical assistance Bed Mobility: Supine to Sit     Supine to sit: +2 for physical assistance     General bed mobility comments: once at edge of bed pt was very fearful of falling but she was able to sit  independently with close guarding  Transfers Overall transfer level: Needs assistance   Transfers: Squat Pivot Transfers     Squat pivot transfers: Total assist     General transfer comment: pt was fearful during transfer and during the transfer began to pull backward against the therapist...once in chair she was able to sit back but LEs pulled up into hyperflexion  Ambulation/Gait                Stairs            Wheelchair Mobility    Modified Rankin (Stroke Patients Only)       Balance Overall balance assessment: Needs assistance Sitting-balance support: Bilateral upper extremity supported Sitting balance-Leahy Scale: Fair       Standing balance-Leahy Scale: Zero                               Pertinent Vitals/Pain Pain Assessment: No/denies pain    Home Living Family/patient expects to be discharged to:: Skilled nursing facility                      Prior Function Level of Independence: Needs assistance   Gait / Transfers Assistance Needed: pt was not ambulatory and needed a 2 person assist for transfers.  ADL's / Homemaking Assistance Needed: pt reprots that she was able to dress herself but needed help with bathing  Hand Dominance        Extremity/Trunk Assessment               Lower Extremity Assessment: Generalized weakness;RLE deficits/detail;LLE deficits/detail RLE Deficits / Details: severe flexor spasticity...unable to bring LEs into extension LLE Deficits / Details: severe flexor spasticity...unable to bring LEs into extension  Cervical / Trunk Assessment: Kyphotic  Communication   Communication: No difficulties  Cognition Arousal/Alertness: Awake/alert Behavior During Therapy: WFL for tasks assessed/performed Overall Cognitive Status: Within Functional Limits for tasks assessed                      General Comments      Exercises        Assessment/Plan    PT Assessment  Patent does not need any further PT services  PT Diagnosis     PT Problem List    PT Treatment Interventions     PT Goals (Current goals can be found in the Care Plan section) Acute Rehab PT Goals PT Goal Formulation: No goals set, d/c therapy    Frequency     Barriers to discharge        Co-evaluation PT/OT/SLP Co-Evaluation/Treatment: Yes Reason for Co-Treatment: Complexity of the patient's impairments (multi-system involvement) PT goals addressed during session: Mobility/safety with mobility         End of Session Equipment Utilized During Treatment: Gait belt Activity Tolerance: Patient tolerated treatment well Patient left: in chair;with call bell/phone within reach;with chair alarm set;with nursing/sitter in room;with family/visitor present Nurse Communication: Mobility status;Need for lift equipment         Time: 1132-1230 PT Time Calculation (min): 58 min   Charges:         PT G Codes:          [name] 07/27/2014, 1:45 PM

## 2014-07-27 NOTE — Care Management Note (Signed)
    Page 1 of 1   07/31/2014     2:41:58 PM CARE MANAGEMENT NOTE 07/31/2014  Patient:  [name],[name]   Account Number:  [id number]  Date Initiated:  07/26/2014  Documentation initiated by:  Tammy HendersonBOLDEN,Tammy Janis  Subjective/Objective Assessment:   admitted with CHF, and wounds/lesions on her legs needing WOC attention. pt is from [facility], and would like to return  there at D/C.     Action/Plan:   CSW aware of pt visit   Anticipated DC Date:  07/31/2014   Anticipated DC Plan:  SKILLED NURSING FACILITY  In-house referral  Clinical Social Worker      DC Planning Services  CM consult      Choice offered to / List presented to:             Status of service:  Completed, signed off Medicare Important Message given?  NA - LOS <3 / Initial given by admissions (If response is "NO", the following Medicare IM given date fields will be blank) Date Medicare IM given:   Medicare IM given by:  Aleksander Edmiston Date Additional Medicare IM given:  07/31/2014 Additional Medicare IM given by:  [name]  [facility]  [facility]  Per UR Regulation:  Reviewed for med. necessity/level of care/duration of stay  If discussed at Long Length of Stay Meetings, dates discussed:   07/31/2014    Comments:  07/31/14 1400 Tammy HendersonGeneva Kala Gassmann RN/CM Pt being D/C to SNF today 07/27/14 1645 Antione Obar RN/ CM pt unable to participate in PT/OT. Will need a higher level of care due to this and to wounds. CSW notified 07/26/14 1545 Tammy HendersonGeneva Mariona Scholes RN/CM

## 2014-07-27 NOTE — Progress Notes (Signed)
Foley catheter removed per MD order. Patient tolerated removal well. Patient is expected to void by 2345 tonight. Will continue to monitor patient at this time.

## 2014-07-27 NOTE — Progress Notes (Signed)
Dressing to bilateral legs done at this time as ordered. Multiple wound noted to bilateral legs foul smelling with yellow exudate and necrotic areas noted to both legs. Purple skin noted around wounds. Patient tolerated dressing change well.

## 2014-07-27 NOTE — Evaluation (Signed)
Occupational Therapy Evaluation Patient Details Name: [name] MRN: [id number] DOB: February 06, 1920 Today's Date: 07/27/2014    History of Present Illness With a hx of diastolic chf, htn, hypothyroid, CAD who presents to ED with complaints of SOB. In the ED, the patient was noted to be hypoxic with o2 sats in the 80's. She was found to have an elevated BNP of 2481 and cxr with B interstitial and alveolar airspace opacities suggestive of pulm edema vs pneumonitis. The patient was given one dose of IV lasix and the hospitalist service consulted for admission. On further questioning, pt reports increased SOB laying down and improved when sitting up.   Clinical Impression   Pt is presenting to acute OT with above situation.  She expressed desire to transfer during evaluation, but was very fearful of falling.  Pt is total assist to t/f bed to chair.  She has generalized weakness in BUE and flexion contractures in BLE.  She will benefit from skilled care for medical management (wounds and fragile skin), but at this time will not benefit from further skilled OT services.    Follow Up Recommendations  No OT follow up;SNF    Equipment Recommendations  None recommended by OT    Recommendations for Other Services       Precautions / Restrictions Precautions Precautions: Fall Precaution Comments: very fragile skin Restrictions Weight Bearing Restrictions: No      Mobility Bed Mobility Overal bed mobility: Needs Assistance;+2 for physical assistance Bed Mobility: Supine to Sit     Supine to sit: +2 for physical assistance     General bed mobility comments: once at edge of bed pt was very fearful of falling but she was able to sit independently with close guarding  Transfers Overall transfer level: Needs assistance   Transfers: Squat Pivot Transfers     Squat pivot transfers: Total assist;+2 physical assistance;+2 safety/equipment     General transfer comment: pt was fearful  during transfer and during the transfer began to pull backward against the therapist...once in chair she was able to sit back but LEs pulled up into hyperflexion    Balance Overall balance assessment: Needs assistance Sitting-balance support: Bilateral upper extremity supported Sitting balance-Leahy Scale: Fair       Standing balance-Leahy Scale: Zero                              ADL Overall ADL's : Needs assistance/impaired Eating/Feeding: Set up   Grooming: Set up                   Toilet Transfer: Total assistance                   Vision                     Perception     Praxis      Pertinent Vitals/Pain Pain Assessment: No/denies pain     Hand Dominance Left   Extremity/Trunk Assessment Upper Extremity Assessment Upper Extremity Assessment: Generalized weakness (Very fragile skin)   Lower Extremity Assessment Lower Extremity Assessment: Defer to PT evaluation RLE Deficits / Details: severe flexor spasticity...unable to bring LEs into extension LLE Deficits / Details: severe flexor spasticity...unable to bring LEs into extension   Cervical / Trunk Assessment Cervical / Trunk Assessment: Kyphotic   Communication Communication Communication: No difficulties   Cognition Arousal/Alertness: Awake/alert Behavior During Therapy: WFL for tasks assessed/performed  Overall Cognitive Status: Within Functional Limits for tasks assessed                     General Comments       Exercises       Shoulder Instructions      Home Living Family/patient expects to be discharged to:: Skilled nursing facility                                        Prior Functioning/Environment Level of Independence: Needs assistance  Gait / Transfers Assistance Needed: pt was not ambulatory and needed a 2 person assist for transfers. ADL's / Homemaking Assistance Needed: Pt reports mod assist needed with bathing and  dressing. Frequires assist with meal prepr, but can self feed        OT Diagnosis:     OT Problem List:     OT Treatment/Interventions:      OT Goals(Current goals can be found in the care plan section) Acute Rehab OT Goals Patient Stated Goal: no OT goals needed  OT Frequency:     Barriers to D/C:            Co-evaluation PT/OT/SLP Co-Evaluation/Treatment: Yes Reason for Co-Treatment: Complexity of the patient's impairments (multi-system involvement) PT goals addressed during session: Mobility/safety with mobility OT goals addressed during session: Strengthening/ROM;ADL's and self-care;Other (comment) (Safety in transfers)      End of Session Equipment Utilized During Treatment: Gait belt  Activity Tolerance: Patient tolerated treatment well Patient left: in chair;with family/visitor present;with call bell/phone within reach;with chair alarm set   Time: 1140-1230 OT Time Calculation (min): 50 min Charges:  OT General Charges $OT Visit: 1 Procedure OT Evaluation $Initial OT Evaluation Tier I: 1 Procedure OT Treatments $Therapeutic Activity: 23-37 mins G-Codes:     Marry Guan Nashayla Telleria, MS, OTR/L [facility] [phone] 07/27/2014, 2:00 PM

## 2014-07-27 NOTE — Progress Notes (Signed)
OT Cancellation Note  Patient Details Name: Tammy [name] MRN: [id number] DOB: 1920-06-08   Cancelled Treatment:    Reason Eval/Treat Not Completed: Fatigue/lethargy limiting ability to participate (Attempted evalution - pt falling asleep during introductions.  Will re-attempt OT eval at later time/date when pt is more awake.)  Marry GuanMarie Rawlings Amoria Mclees, MS, OTR/L [facility] [phone] 07/27/2014, 8:37 AM

## 2014-07-27 NOTE — Progress Notes (Signed)
[facility] PROGRESS NOTE  [name] RUE:[id number] DOB: Nov 13, 1919 DOA: 07/25/2014 PCP: Milinda Antis, MD  Assessment/Plan: 1. Hypoxia likely secondary to acutely decompensated diastolic CHF  1. Would cont scheduled IV lasix as tolerated 2. Net neg 3L this AM 3. Follow daily weights - 44.1kg today 4. Follow renal function closely - thus far improving 5. Last 2d echo in 04/2014 revealing normal EF but grade 2 diastolic dysfunction. Will repeat study 6. Pt's current weight is the lowest in the past several years. Will transition to home PO lasix 2. Hypothyroid  1. Resume thyroid meds 2. Check TSH 3. Chronic anemia  1. Follow CBC  2. Baseline hgb seems to be just under 10 4. HTN  1. BP stable 2. Cont home meds for now 5. CAD  1. Stable 2. Asymptomatic 6. Hx Afib  1. Remains rate controlled 2. Cont home meds 7. DVT prophylaxis  1. Heparin subQ 8. Skin breakdown  1. Consulted wound care  Code Status: DNR Family Communication: Pt in room Disposition Plan: Plan d/c to snf tomorrow hopefully  Consultants:  None  Procedures:    Antibiotics:    HPI/Subjective: Cont to feel better. Denies SOB.   Objective: Filed Vitals:   07/26/14 1245 07/26/14 2209 07/27/14 0524 07/27/14 1336  BP: 150/51 155/83 138/45 127/47  Pulse: 81 76 66 63  Temp: 98.8 F (37.1 C) 100.2 F (37.9 C) 98.3 F (36.8 C) 98.2 F (36.8 C)  TempSrc: Oral Oral Oral Oral  Resp: 18 20 18 19   Height:      Weight:   44.1 kg (97 lb 3.6 oz)   SpO2: 92% 92% 95% 98%    Intake/Output Summary (Last 24 hours) at 07/27/14 1438 Last data filed at 07/27/14 1233  Gross per 24 hour  Intake      3 ml  Output    950 ml  Net   -947 ml   Filed Weights   07/25/14 1653 07/26/14 0500 07/27/14 0524  Weight: 46.5 kg (102 lb 8.2 oz) 45 kg (99 lb 3.3 oz) 44.1 kg (97 lb 3.6 oz)    Exam:   General:  Awake, in nad  Cardiovascular: regular, s1, s2  Respiratory: normal resp effort, no  wheezing  Abdomen: soft, nondistended  Musculoskeletal: perfused, no clubbing   Data Reviewed: Basic Metabolic Panel:  Recent Labs Lab 07/25/14 1129 07/26/14 0612 07/27/14 0639  NA 139 140 140  [name] 3.8 3.1* 4.5  CL 100 98 101  CO2 25 29 27   GLUCOSE 102* 96 133*  BUN 19 16 16   CREATININE 1.32* 1.22* 1.07  CALCIUM 8.5 8.3* 8.7   Liver Function Tests:  Recent Labs Lab 07/25/14 1129 07/26/14 0612  AST 21 23  ALT 17 17  ALKPHOS 110 120*  BILITOT 0.4 0.5  PROT 7.0 7.2  ALBUMIN 2.4* 2.3*   No results found for this basename: LIPASE, AMYLASE,  in the last 168 hours No results found for this basename: AMMONIA,  in the last 168 hours CBC:  Recent Labs Lab 07/25/14 1129 07/26/14 0612  WBC 10.0 11.0*  NEUTROABS 8.9*  --   HGB 8.9* 9.3*  HCT 27.5* 29.3*  MCV 84.1 83.7  PLT 385 444*   Cardiac Enzymes: No results found for this basename: CKTOTAL, CKMB, CKMBINDEX, TROPONINI,  in the last 168 hours BNP (last 3 results)  Recent Labs  07/25/14 1129  PROBNP 2481.0*   CBG: No results found for this basename: GLUCAP,  in the last 168 hours  Recent Results (from the past 240 hour(s))  MRSA PCR SCREENING     Status: Abnormal   Collection Time    07/25/14  6:45 PM      Result Value Ref Range Status   MRSA by PCR POSITIVE (*) NEGATIVE Final   Comment:            The GeneXpert MRSA Assay (FDA     approved for NASAL specimens     only), is one component of a     comprehensive MRSA colonization     surveillance program. It is not     intended to diagnose MRSA     infection nor to guide or     monitor treatment for     MRSA infections.     CRITICAL RESULT CALLED TO, READ BACK BY AND VERIFIED WITH:     SEIGLA,J AT 9:51PM ON 07/25/2014 BY ISLEY,B     Studies: No results found.  Scheduled Meds: . amiodarone  200 mg Oral Daily  . amLODipine  5 mg Oral Daily  . antiseptic oral rinse  7 mL Mouth Rinse BID  . aspirin EC  81 mg Oral BH-q7a  . Chlorhexidine Gluconate  Cloth  6 each Topical Q0600  . cloNIDine  0.1 mg Oral QHS  . collagenase   Topical Daily  . feeding supplement (ENSURE COMPLETE)  237 mL Oral BID BM  . ferrous sulfate  325 mg Oral BID WC  . furosemide  40 mg Intravenous Daily  . heparin  5,000 Units Subcutaneous 3 times per day  . levothyroxine  50 mcg Oral QAC breakfast  . mupirocin ointment  1 application Nasal BID  . rOPINIRole  0.5 mg Oral QHS  . sodium chloride  3 mL Intravenous Q12H  . venlafaxine  37.5 mg Oral Daily   Continuous Infusions:   Principal Problem:   Diastolic CHF Active Problems:   HYPOTHYROIDISM   ANEMIA, NORMOCYTIC, CHRONIC   HYPERTENSION   CORONARY ARTERY DISEASE   Atrial fibrillation   Dyspnea   Acute diastolic CHF (congestive heart failure)  Time spent: 35min  [name]  [facility] Pager [phone]. If 7PM-7AM, please contact night-coverage at [email or web address], password [email or web address] 07/27/2014, 2:38 PM  LOS: 2 days

## 2014-07-28 DIAGNOSIS — G629 Polyneuropathy, unspecified: Secondary | ICD-10-CM

## 2014-07-28 DIAGNOSIS — S91002A Unspecified open wound, left ankle, initial encounter: Secondary | ICD-10-CM

## 2014-07-28 LAB — BASIC METABOLIC PANEL
Anion gap: 13 (ref 5–15)
BUN: 23 mg/dL (ref 6–23)
CO2: 29 mEq/L (ref 19–32)
Calcium: 8.7 mg/dL (ref 8.4–10.5)
Chloride: 98 mEq/L (ref 96–112)
Creatinine, Ser: 1.23 mg/dL — ABNORMAL HIGH (ref 0.50–1.10)
GFR, EST AFRICAN AMERICAN: 43 mL/min — AB (ref >90)
GFR, EST NON AFRICAN AMERICAN: 37 mL/min — AB (ref >90)
Glucose, Bld: 84 mg/dL (ref 70–99)
POTASSIUM: 4.2 meq/L (ref 3.7–5.3)
SODIUM: 140 meq/L (ref 137–147)

## 2014-07-28 MED ORDER — FUROSEMIDE 20 MG PO TABS
20.0000 mg | ORAL_TABLET | Freq: Every day | ORAL | Status: DC
Start: 2014-07-29 — End: 2014-07-28
  Filled 2014-07-28: qty 1

## 2014-07-28 MED ORDER — FUROSEMIDE 20 MG PO TABS
20.0000 mg | ORAL_TABLET | Freq: Every day | ORAL | Status: DC
  Administered 2014-07-28 – 2014-07-29 (×2): 20 mg via ORAL
  Filled 2014-07-28: qty 1

## 2014-07-28 NOTE — Progress Notes (Signed)
[facility] PROGRESS NOTE  [name]:[id number] DOB: 10/06/1920 DOA: 07/25/2014 PCP: Milinda Antis, MD  Assessment/Plan: 1. Hypoxia likely secondary to acutely decompensated diastolic CHF  1. Initially cont scheduled IV lasix 2. Transitioned to home PO lasix on 10/2 3. Net neg 3.5L this AM 4. Follow daily weights - 44.1kg->44.8 today 5. Follow renal function closely 6. 2d echo in 04/2014 revealing normal EF, restrictive diastolic function 2. Hypothyroid  1. Resume thyroid meds 2. TSH normal 3. Chronic anemia  1. Follow CBC  2. Baseline hgb seems to be just under 10 4. HTN  1. BP stable 2. Cont home meds for now 5. CAD  1. Stable 2. Asymptomatic 6. Hx Afib  1. Remains rate controlled 2. Cont home meds 7. DVT prophylaxis  1. Heparin subQ 8. Skin breakdown  1. Consulted wound care  Code Status: DNR Family Communication: Pt in room Disposition Plan: Plan d/c to snf hopefully soon  Consultants:  None  Procedures:    Antibiotics:    HPI/Subjective: No acute events noted overnight.   Objective: Filed Vitals:   07/27/14 1336 07/27/14 2120 07/27/14 2309 07/28/14 0600  BP: 127/47 157/49 119/49 137/46  Pulse: 63 68 58 62  Temp: 98.2 F (36.8 C) 98.8 F (37.1 C) 98.7 F (37.1 C) 98.7 F (37.1 C)  TempSrc: Oral Oral Oral Oral  Resp: 19 18  18   Height:      Weight:    44.8 kg (98 lb 12.3 oz)  SpO2: 98% 94% 96% 96%    Intake/Output Summary (Last 24 hours) at 07/28/14 0817 Last data filed at 07/27/14 1734  Gross per 24 hour  Intake    123 ml  Output    650 ml  Net   -527 ml   Filed Weights   07/26/14 0500 07/27/14 0524 07/28/14 0600  Weight: 45 kg (99 lb 3.3 oz) 44.1 kg (97 lb 3.6 oz) 44.8 kg (98 lb 12.3 oz)   Exam:   General:  Awake, in nad  Cardiovascular: regular, s1, s2  Respiratory: normal resp effort, no wheezing  Abdomen: soft, nondistended  Musculoskeletal: perfused, no clubbing   Data Reviewed: Basic Metabolic  Panel:  Recent Labs Lab 07/25/14 1129 07/26/14 0612 07/27/14 0639 07/28/14 0636  NA 139 140 140 140  K 3.8 3.1* 4.5 4.2  CL 100 98 101 98  CO2 25 29 27 29   GLUCOSE 102* 96 133* 84  BUN 19 16 16 23   CREATININE 1.32* 1.22* 1.07 1.23*  CALCIUM 8.5 8.3* 8.7 8.7   Liver Function Tests:  Recent Labs Lab 07/25/14 1129 07/26/14 0612  AST 21 23  ALT 17 17  ALKPHOS 110 120*  BILITOT 0.4 0.5  PROT 7.0 7.2  ALBUMIN 2.4* 2.3*   No results found for this basename: LIPASE, AMYLASE,  in the last 168 hours No results found for this basename: AMMONIA,  in the last 168 hours CBC:  Recent Labs Lab 07/25/14 1129 07/26/14 0612  WBC 10.0 11.0*  NEUTROABS 8.9*  --   HGB 8.9* 9.3*  HCT 27.5* 29.3*  MCV 84.1 83.7  PLT 385 444*   Cardiac Enzymes: No results found for this basename: CKTOTAL, CKMB, CKMBINDEX, TROPONINI,  in the last 168 hours BNP (last 3 results)  Recent Labs  07/25/14 1129  PROBNP 2481.0*   CBG: No results found for this basename: GLUCAP,  in the last 168 hours  Recent Results (from the past 240 hour(s))  MRSA PCR SCREENING  Status: Abnormal   Collection Time    07/25/14  6:45 PM      Result Value Ref Range Status   MRSA by PCR POSITIVE (*) NEGATIVE Final   Comment:            The GeneXpert MRSA Assay (FDA     approved for NASAL specimens     only), is one component of a     comprehensive MRSA colonization     surveillance program. It is not     intended to diagnose MRSA     infection nor to guide or     monitor treatment for     MRSA infections.     CRITICAL RESULT CALLED TO, READ BACK BY AND VERIFIED WITH:     SEIGLA,J AT 9:51PM ON 07/25/2014 BY ISLEY,B     Studies: No results found.  Scheduled Meds: . amiodarone  200 mg Oral Daily  . amLODipine  5 mg Oral Daily  . antiseptic oral rinse  7 mL Mouth Rinse BID  . aspirin EC  81 mg Oral BH-q7a  . Chlorhexidine Gluconate Cloth  6 each Topical Q0600  . cloNIDine  0.1 mg Oral QHS  .  collagenase   Topical Daily  . feeding supplement (ENSURE COMPLETE)  237 mL Oral BID BM  . ferrous sulfate  325 mg Oral BID WC  . furosemide  20 mg Oral BID  . heparin  5,000 Units Subcutaneous 3 times per day  . levothyroxine  50 mcg Oral QAC breakfast  . mupirocin ointment  1 application Nasal BID  . rOPINIRole  0.5 mg Oral QHS  . sodium chloride  3 mL Intravenous Q12H  . venlafaxine  37.5 mg Oral Daily   Continuous Infusions:   Principal Problem:   Diastolic CHF Active Problems:   HYPOTHYROIDISM   ANEMIA, NORMOCYTIC, CHRONIC   HYPERTENSION   CORONARY ARTERY DISEASE   Atrial fibrillation   Dyspnea   Acute diastolic CHF (congestive heart failure)  Time spent: 35min  CHIU, STEPHEN K  [facility] Pager [phone]. If 7PM-7AM, please contact night-coverage at [email or web address], password [facility] 07/28/2014, 8:17 AM  LOS: 3 days

## 2014-07-29 ENCOUNTER — Inpatient Hospital Stay (HOSPITAL_COMMUNITY): Payer: Medicare Other

## 2014-07-29 LAB — BASIC METABOLIC PANEL
ANION GAP: 11 (ref 5–15)
BUN: 23 mg/dL (ref 6–23)
CO2: 31 mEq/L (ref 19–32)
Calcium: 8.7 mg/dL (ref 8.4–10.5)
Chloride: 99 mEq/L (ref 96–112)
Creatinine, Ser: 1.22 mg/dL — ABNORMAL HIGH (ref 0.50–1.10)
GFR, EST AFRICAN AMERICAN: 44 mL/min — AB (ref >90)
GFR, EST NON AFRICAN AMERICAN: 38 mL/min — AB (ref >90)
Glucose, Bld: 92 mg/dL (ref 70–99)
POTASSIUM: 4 meq/L (ref 3.7–5.3)
SODIUM: 141 meq/L (ref 137–147)

## 2014-07-29 MED ORDER — FUROSEMIDE 40 MG PO TABS
40.0000 mg | ORAL_TABLET | Freq: Two times a day (BID) | ORAL | Status: DC
  Administered 2014-07-29 – 2014-07-31 (×4): 40 mg via ORAL
  Filled 2014-07-29 (×4): qty 1

## 2014-07-29 MED ORDER — FUROSEMIDE 20 MG PO TABS: 20.0000 mg | ORAL_TABLET | Freq: Two times a day (BID) | ORAL | Status: DC

## 2014-07-29 NOTE — Progress Notes (Signed)
Dressing soiled on bil legs. Removed dressings to bil legs, cleansed with NS, patted dry. Then Santyl placed on necrotic areas, Vaseline gauze placed, topped and wrapped with Kerlix gauze & taped. Multiple wounds on bil LE's in different stages of healing. Scattered necrotic areas. Foul odor to wounds. Dressing now clean, dry & intact. Pt tolerated dressing change well. Pt resting comfortable in bed at lowest position & call bell within reach. Will continue to monitor.

## 2014-07-29 NOTE — Progress Notes (Signed)
PT/OT recommend SNF level but patient cannot participate in PT due to leg contractures per Myrlene Brokerlaudia Brown, PT.  Patient does have significant wounds to her legs which could qualify her for SNF vs possible return to ALF with Home Health. CSW services will need to follow up with family and [facility]- ALF)- to determine if appropriate to return to facility vs SNF.  Per MD- patient is stable for placement.  [facility] but no [facility] staff is available to consider options.  Will ask CSW to folow up on Monday Morning.  Anticipate d/c tomorrow either back to ALF or to SNF level.  Lorri Frederickonna T. Jaci LazierCrowder, KentuckyLCSW  [phone] (coverage)

## 2014-07-29 NOTE — Clinical Social Work Placement (Signed)
     Clinical Social Work Department CLINICAL SOCIAL WORK PLACEMENT NOTE 07/29/2014  Patient:  [name],[name]  Account Number:  [id number] Admit date:  07/25/2014  Clinical Social Worker:  Lupita LeashNNA Phillippe Orlick, LCSW  Date/time:  07/29/2014 12:10 PM  Clinical Social Work is seeking post-discharge placement for this patient at the following level of care:   SKILLED NURSING   (*CSW will update this form in Epic as items are completed)     Patient/family provided with [name] of Clinical Social Works list of facilities offering this level of care within the geographic area requested by the patient (or if unable, by the patients family).    Patient/family informed of their freedom to choose among providers that offer the needed level of care, that participate in Medicare, Medicaid or managed care program needed by the patient, have an available bed and are willing to accept the patient.    Patient/family informed of [facility] ownership interest in [facility], as well as of the fact that they are under no obligation to receive care at this facility.  PASARR submitted to EDS on  PASARR number received on   FL2 transmitted to all facilities in geographic area requested by pt/family on   FL2 transmitted to all facilities within larger geographic area on   Patient informed that his/her managed care company has contracts with or will negotiate with  certain facilities, including the following:     Patient/family informed of bed offers received:   Patient chooses bed at  Physician recommends and patient chooses bed at    Patient to be transferred to  on   Patient to be transferred to facility by  Patient and family notified of transfer on  Name of family member notified:    The following physician request were entered in Epic: Physician Request  Please sign FL2.  Please prepare priority discharge summary and prescriptions.    Additional Comments:

## 2014-07-29 NOTE — Progress Notes (Signed)
[facility] PROGRESS NOTE  [name] ZOX:[id number] DOB: Jan 17, 1920 DOA: 07/25/2014 PCP: Tammy Antis, MD  Assessment/Plan: 1. Hypoxia likely secondary to acutely decompensated diastolic CHF  1. Initially cont scheduled IV lasix 2. Transitioned to home PO lasix on 10/2 3. Net neg 3.4L this AM 4. Follow daily weights - 44.1kg->44kg today 5. Follow renal function closely 6. 2d echo in 04/2014 revealing normal EF, restrictive diastolic function 7. Pt remains 3L o2 dependent. Will repeat cxr for interval change. Will increase lasix to 40mg  po bid 2. Hypothyroid  1. Resume thyroid meds 2. TSH normal 3. Chronic anemia  1. Follow CBC  2. Baseline hgb seems to be just under 10 4. HTN  1. BP stable 2. Cont home meds for now 5. CAD  1. Stable 2. Asymptomatic 6. Hx Afib  1. Remains rate controlled 2. Cont home meds 7. DVT prophylaxis  1. Heparin subQ 8. Skin breakdown  1. Consulted wound care. Recs noted.  Code Status: DNR Family Communication: Pt in room Disposition Plan: Plan d/c to snf hopefully soon  Consultants:  None  Procedures:    Antibiotics:    HPI/Subjective: No complaints. No acute events noted overnight   Objective: Filed Vitals:   07/28/14 0600 07/28/14 1523 07/28/14 2331 07/29/14 0642  BP: 137/46 134/56 124/47 129/48  Pulse: 62 58 57 56  Temp: 98.7 F (37.1 C) 98.5 F (36.9 C) 98.5 F (36.9 C) 97.7 F (36.5 C)  TempSrc: Oral Oral Oral Oral  Resp: 18 18 18 16   Height:      Weight: 44.8 kg (98 lb 12.3 oz)   44 kg (97 lb)  SpO2: 96% 98% 94% 98%   No intake or output data in the 24 hours ending 07/29/14 0954 Filed Weights   07/27/14 0524 07/28/14 0600 07/29/14 0642  Weight: 44.1 kg (97 lb 3.6 oz) 44.8 kg (98 lb 12.3 oz) 44 kg (97 lb)   Exam:   General:  Awake, in nad  Cardiovascular: regular, s1, s2  Respiratory: normal resp effort, no wheezing  Abdomen: soft, nondistended  Musculoskeletal: perfused, no clubbing    Data Reviewed: Basic Metabolic Panel:  Recent Labs Lab 07/25/14 1129 07/26/14 0612 07/27/14 0639 07/28/14 0636 07/29/14 0559  NA 139 140 140 140 141  K 3.8 3.1* 4.5 4.2 4.0  CL 100 98 101 98 99  CO2 25 29 27 29 31   GLUCOSE 102* 96 133* 84 92  BUN 19 16 16 23 23   CREATININE 1.32* 1.22* 1.07 1.23* 1.22*  CALCIUM 8.5 8.3* 8.7 8.7 8.7   Liver Function Tests:  Recent Labs Lab 07/25/14 1129 07/26/14 0612  AST 21 23  ALT 17 17  ALKPHOS 110 120*  BILITOT 0.4 0.5  PROT 7.0 7.2  ALBUMIN 2.4* 2.3*   No results found for this basename: LIPASE, AMYLASE,  in the last 168 hours No results found for this basename: AMMONIA,  in the last 168 hours CBC:  Recent Labs Lab 07/25/14 1129 07/26/14 0612  WBC 10.0 11.0*  NEUTROABS 8.9*  --   HGB 8.9* 9.3*  HCT 27.5* 29.3*  MCV 84.1 83.7  PLT 385 444*   Cardiac Enzymes: No results found for this basename: CKTOTAL, CKMB, CKMBINDEX, TROPONINI,  in the last 168 hours BNP (last 3 results)  Recent Labs  07/25/14 1129  PROBNP 2481.0*   CBG: No results found for this basename: GLUCAP,  in the last 168 hours  Recent Results (from the past 240 hour(s))  MRSA PCR SCREENING  Status: Abnormal   Collection Time    07/25/14  6:45 PM      Result Value Ref Range Status   MRSA by PCR POSITIVE (*) NEGATIVE Final   Comment:            The GeneXpert MRSA Assay (FDA     approved for NASAL specimens     only), is one component of a     comprehensive MRSA colonization     surveillance program. It is not     intended to diagnose MRSA     infection nor to guide or     monitor treatment for     MRSA infections.     CRITICAL RESULT CALLED TO, READ BACK BY AND VERIFIED WITH:     SEIGLA,J AT 9:51PM ON 07/25/2014 BY ISLEY,B     Studies: No results found.  Scheduled Meds: . amiodarone  200 mg Oral Daily  . amLODipine  5 mg Oral Daily  . antiseptic oral rinse  7 mL Mouth Rinse BID  . aspirin EC  81 mg Oral BH-q7a  . Chlorhexidine  Gluconate Cloth  6 each Topical Q0600  . cloNIDine  0.1 mg Oral QHS  . collagenase   Topical Daily  . feeding supplement (ENSURE COMPLETE)  237 mL Oral BID BM  . ferrous sulfate  325 mg Oral BID WC  . furosemide  20 mg Oral Daily  . heparin  5,000 Units Subcutaneous 3 times per day  . levothyroxine  50 mcg Oral QAC breakfast  . mupirocin ointment  1 application Nasal BID  . rOPINIRole  0.5 mg Oral QHS  . sodium chloride  3 mL Intravenous Q12H  . venlafaxine  37.5 mg Oral Daily   Continuous Infusions:   Principal Problem:   Diastolic CHF Active Problems:   HYPOTHYROIDISM   ANEMIA, NORMOCYTIC, CHRONIC   HYPERTENSION   CORONARY ARTERY DISEASE   Atrial fibrillation   Dyspnea   Acute diastolic CHF (congestive heart failure)  Time spent: 35min  [name] K  [facility] Pager [phone]. If 7PM-7AM, please contact night-coverage at [email or web address], password [email or web address] 07/29/2014, 9:54 AM  LOS: 4 days

## 2014-07-30 DIAGNOSIS — J449 Chronic obstructive pulmonary disease, unspecified: Secondary | ICD-10-CM

## 2014-07-30 LAB — BASIC METABOLIC PANEL
ANION GAP: 12 (ref 5–15)
BUN: 21 mg/dL (ref 6–23)
CHLORIDE: 100 meq/L (ref 96–112)
CO2: 30 mEq/L (ref 19–32)
Calcium: 8.9 mg/dL (ref 8.4–10.5)
Creatinine, Ser: 1.21 mg/dL — ABNORMAL HIGH (ref 0.50–1.10)
GFR, EST AFRICAN AMERICAN: 44 mL/min — AB (ref >90)
GFR, EST NON AFRICAN AMERICAN: 38 mL/min — AB (ref >90)
Glucose, Bld: 88 mg/dL (ref 70–99)
Potassium: 4 mEq/L (ref 3.7–5.3)
Sodium: 142 mEq/L (ref 137–147)

## 2014-07-30 NOTE — Progress Notes (Signed)
Patients O2 sat 88% on room air. O2 reapplied at 2 liters by Hugo

## 2014-07-30 NOTE — Progress Notes (Signed)
Oxygen removed at 1120. Patient O2 sat on room air 92-94%. No complaints of shortness of breath noted

## 2014-07-30 NOTE — Clinical Social Work Note (Signed)
Clinical Social Work Department CLINICAL SOCIAL WORK PLACEMENT NOTE 07/30/2014  Patient:  [name],[name]  Account Number:  [id number] Admit date:  07/25/2014  Clinical Social Worker:  Lupita LeashNNA CROWDER, LCSW  Date/time:  07/29/2014 12:10 PM  Clinical Social Work is seeking post-discharge placement for this patient at the following level of care:   SKILLED NURSING   (*CSW will update this form in Epic as items are completed)   07/30/2014  Patient/family provided with [name] Department of Clinical Social Work's list of facilities offering this level of care within the geographic area requested by the patient (or if unable, by the patient's family).  07/30/2014  Patient/family informed of their freedom to choose among providers that offer the needed level of care, that participate in Medicare, Medicaid or managed care program needed by the patient, have an available bed and are willing to accept the patient.  07/30/2014  Patient/family informed of [facility]' ownership interest in [facility], as well as of the fact that they are under no obligation to receive care at this facility.  PASARR submitted to EDS on 07/30/2014 PASARR number received on   FL2 transmitted to all facilities in geographic area requested by pt/family on  07/30/2014 FL2 transmitted to all facilities within larger geographic area on   Patient informed that his/her managed care company has contracts with or will negotiate with  certain facilities, including the following:     Patient/family informed of bed offers received:   Patient chooses bed at  Physician recommends and patient chooses bed at    Patient to be transferred to  on   Patient to be transferred to facility by  Patient and family notified of transfer on  Name of family member notified:    The following physician request were entered in Epic: Physician Request  Please sign FL2.  Please prepare priority discharge summary and  prescriptions.    Additional Comments:  Santa GeneraAnne Cunningham, LCSW Clinical Social Worker [phone])

## 2014-07-30 NOTE — Clinical Social Work Note (Signed)
CSW spoke w son, Marikay AlarDavid Laningham ([phone]) re recommendation that patient pursue SNF rehab.  [facility] assessed and felt they were unable to manage patient in her current condition.  Did not think they could handle patients care needs at present.  Son wants patient placed in SNF [place], [place] [facility].  CSW faxed [facility] out to [facility] SNFs and placed call to [facility] [place] to inquire about bed availability.  Son requests follow up phone call re SNF placement as he assists w patient finances.  Santa GeneraAnne Mouhamadou Gittleman, LCSW Clinical Social Worker [phone])

## 2014-07-30 NOTE — Progress Notes (Signed)
[facility] PROGRESS NOTE  [name] HYQ:[id number] DOB: 03/08/1920 DOA: 07/25/2014 PCP: Milinda AntisURHAM, KAWANTA, MD  Assessment/Plan: 1. Hypoxia likely secondary to acutely decompensated diastolic CHF  1. Initially cont scheduled IV lasix 2. Transitioned to home PO lasix on 10/2 3. Net neg 3.4L this AM 4. Follow daily weights - 44.3g today 5. Follow renal function closely 6. 2d echo in 04/2014 revealing normal EF, restrictive diastolic function 7. Pt currently on 40mg  po bid lasix 8. Currently on min O2 support 2. Hypothyroid  1. Resume thyroid meds 2. TSH normal 3. Chronic anemia  1. Follow CBC  2. Baseline hgb seems to be just under 10 4. HTN  1. BP stable 2. Cont home meds for now 5. CAD  1. Stable 2. Asymptomatic 6. Hx Afib  1. Remains rate controlled 2. Cont home meds 7. DVT prophylaxis  1. Heparin subQ 8. Skin breakdown  1. Consulted wound care. Recs noted.  Code Status: DNR Family Communication: Pt in room Disposition Plan: Plan d/c to snf hopefully soon  Consultants:  None  Procedures:    Antibiotics:    HPI/Subjective: Reports mild sob early this AM, but has since resolved.  Objective: Filed Vitals:   07/30/14 0541 07/30/14 0623 07/30/14 1154 07/30/14 1347  BP: 132/71   142/48  Pulse: 66   59  Temp: 98.9 F (37.2 C)   98.1 F (36.7 C)  TempSrc: Oral     Resp: 16   18  Height:      Weight:  44.3 kg (97 lb 10.6 oz)    SpO2: 97%  92% 92%    Intake/Output Summary (Last 24 hours) at 07/30/14 1538 Last data filed at 07/29/14 1915  Gross per 24 hour  Intake    240 ml  Output      0 ml  Net    240 ml   Filed Weights   07/28/14 0600 07/29/14 0642 07/30/14 0623  Weight: 44.8 kg (98 lb 12.3 oz) 44 kg (97 lb) 44.3 kg (97 lb 10.6 oz)   Exam:   General:  Awake, in nad  Cardiovascular: regular, s1, s2  Respiratory: normal resp effort, no wheezing  Abdomen: soft, nondistended  Musculoskeletal: perfused, no clubbing   Data  Reviewed: Basic Metabolic Panel:  Recent Labs Lab 07/26/14 0612 07/27/14 0639 07/28/14 0636 07/29/14 0559 07/30/14 0618  NA 140 140 140 141 142  K 3.1* 4.5 4.2 4.0 4.0  CL 98 101 98 99 100  CO2 29 27 29 31 30   GLUCOSE 96 133* 84 92 88  BUN 16 16 23 23 21   CREATININE 1.22* 1.07 1.23* 1.22* 1.21*  CALCIUM 8.3* 8.7 8.7 8.7 8.9   Liver Function Tests:  Recent Labs Lab 07/25/14 1129 07/26/14 0612  AST 21 23  ALT 17 17  ALKPHOS 110 120*  BILITOT 0.4 0.5  PROT 7.0 7.2  ALBUMIN 2.4* 2.3*   No results found for this basename: LIPASE, AMYLASE,  in the last 168 hours No results found for this basename: AMMONIA,  in the last 168 hours CBC:  Recent Labs Lab 07/25/14 1129 07/26/14 0612  WBC 10.0 11.0*  NEUTROABS 8.9*  --   HGB 8.9* 9.3*  HCT 27.5* 29.3*  MCV 84.1 83.7  PLT 385 444*   Cardiac Enzymes: No results found for this basename: CKTOTAL, CKMB, CKMBINDEX, TROPONINI,  in the last 168 hours BNP (last 3 results)  Recent Labs  07/25/14 1129  PROBNP 2481.0*   CBG: No results found for  this basename: GLUCAP,  in the last 168 hours  Recent Results (from the past 240 hour(s))  MRSA PCR SCREENING     Status: Abnormal   Collection Time    07/25/14  6:45 PM      Result Value Ref Range Status   MRSA by PCR POSITIVE (*) NEGATIVE Final   Comment:            The GeneXpert MRSA Assay (FDA     approved for NASAL specimens     only), is one component of a     comprehensive MRSA colonization     surveillance program. It is not     intended to diagnose MRSA     infection nor to guide or     monitor treatment for     MRSA infections.     CRITICAL RESULT CALLED TO, READ BACK BY AND VERIFIED WITH:     SEIGLA,J AT 9:51PM ON 07/25/2014 BY ISLEY,B     Studies: Dg Chest Port 1 View  07/29/2014   CLINICAL DATA:  Pt states she had episode of anxiety yesterday and today, w/ cp, sob. Pt is admitted for acutely decompensated diastolic CHF, hx cad, htn, afib, dvt  EXAM:  PORTABLE CHEST - 1 VIEW  COMPARISON:  07/25/2014  FINDINGS: Midline trachea. Mild cardiomegaly. Small left pleural effusion is similar. No pneumothorax. Minimal improvement aeration. Lower lobe predominant interstitial and airspace disease persists. Slightly greater on the left.  IMPRESSION: Slightly improved aeration. Favor pulmonary edema. Infection or aspiration could look similar, especially at the left lung base.  Similar small left pleural effusion.   Electronically Signed   By: Jeronimo Greaves M.D.   On: 07/29/2014 17:35    Scheduled Meds: . amiodarone  200 mg Oral Daily  . amLODipine  5 mg Oral Daily  . antiseptic oral rinse  7 mL Mouth Rinse BID  . aspirin EC  81 mg Oral BH-q7a  . cloNIDine  0.1 mg Oral QHS  . collagenase   Topical Daily  . feeding supplement (ENSURE COMPLETE)  237 mL Oral BID BM  . ferrous sulfate  325 mg Oral BID WC  . furosemide  40 mg Oral BID  . heparin  5,000 Units Subcutaneous 3 times per day  . levothyroxine  50 mcg Oral QAC breakfast  . rOPINIRole  0.5 mg Oral QHS  . sodium chloride  3 mL Intravenous Q12H  . venlafaxine  37.5 mg Oral Daily   Continuous Infusions:   Principal Problem:   Diastolic CHF Active Problems:   HYPOTHYROIDISM   ANEMIA, NORMOCYTIC, CHRONIC   HYPERTENSION   CORONARY ARTERY DISEASE   Atrial fibrillation   Dyspnea   Acute diastolic CHF (congestive heart failure)  Time spent:  Christepher Melchior K  [facility] Pager [phone]. If 7PM-7AM, please contact night-coverage at [email or web address], password [facility] 07/30/2014, 3:38 PM  LOS: 5 days

## 2014-07-31 DIAGNOSIS — E43 Unspecified severe protein-calorie malnutrition: Secondary | ICD-10-CM | POA: Insufficient documentation

## 2014-07-31 LAB — BASIC METABOLIC PANEL
ANION GAP: 13 (ref 5–15)
BUN: 23 mg/dL (ref 6–23)
CHLORIDE: 97 meq/L (ref 96–112)
CO2: 31 mEq/L (ref 19–32)
Calcium: 8.9 mg/dL (ref 8.4–10.5)
Creatinine, Ser: 1.22 mg/dL — ABNORMAL HIGH (ref 0.50–1.10)
GFR calc non Af Amer: 38 mL/min — ABNORMAL LOW (ref >90)
GFR, EST AFRICAN AMERICAN: 44 mL/min — AB (ref >90)
Glucose, Bld: 95 mg/dL (ref 70–99)
Potassium: 3.8 mEq/L (ref 3.7–5.3)
Sodium: 141 mEq/L (ref 137–147)

## 2014-07-31 MED ORDER — FUROSEMIDE 40 MG PO TABS: 40.0000 mg | ORAL_TABLET | Freq: Two times a day (BID) | ORAL | Status: AC

## 2014-07-31 MED ORDER — BOOST / RESOURCE BREEZE PO LIQD
1.0000 | Freq: Three times a day (TID) | ORAL | Status: DC
  Administered 2014-07-31: 1 via ORAL

## 2014-07-31 MED ORDER — TRAMADOL HCL 50 MG PO TABS: ORAL_TABLET | ORAL | Status: AC

## 2014-07-31 NOTE — Progress Notes (Addendum)
NUTRITION FOLLOW UP  Pt meets criteria for severe MALNUTRITION in the context of chronic illness as evidenced by severe fat and muscle depletion.  Intervention:   -D/c Ensure Completed BID, due to pt preference -Resource Breeze po TID, each supplement provides 250 kcal and 9 grams of protein  Nutrition Dx:   Inadequate oral intake related to decreased appetite as evidenced by PO: 25%; ongoing  Goal:   Pt will meet >90% of estimated nutritional needs  Monitor:   PO/supplement intake, labs, weight changes, I/O's  Assessment:   [name] is a 80 y.o. female with a hx of diastolic chf, htn, hypothyroid, CAD who presents to ED with complaints of SOB. In the ED, the patient was noted to be hypoxic with o2 sats in the 80's. She was found to have an elevated BNP of 2481 and cxr with B interstitial and alveolar airspace opacities suggestive of pulm edema vs pneumonitis. The patient was given one dose of IV lasix and the hospitalist service consulted for admission. On further questioning, pt reports increased SOB laying down and improved when sitting up.  Pt reports no improvement in her appetite. Intake has increased slightly (PO:25-50%).  She reports that she does not care for the Ensure supplement, as she does not like the flavor. Noted refusals x 2 yesterday. Pt agreeable to try another supplement- reviewed available options- pt agreeable to [employer] Breeze.  Educated pt on importance of good PO intake to promote healing process. Also discussed importance of good protein and supplement intake to promote wound healing.  Noted a 5% wt loss x 5 days. Use of diuretics may be contributory to weight loss.  Discharge disposition is to SNF [facility]), as pt requires a higher level of care than ALF, from where she was admitted.  Unable to preform full exam, due to pt with multiple bandages from chronic leg wounds.  Labs reviewed. Creat: 1.22. Glucose and K WDL.   Nutrition Focused  Physical Exam:  Subcutaneous Fat:  Orbital Region: severe depletion Upper Arm Region: severe depletion Thoracic and Lumbar Region: severe depletion  Muscle:  Temple Region: severe depletion Clavicle Bone Region: severe depletion Clavicle and Acromion Bone Region: severe depletion Scapular Bone Region: severe depletion Dorsal Hand: severe depletion Patellar Region: n/a Anterior Thigh Region: n/a Posterior Calf Region: n/a  Edema: none present  Height: Ht Readings from Last 1 Encounters:  07/25/14 4\' 9"  (1.448 m)    Weight Status:   Wt Readings from Last 1 Encounters:  07/31/14 94 lb 9.2 oz (42.9 kg)  07/26/14  99 lb 3.3 oz (45 kg)   Re-estimated needs:  Kcal: [id number] Protein: 56-66 grams Fluid: 1.3-1.5 L  Skin: skin tear lt upper arm, open wound on left toe, open wound rt ankle, open wound on rt ankle, left ankle skin tear, open wound on rt knee   Diet Order: Cardiac   Intake/Output Summary (Last 24 hours) at 07/31/14 0958 Last data filed at 07/30/14 2129  Gross per 24 hour  Intake    123 ml  Output      0 ml  Net    123 ml    Last BM: 07/30/14   Labs:   Recent Labs Lab 07/29/14 0559 07/30/14 0618 07/31/14 0558  NA 141 142 141  K 4.0 4.0 3.8  CL 99 100 97  CO2 31 30 31   BUN 23 21 23   CREATININE 1.22* 1.21* 1.22*  CALCIUM 8.7 8.9 8.9  GLUCOSE 92 88 95  CBG (last 3)  No results found for this basename: GLUCAP,  in the last 72 hours  Scheduled Meds: . amiodarone  200 mg Oral Daily  . amLODipine  5 mg Oral Daily  . antiseptic oral rinse  7 mL Mouth Rinse BID  . aspirin EC  81 mg Oral BH-q7a  . cloNIDine  0.1 mg Oral QHS  . collagenase   Topical Daily  . feeding supplement (ENSURE COMPLETE)  237 mL Oral BID BM  . ferrous sulfate  325 mg Oral BID WC  . furosemide  40 mg Oral BID  . heparin  5,000 Units Subcutaneous 3 times per day  . levothyroxine  50 mcg Oral QAC breakfast  . rOPINIRole  0.5 mg Oral QHS  . sodium chloride  3 mL  Intravenous Q12H  . venlafaxine  37.5 mg Oral Daily    Continuous Infusions:   Traeh Milroy A. Mayford Knife, RD, LDN Pager: [phone]

## 2014-07-31 NOTE — Clinical Social Work Placement (Addendum)
Clinical Social Work Department CLINICAL SOCIAL WORK PLACEMENT NOTE 07/31/2014  Patient:  [name],[name]  Account Number:  [id number] Admit date:  07/25/2014  Clinical Social Worker:  Lupita LeashNNA CROWDER, LCSW  Date/time:  07/29/2014 12:10 PM  Clinical Social Work is seeking post-discharge placement for this patient at the following level of care:   SKILLED NURSING   (*CSW will update this form in Epic as items are completed)   07/30/2014  Patient/family provided with [name] Department of Clinical Social Work's list of facilities offering this level of care within the geographic area requested by the patient (or if unable, by the patient's family).  07/30/2014  Patient/family informed of their freedom to choose among providers that offer the needed level of care, that participate in Medicare, Medicaid or managed care program needed by the patient, have an available bed and are willing to accept the patient.  07/30/2014  Patient/family informed of [facility]' ownership interest in [facility], as well as of the fact that they are under no obligation to receive care at this facility.  PASARR submitted to EDS on 07/30/2014 PASARR number received on   FL2 transmitted to all facilities in geographic area requested by pt/family on  07/30/2014 FL2 transmitted to all facilities within larger geographic area on   Patient informed that his/her managed care company has contracts with or will negotiate with  certain facilities, including the following:     Patient/family informed of bed offers received:  07/31/2014 [facility] Physician recommends and patient chooses bed [facility]  [facility] on  07/31/2014 Patient to be transferred to facility by facility [name] Patient and family notified of transfer on 07/31/2014 Name of family member notified:  [name]- son  The following physician request were  entered in Epic:   Additional Comments:  Derenda FennelKara Ethelreda Sukhu, LCSW [phone]

## 2014-07-31 NOTE — Discharge Summary (Addendum)
Physician Discharge Summary  [name]:[id number] DOB: [birth year] DOA: 07/25/2014  PCP: Milinda Antis, MD  Admit date: 07/25/2014 Discharge date: 07/31/2014  Time spent: 35 minutes  Recommendations for Outpatient Follow-up:  1. Follow up basic metabolic panel in 1-2 weeks 2. Follow up with PCP in 1-2 weeks 3. Wound Care Recs:  1. Cleanse bilateral legs with NS and pat gently dry.  2. Apply Santyl ointment, 1/8 inch thickness (opaque) to open/necrotic areas.  3. Top with vaseline gauze.  4. Secure with kerlix and tape.   Discharge Diagnoses:  Principal Problem:   Diastolic CHF Active Problems:   HYPOTHYROIDISM   ANEMIA, NORMOCYTIC, CHRONIC   HYPERTENSION   CORONARY ARTERY DISEASE   Atrial fibrillation   Dyspnea   Acute diastolic CHF (congestive heart failure)   Discharge Condition: Improved  Diet recommendation: Heart healthy  Filed Weights   07/29/14 0642 07/30/14 0623 07/31/14 0412  Weight: 44 kg (97 lb) 44.3 kg (97 lb 10.6 oz) 42.9 kg (94 lb 9.2 oz)    History of present illness:  Please see admit h and p from 9/30 for details. Briefly, pt presents with sob with an elevated BNP of 2481 and imaging findings suggestive of pulm edema. The patient was admitted for further work up.  Hospital Course:  1. Hypoxia likely secondary to acutely decompensated diastolic CHF  1. Initially continued on scheduled IV lasix with good improvement 2. Patient was ultimately transitioned to home PO lasix on 10/2 3. Net neg 3.0L this AM 4. Followed daily weights - 42g today 5. Followed renal function closely 6. 2d echo in 04/2014 revealing normal EF, restrictive diastolic function 7. Pt currently continued on 40mg  po bid lasix (home dose was 20mg  BID) 8. Patient is currently on min O2 support on day of discharge 2. Hypothyroid  1. Resumed thyroid meds 2. TSH normal 3. Chronic anemia  1. Stable CBC  2. Baseline hgb seems to be just under 10 4. HTN  1. BP stable 2. Cont  home meds for now 5. CAD  1. Stable 2. Asymptomatic 6. Hx Afib  1. Remains rate controlled 2. Cont home meds 7. DVT prophylaxis  1. Heparin subQ 8. Skin breakdown  1. Consulted wound care. Recs noted. See above 9. Severe protein calorie malnutrition  Consultations:  Wound Care  Discharge Exam: Filed Vitals:   07/30/14 1347 07/30/14 2132 07/31/14 0412 07/31/14 0612  BP: 142/48 150/45  116/54  Pulse: 59 65  56  Temp: 98.1 F (36.7 C)   97.5 F (36.4 C)  TempSrc:    Oral  Resp: 18 20  20   Height:      Weight:   42.9 kg (94 lb 9.2 oz)   SpO2: 92% 99%  98%    General: Awake, in nad Cardiovascular: regular, s1, s2 Respiratory: normal resp effort, no wheezing  Discharge Instructions     Medication List    ASK your doctor about these medications       albuterol 108 (90 BASE) MCG/ACT inhaler  Commonly known as:  PROVENTIL HFA;VENTOLIN HFA  Inhale 2 puffs into the lungs every 8 (eight) hours as needed for wheezing or shortness of breath.     amiodarone 200 MG tablet  Commonly known as:  PACERONE  Take 1 tablet (200 mg total) by mouth daily.     amLODipine 5 MG tablet  Commonly known as:  NORVASC  Take 1 tablet (5 mg total) by mouth daily.     aspirin EC 81  MG tablet  Take 81 mg by mouth every morning.     CALTRATE 600 PLUS-VIT D PO  Take 1 tablet by mouth 2 (two) times daily.     cloNIDine 0.1 MG tablet  Commonly known as:  CATAPRES  Take 1 tablet (0.1 mg total) by mouth at bedtime.     ferrous sulfate 325 (65 FE) MG tablet  Take 1 tablet (325 mg total) by mouth 2 (two) times daily with a meal.     furosemide 20 MG tablet  Commonly known as:  LASIX  Take 20 mg by mouth 2 (two) times daily.     gabapentin 300 MG capsule  Commonly known as:  NEURONTIN  TAKE (1) CAPSULE BY MOUTH IN THE MORNING AND (2) CAPSULES AT BEDTIME.     levothyroxine 50 MCG tablet  Commonly known as:  SYNTHROID, LEVOTHROID  TAKE ONE TABLET DAILY.     polyethylene glycol packet   Commonly known as:  MIRALAX / GLYCOLAX  Take 17 g by mouth daily as needed for mild constipation.     rOPINIRole 0.5 MG tablet  Commonly known as:  REQUIP  Take 0.5 mg by mouth at bedtime.     SENIOR MULTIVITAMIN PLUS Tabs  Take 1 tablet by mouth every morning.     SYSTANE OP  Apply 2 drops to eye daily.     traMADol 50 MG tablet  Commonly known as:  ULTRAM  Take one tablet by mouth twice daily as needed for pain     venlafaxine 37.5 MG tablet  Commonly known as:  EFFEXOR  TAKE ONE TABLET DAILY.       Allergies  Allergen Reactions  . Acetaminophen-Codeine     REACTION: causes nausea  . Sulfa Antibiotics Other (See Comments)    Makes her 'sick'      The results of significant diagnostics from this hospitalization (including imaging, microbiology, ancillary and laboratory) are listed below for reference.    Significant Diagnostic Studies: Dg Chest Port 1 View  07/29/2014   CLINICAL DATA:  Pt states she had episode of anxiety yesterday and today, w/ cp, sob. Pt is admitted for acutely decompensated diastolic CHF, hx cad, htn, afib, dvt  EXAM: PORTABLE CHEST - 1 VIEW  COMPARISON:  07/25/2014  FINDINGS: Midline trachea. Mild cardiomegaly. Small left pleural effusion is similar. No pneumothorax. Minimal improvement aeration. Lower lobe predominant interstitial and airspace disease persists. Slightly greater on the left.  IMPRESSION: Slightly improved aeration. Favor pulmonary edema. Infection or aspiration could look similar, especially at the left lung base.  Similar small left pleural effusion.   Electronically Signed   By: Jeronimo GreavesKyle  Talbot M.D.   On: 07/29/2014 17:35   Dg Chest Port 1 View  07/25/2014   CLINICAL DATA:  Acute on chronic shortness of breath  EXAM: PORTABLE CHEST - 1 VIEW  COMPARISON:  06/20/2014  FINDINGS: Small left pleural effusion. Bilateral interstitial and alveolar airspace opacities and enlargement of the central pulmonary vasculature. No pneumothorax. Stable  cardiomegaly. Thoracic aortic atherosclerosis. Unremarkable osseous structures.  IMPRESSION: Small left pleural effusion and bilateral interstitial and alveolar airspace opacities. Differential diagnosis includes pulmonary edema versus pneumonitis.   Electronically Signed   By: Elige KoHetal  Patel   On: 07/25/2014 12:18    Microbiology: Recent Results (from the past 240 hour(s))  MRSA PCR SCREENING     Status: Abnormal   Collection Time    07/25/14  6:45 PM      Result Value Ref Range Status  MRSA by PCR POSITIVE (*) NEGATIVE Final   Comment:            The GeneXpert MRSA Assay (FDA     approved for NASAL specimens     only), is one component of a     comprehensive MRSA colonization     surveillance program. It is not     intended to diagnose MRSA     infection nor to guide or     monitor treatment for     MRSA infections.     CRITICAL RESULT CALLED TO, READ BACK BY AND VERIFIED WITH:     SEIGLA,J AT 9:51PM ON 07/25/2014 BY ISLEY,B     Labs: Basic Metabolic Panel:  Recent Labs Lab 07/27/14 0639 07/28/14 0636 07/29/14 0559 07/30/14 0618 07/31/14 0558  NA 140 140 141 142 141  K 4.5 4.2 4.0 4.0 3.8  CL 101 98 99 100 97  CO2 27 29 31 30 31   GLUCOSE 133* 84 92 88 95  BUN 16 23 23 21 23   CREATININE 1.07 1.23* 1.22* 1.21* 1.22*  CALCIUM 8.7 8.7 8.7 8.9 8.9   Liver Function Tests:  Recent Labs Lab 07/25/14 1129 07/26/14 0612  AST 21 23  ALT 17 17  ALKPHOS 110 120*  BILITOT 0.4 0.5  PROT 7.0 7.2  ALBUMIN 2.4* 2.3*   No results found for this basename: LIPASE, AMYLASE,  in the last 168 hours No results found for this basename: AMMONIA,  in the last 168 hours CBC:  Recent Labs Lab 07/25/14 1129 07/26/14 0612  WBC 10.0 11.0*  NEUTROABS 8.9*  --   HGB 8.9* 9.3*  HCT 27.5* 29.3*  MCV 84.1 83.7  PLT 385 444*   Cardiac Enzymes: No results found for this basename: CKTOTAL, CKMB, CKMBINDEX, TROPONINI,  in the last 168 hours BNP: BNP (last 3 results)  Recent Labs   07/25/14 1129  PROBNP 2481.0*   CBG: No results found for this basename: GLUCAP,  in the last 168 hours  Signed:  CHIU, STEPHEN K  [facility] 07/31/2014, 11:45 AM

## 2014-07-31 NOTE — Progress Notes (Signed)
Myself and Foye Clockkristina the tech have both been in patients room several times and patient is sweating and stickers continue to slide off. I spoke with 2 different people from central telemetry and I explained to them the situation and said i would go check on the leads again but they will not stay and read continuously.  Bennett ScrapeAlicia Wright RN

## 2014-07-31 NOTE — Progress Notes (Signed)
Facility arrived to transport patient.  IV removed - WNL.  [facility]  Report called to facility this afternoon per previous nurse.

## 2014-07-31 NOTE — Clinical Social Work Note (Signed)
Pt d/c today to SNF. Pt and son notified of bed offers and [facility] chosen. Facility aware. Pt's son requests transport via facility van. D/C summary faxed.  Derenda FennelKara Aziz [place], [place] [phone]

## 2014-08-01 ENCOUNTER — Ambulatory Visit: Payer: Medicare Other | Admitting: Family Medicine

## 2014-08-26 DEATH — deceased

## 2014-11-27 IMAGING — CR DG CHEST 2V
2 series · 2 of 2 positions shown · non-contrast
Comparison: Chest radiograph April 29, 2014

CLINICAL DATA: Wheezing, shortness of breath, leg swelling.

EXAM:
CHEST  2 VIEW

[view not recorded (1 of 2)]
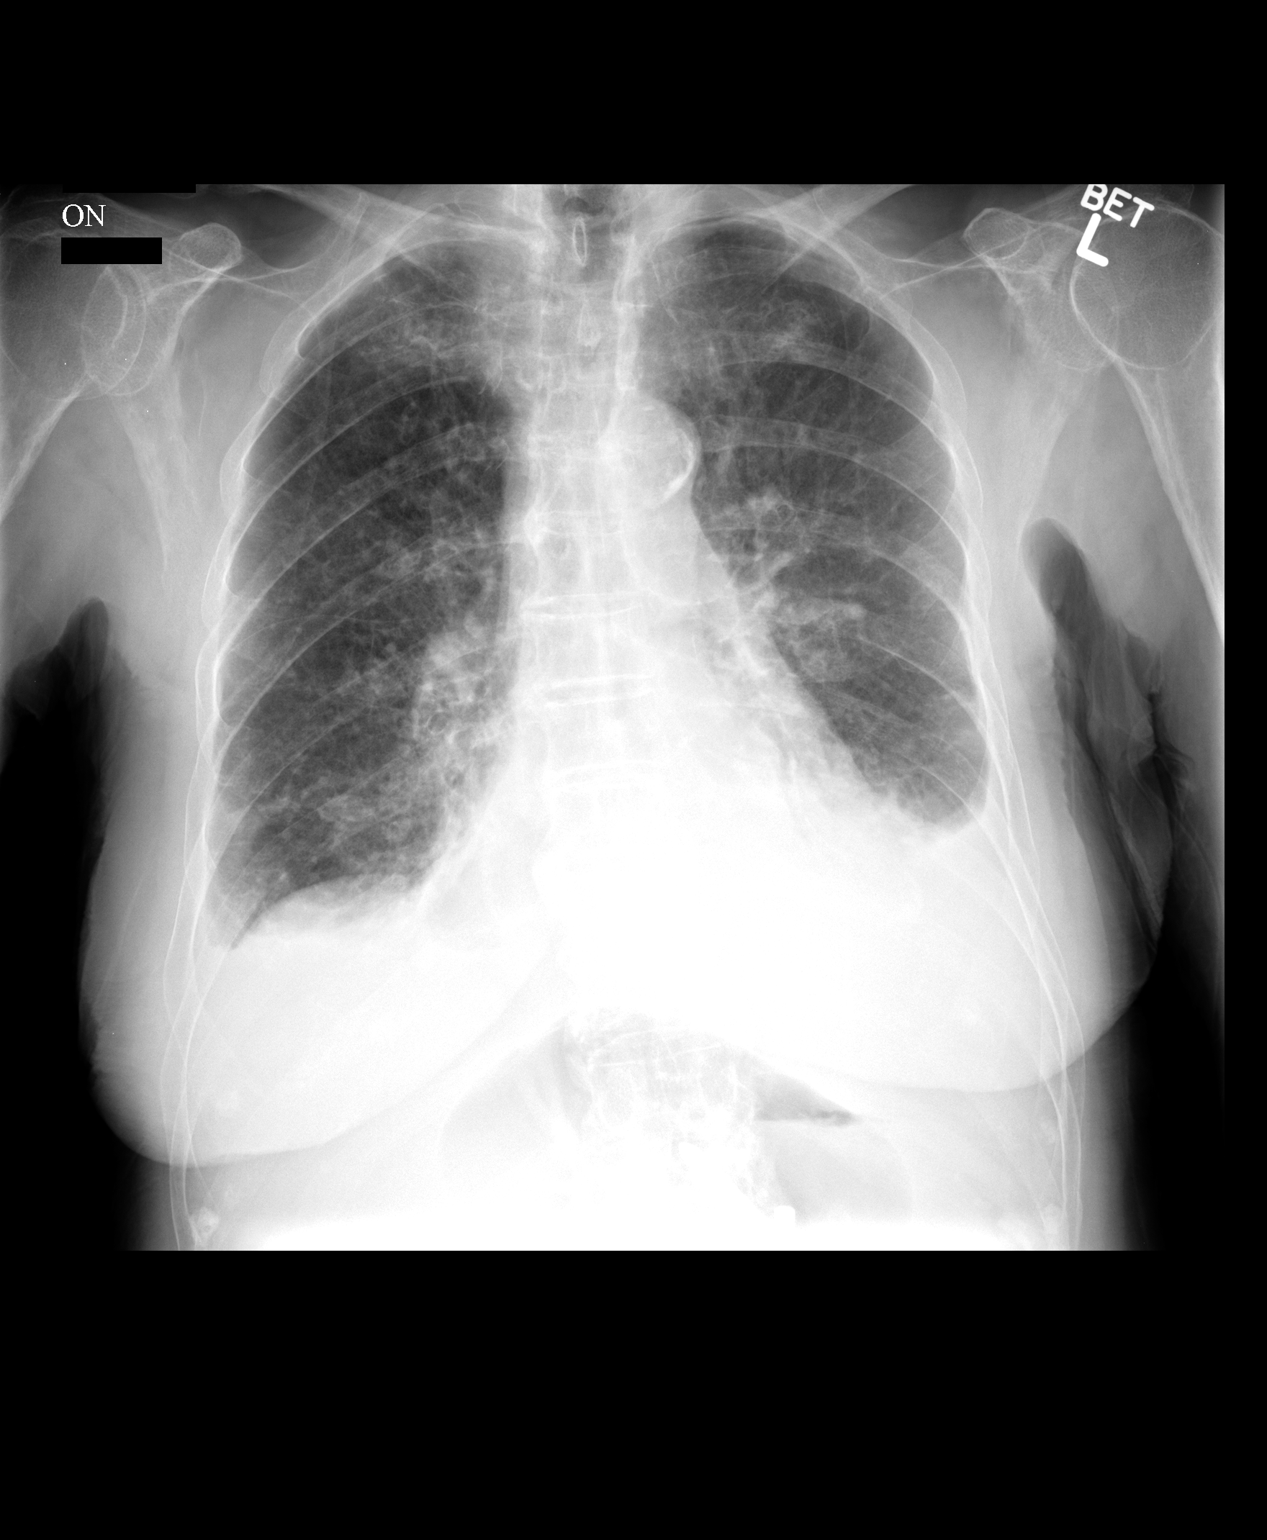

[view not recorded (2 of 2)]
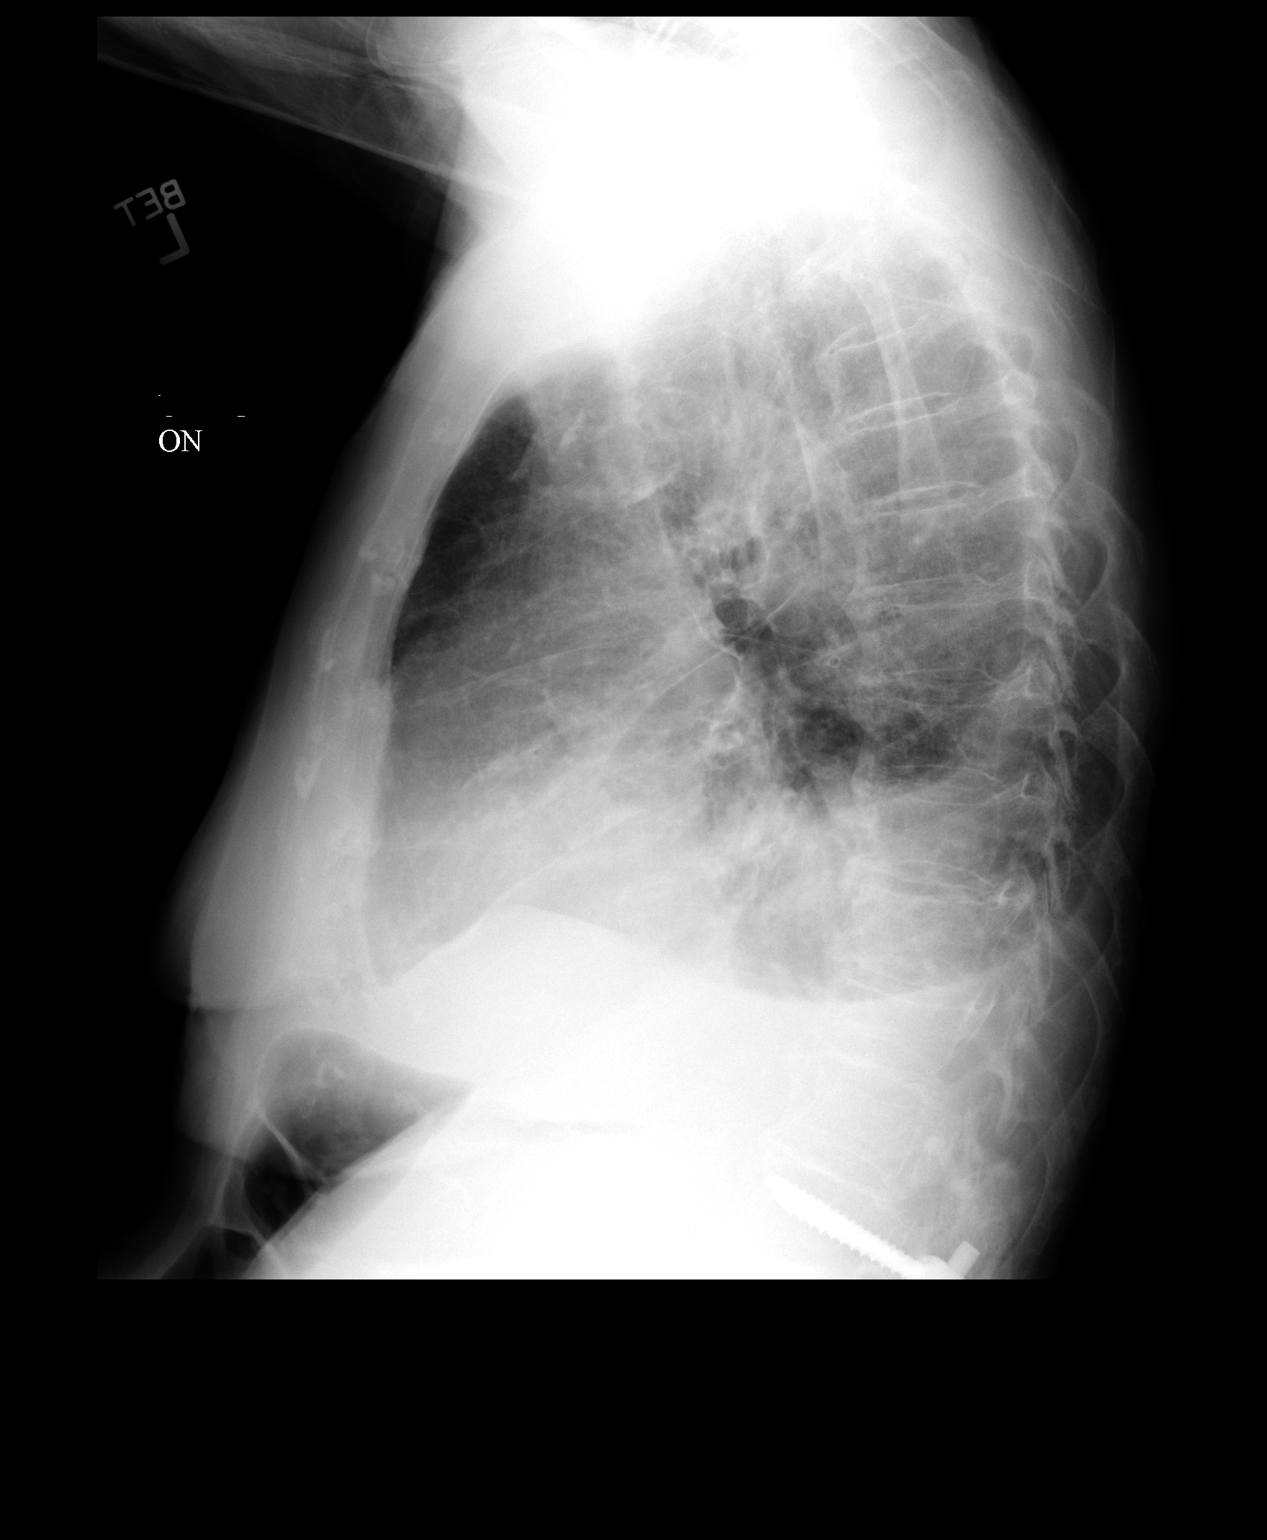

[2 of 2 positions shown; findings below may reference images not displayed]

FINDINGS: The cardiac silhouette is mildly enlarged. Calcified aortic knob,
mediastinal silhouette is nonsuspicious. Decreased pulmonary
vascular congestion, similar diffuse interstitial prominence with
small to moderate left and small right pleural effusion. Left lung
base consolidation, right middle lobe airspace opacity. No
pneumothorax.

Lumbar instrumentation. Osteopenia. Soft tissue planes are
nonsuspicious.
IMPRESSION: Mild cardiomegaly, vascular congestion with similar interstitial
changes. Small to moderate left and small right pleural effusion.
Right middle lobe and retrocardiac consolidation may reflect
confluent edema, atelectasis or even pneumonia. Recommend followup
chest radiograph after treatment to verify improvement.

  By: Madja Hamissi Nurdini

## 2015-01-27 IMAGING — CR DG CHEST 1V PORT
1 series · 1 of 1 positions shown · non-contrast
Comparison: 07/25/2014

CLINICAL DATA: Pt states she had episode of anxiety yesterday and
today, w/ cp, sob. Pt is admitted for acutely decompensated
diastolic CHF, hx cad, htn, afib, dvt

EXAM:
PORTABLE CHEST - 1 VIEW

[portable]
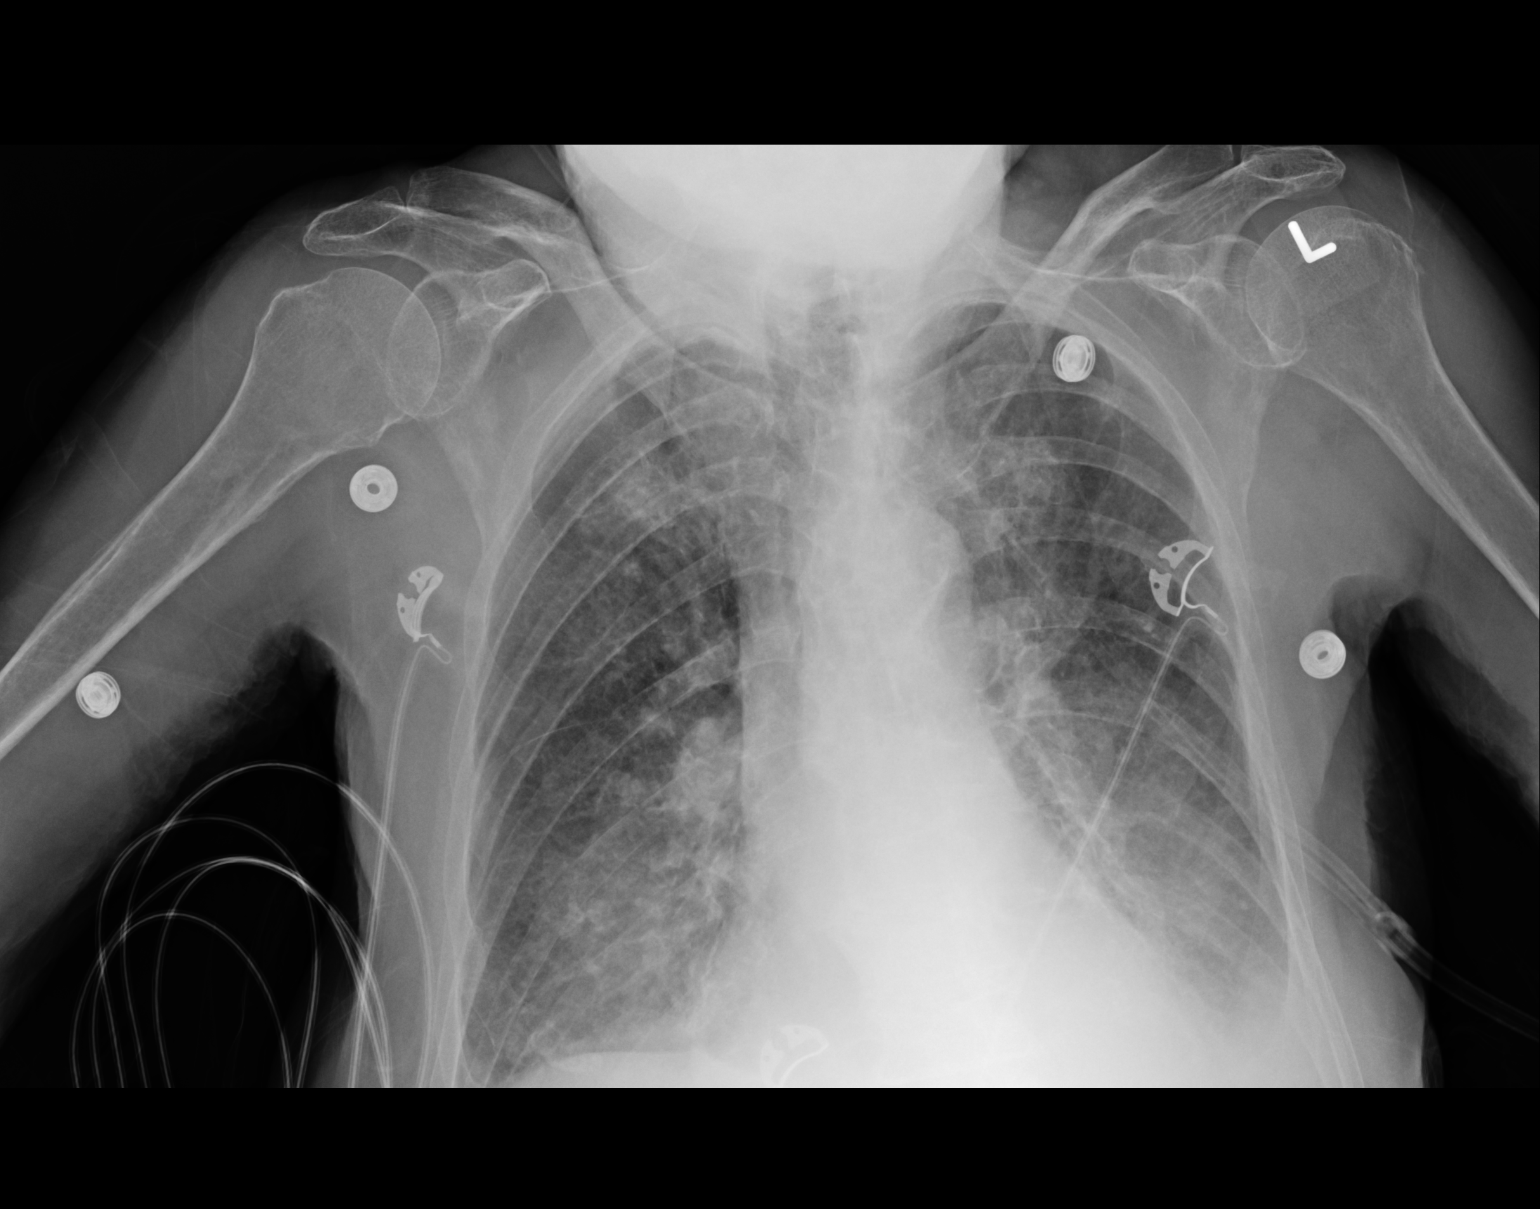

[1 of 1 positions shown; findings below may reference images not displayed]

FINDINGS: Midline trachea. Mild cardiomegaly. Small left pleural effusion is
similar. No pneumothorax. Minimal improvement aeration. Lower lobe
predominant interstitial and airspace disease persists. Slightly
greater on the left.
IMPRESSION: Slightly improved aeration. Favor pulmonary edema. Infection or
aspiration could look similar, especially at the left lung base.

Similar small left pleural effusion.
